# Patient Record
Sex: Male | Born: 1945
Health system: Southern US, Community
[De-identification: ages and names within clinical notes are randomized; demographics above are authoritative.]

## PROBLEM LIST (undated history)

## (undated) DIAGNOSIS — E785 Hyperlipidemia, unspecified: Secondary | ICD-10-CM

## (undated) DIAGNOSIS — K219 Gastro-esophageal reflux disease without esophagitis: Secondary | ICD-10-CM

## (undated) DIAGNOSIS — B019 Varicella without complication: Secondary | ICD-10-CM

## (undated) DIAGNOSIS — J45909 Unspecified asthma, uncomplicated: Secondary | ICD-10-CM

## (undated) DIAGNOSIS — I10 Essential (primary) hypertension: Secondary | ICD-10-CM

## (undated) HISTORY — DX: Gastro-esophageal reflux disease without esophagitis: K21.9

## (undated) HISTORY — DX: Varicella without complication: B01.9

## (undated) HISTORY — PX: CATARACT EXTRACTION: SUR2

## (undated) HISTORY — DX: Unspecified asthma, uncomplicated: J45.909

## (undated) HISTORY — DX: Hyperlipidemia, unspecified: E78.5

## (undated) HISTORY — DX: Essential (primary) hypertension: I10

## (undated) HISTORY — PX: CHOLECYSTECTOMY: SHX55

---

## 2016-11-04 DIAGNOSIS — E785 Hyperlipidemia, unspecified: Secondary | ICD-10-CM

## 2016-11-04 HISTORY — DX: Hyperlipidemia, unspecified: E78.5

## 2016-11-04 LAB — LIPID PANEL
Cholesterol: 209 — AB (ref 0–200)
HDL: 73 — AB (ref 35–70)
LDL Cholesterol: 123
Triglycerides: 65 (ref 40–160)

## 2016-11-04 LAB — HEPATIC FUNCTION PANEL
ALT: 25 (ref 10–40)
AST: 20 (ref 14–40)
Alkaline Phosphatase: 68 (ref 25–125)
Bilirubin, Total: 0.6

## 2016-11-04 LAB — BASIC METABOLIC PANEL
BUN: 11 (ref 4–21)
Creatinine: 0.9 (ref 0.6–1.3)
Glucose: 97
Potassium: 4.7 (ref 3.4–5.3)
Sodium: 140 (ref 137–147)

## 2016-11-04 LAB — TSH: TSH: 2.1 (ref 0.41–5.90)

## 2017-11-17 LAB — BASIC METABOLIC PANEL
BUN: 13 (ref 4–21)
Creatinine: 0.8 (ref 0.6–1.3)
Glucose: 97
Potassium: 5.2 (ref 3.4–5.3)
Sodium: 136 — AB (ref 137–147)

## 2017-11-17 LAB — LIPID PANEL
CHOLESTEROL: 217 — AB (ref 0–200)
HDL: 63 (ref 35–70)
LDL CALC: 134
Triglycerides: 100 (ref 40–160)

## 2017-11-17 LAB — HEPATIC FUNCTION PANEL
ALT: 23 (ref 10–40)
AST: 23 (ref 14–40)
Alkaline Phosphatase: 60 (ref 25–125)
Bilirubin, Total: 0.6

## 2018-05-26 LAB — BASIC METABOLIC PANEL
BUN: 15 (ref 4–21)
Creatinine: 0.9 (ref 0.6–1.3)
Glucose: 95
Potassium: 4.5 (ref 3.4–5.3)
Sodium: 138 (ref 137–147)

## 2018-05-26 LAB — HEPATIC FUNCTION PANEL
ALK PHOS: 63 (ref 25–125)
ALT: 16 (ref 10–40)
AST: 19 (ref 14–40)
Bilirubin, Total: 0.8

## 2018-07-19 ENCOUNTER — Ambulatory Visit (INDEPENDENT_AMBULATORY_CARE_PROVIDER_SITE_OTHER): Payer: Medicare Other | Admitting: Family Medicine

## 2018-07-19 ENCOUNTER — Encounter: Payer: Self-pay | Admitting: Family Medicine

## 2018-07-19 VITALS — BP 130/76 | HR 71 | Ht 67.0 in | Wt 182.4 lb

## 2018-07-19 DIAGNOSIS — G4700 Insomnia, unspecified: Secondary | ICD-10-CM | POA: Insufficient documentation

## 2018-07-19 DIAGNOSIS — I10 Essential (primary) hypertension: Secondary | ICD-10-CM | POA: Diagnosis not present

## 2018-07-19 DIAGNOSIS — R3911 Hesitancy of micturition: Secondary | ICD-10-CM

## 2018-07-19 DIAGNOSIS — R05 Cough: Secondary | ICD-10-CM

## 2018-07-19 DIAGNOSIS — K219 Gastro-esophageal reflux disease without esophagitis: Secondary | ICD-10-CM | POA: Diagnosis not present

## 2018-07-19 DIAGNOSIS — E7800 Pure hypercholesterolemia, unspecified: Secondary | ICD-10-CM | POA: Insufficient documentation

## 2018-07-19 DIAGNOSIS — J45909 Unspecified asthma, uncomplicated: Secondary | ICD-10-CM | POA: Diagnosis not present

## 2018-07-19 DIAGNOSIS — R059 Cough, unspecified: Secondary | ICD-10-CM

## 2018-07-19 DIAGNOSIS — Z0000 Encounter for general adult medical examination without abnormal findings: Secondary | ICD-10-CM

## 2018-07-19 DIAGNOSIS — Z131 Encounter for screening for diabetes mellitus: Secondary | ICD-10-CM | POA: Insufficient documentation

## 2018-07-19 DIAGNOSIS — N401 Enlarged prostate with lower urinary tract symptoms: Secondary | ICD-10-CM

## 2018-07-19 HISTORY — DX: Essential (primary) hypertension: I10

## 2018-07-19 MED ORDER — TAMSULOSIN HCL 0.4 MG PO CAPS: 0.8000 mg | ORAL_CAPSULE | Freq: Every day | ORAL | 0 refills | Status: DC

## 2018-07-19 NOTE — Patient Instructions (Signed)
Health Maintenance After Age 73 After age 73, you are at a higher risk for certain long-term diseases and infections as well as injuries from falls. Falls are a major cause of broken bones and head injuries in people who are older than age 73. Getting regular preventive care can help to keep you healthy and well. Preventive care includes getting regular testing and making lifestyle changes as recommended by your health care provider. Talk with your health care provider about:  Which screenings and tests you should have. A screening is a test that checks for a disease when you have no symptoms.  A diet and exercise plan that is right for you. What should I know about screenings and tests to prevent falls? Screening and testing are the best ways to find a health problem early. Early diagnosis and treatment give you the best chance of managing medical conditions that are common after age 73. Certain conditions and lifestyle choices may make you more likely to have a fall. Your health care provider may recommend:  Regular vision checks. Poor vision and conditions such as cataracts can make you more likely to have a fall. If you wear glasses, make sure to get your prescription updated if your vision changes.  Medicine review. Work with your health care provider to regularly review all of the medicines you are taking, including over-the-counter medicines. Ask your health care provider about any side effects that may make you more likely to have a fall. Tell your health care provider if any medicines that you take make you feel dizzy or sleepy.  Osteoporosis screening. Osteoporosis is a condition that causes the bones to get weaker. This can make the bones weak and cause them to break more easily.  Blood pressure screening. Blood pressure changes and medicines to control blood pressure can make you feel dizzy.  Strength and balance checks. Your health care provider may recommend certain tests to check your  strength and balance while standing, walking, or changing positions.  Foot health exam. Foot pain and numbness, as well as not wearing proper footwear, can make you more likely to have a fall.  Depression screening. You may be more likely to have a fall if you have a fear of falling, feel emotionally low, or feel unable to do activities that you used to do.  Alcohol use screening. Using too much alcohol can affect your balance and may make you more likely to have a fall. What actions can I take to lower my risk of falls? General instructions  Talk with your health care provider about your risks for falling. Tell your health care provider if: ? You fall. Be sure to tell your health care provider about all falls, even ones that seem minor. ? You feel dizzy, sleepy, or off-balance.  Take over-the-counter and prescription medicines only as told by your health care provider. These include any supplements.  Eat a healthy diet and maintain a healthy weight. A healthy diet includes low-fat dairy products, low-fat (lean) meats, and fiber from whole grains, beans, and lots of fruits and vegetables. Home safety  Remove any tripping hazards, such as rugs, cords, and clutter.  Install safety equipment such as grab bars in bathrooms and safety rails on stairs.  Keep rooms and walkways well-lit. Activity   Follow a regular exercise program to stay fit. This will help you maintain your balance. Ask your health care provider what types of exercise are appropriate for you.  If you need a cane or   walker, use it as recommended by your health care provider.  Wear supportive shoes that have nonskid soles. Lifestyle  Do not drink alcohol if your health care provider tells you not to drink.  If you drink alcohol, limit how much you have: ? 0-1 drink a day for women. ? 0-2 drinks a day for men.  Be aware of how much alcohol is in your drink. In the [place] one drink equals one typical bottle of beer (12  oz), one-half glass of wine (5 oz), or one shot of hard liquor (1 oz).  Do not use any products that contain nicotine or tobacco, such as cigarettes and e-cigarettes. If you need help quitting, ask your health care provider. Summary  Having a healthy lifestyle and getting preventive care can help to protect your health and wellness after age 73.  Screening and testing are the best way to find a health problem early and help you avoid having a fall. Early diagnosis and treatment give you the best chance for managing medical conditions that are more common for people who are older than age 73.  Falls are a major cause of broken bones and head injuries in people who are older than age 73. Take precautions to prevent a fall at home.  Work with your health care provider to learn what changes you can make to improve your health and wellness and to prevent falls. This information is not intended to replace advice given to you by your health care provider. Make sure you discuss any questions you have with your health care provider. Document Released: 04/08/2017 Document Revised: 04/08/2017 Document Reviewed: 04/08/2017 Elsevier Interactive Patient Education  2019 [employer]  Benign Prostatic Hyperplasia  Benign prostatic hyperplasia (BPH) is an enlarged prostate gland that is caused by the normal aging process and not by cancer. The prostate is a walnut-sized gland that is involved in the production of semen. It is located in front of the rectum and below the bladder. The bladder stores urine and the urethra is the tube that carries the urine out of the body. The prostate may get bigger as a man gets older. An enlarged prostate can press on the urethra. This can make it harder to pass urine. The build-up of urine in the bladder can cause infection. Back pressure and infection may progress to bladder damage and kidney (renal) failure. What are the causes? This condition is part of a normal aging  process. However, not all men develop problems from this condition. If the prostate enlarges away from the urethra, urine flow will not be blocked. If it enlarges toward the urethra and compresses it, there will be problems passing urine. What increases the risk? This condition is more likely to develop in men over the age of 50 years. What are the signs or symptoms? Symptoms of this condition include:  Getting up often during the night to urinate.  Needing to urinate frequently during the day.  Difficulty starting urine flow.  Decrease in size and strength of your urine stream.  Leaking (dribbling) after urinating.  Inability to pass urine. This needs immediate treatment.  Inability to completely empty your bladder.  Pain when you pass urine. This is more common if there is also an infection.  Urinary tract infection (UTI). How is this diagnosed? This condition is diagnosed based on your medical history, a physical exam, and your symptoms. Tests will also be done, such as:  A post-void bladder scan. This measures any amount of urine  that may remain in your bladder after you finish urinating.  A digital rectal exam. In a rectal exam, your health care provider checks your prostate by putting a lubricated, gloved finger into your rectum to feel the back of your prostate gland. This exam detects the size of your gland and any abnormal lumps or growths.  An exam of your urine (urinalysis).  A prostate specific antigen (PSA) screening. This is a blood test used to screen for prostate cancer.  An ultrasound. This test uses sound waves to electronically produce a picture of your prostate gland. Your health care provider may refer you to a specialist in kidney and prostate diseases (urologist). How is this treated? Once symptoms begin, your health care provider will monitor your condition (active surveillance or watchful waiting). Treatment for this condition will depend on the severity  of your condition. Treatment may include:  Observation and yearly exams. This may be the only treatment needed if your condition and symptoms are mild.  Medicines to relieve your symptoms, including: ? Medicines to shrink the prostate. ? Medicines to relax the muscle of the prostate.  Surgery in severe cases. Surgery may include: ? Prostatectomy. In this procedure, the prostate tissue is removed completely through an open incision or with a laparascope or robotics. ? Transurethral resection of the prostate (TURP). In this procedure, a tool is inserted through the opening at the tip of the penis (urethra). It is used to cut away tissue of the inner core of the prostate. The pieces are removed through the same opening of the penis. This removes the blockage. ? Transurethral incision (TUIP). In this procedure, small cuts are made in the prostate. This lessens the prostate's pressure on the urethra. ? Transurethral microwave thermotherapy (TUMT). This procedure uses microwaves to create heat. The heat destroys and removes a small amount of prostate tissue. ? Transurethral needle ablation (TUNA). This procedure uses radio frequencies to destroy and remove a small amount of prostate tissue. ? Interstitial laser coagulation (ILC). This procedure uses a laser to destroy and remove a small amount of prostate tissue. ? Transurethral electrovaporization (TUVP). This procedure uses electrodes to destroy and remove a small amount of prostate tissue. ? Prostatic urethral lift. This procedure inserts an implant to push the lobes of the prostate away from the urethra. Follow these instructions at home:  Take over-the-counter and prescription medicines only as told by your health care provider.  Monitor your symptoms for any changes. Contact your health care provider with any changes.  Avoid drinking large amounts of liquid before going to bed or out in public.  Avoid or reduce how much caffeine or alcohol  you drink.  Give yourself time when you urinate.  Keep all follow-up visits as told by your health care provider. This is important. Contact a health care provider if:  You have unexplained back pain.  Your symptoms do not get better with treatment.  You develop side effects from the medicine you are taking.  Your urine becomes very dark or has a bad smell.  Your lower abdomen becomes distended and you have trouble passing your urine. Get help right away if:  You have a fever or chills.  You suddenly cannot urinate.  You feel lightheaded, or very dizzy, or you faint.  There are large amounts of blood or clots in the urine.  Your urinary problems become hard to manage.  You develop moderate to severe low back or flank pain. The flank is the side of  your body between the ribs and the hip. These symptoms may represent a serious problem that is an emergency. Do not wait to see if the symptoms will go away. Get medical help right away. Call your local emergency services (911 in the [place] Do not drive yourself to the hospital. Summary  Benign prostatic hyperplasia (BPH) is an enlarged prostate that is caused by the normal aging process and not by cancer.  An enlarged prostate can press on the urethra. This can make it hard to pass urine.  This condition is part of a normal aging process and is more likely to develop in men over the age of 50 years.  Get help right away if you suddenly cannot urinate. This information is not intended to replace advice given to you by your health care provider. Make sure you discuss any questions you have with your health care provider. Document Released: 05/26/2005 Document Revised: 06/30/2016 Document Reviewed: 06/30/2016 Elsevier Interactive Patient Education  2019 [employer].

## 2018-07-19 NOTE — Progress Notes (Signed)
Established Patient Office Visit  Subjective:  Patient ID: Jonathon Johnson, male    DOB: [birth year]  Age: 73 y.o. MRN: [id number]  CC:  Chief Complaint  Patient presents with  . Establish Care    HPI Jonathon Johnson presents for establishment of care and a physical exam.  Blood pressure has been well controlled with the Tenormin 50 mg twice daily and low-dose losartan also taken twice daily.  He tells me that there seemed to be a stress component to his hypertension and his doctor had started him on Elavil 25 mg at night.  He has been on maintenance Advair for reactive airway disease.  His breathing has improved since he started it.  He did not have asthma as a child.  He quit smoking in 2012.  He has no known history of COPD.  He has BPH symptoms with urinary hesitancy.  While on tamsulosin has helped his urine flow.  It is not altogether greatly improved.  He drinks 2 glasses of wine daily.  He quit smoking in 2012.  He lives with his wife.  They are in the process of moving to this area from [place].  3 grown children 1 daughter lives in [place].  He experienced a violent cough last week that was minimally productive of clear phlegm.  He is running no fevers or chills.  It has resolved.  Past Medical History:  Diagnosis Date  . Asthma   . Chicken pox   . GERD (gastroesophageal reflux disease)   . Hyperlipidemia   . Hypertension     Past Surgical History:  Procedure Laterality Date  . CHOLECYSTECTOMY      Family History  Problem Relation Age of Onset  . Alcohol abuse Mother   . Depression Mother   . Diabetes Mother   . Alcohol abuse Father   . COPD Father   . Cancer Sister   . Hypertension Brother     Social History   Socioeconomic History  . Marital status: Married    Spouse name: Not on file  . Number of children: Not on file  . Years of education: Not on file  . Highest education level: Not on file  Occupational History  . Not on file  Social Needs  .  Financial resource strain: Not on file  . Food insecurity:    Worry: Not on file    Inability: Not on file  . Transportation needs:    Medical: Not on file    Non-medical: Not on file  Tobacco Use  . Smoking status: Former Research scientist (life sciences)  . Smokeless tobacco: Never Used  Substance and Sexual Activity  . Alcohol use: Yes  . Drug use: Never  . Sexual activity: Yes    Partners: Female  Lifestyle  . Physical activity:    Days per week: Not on file    Minutes per session: Not on file  . Stress: Not on file  Relationships  . Social connections:    Talks on phone: Not on file    Gets together: Not on file    Attends religious service: Not on file    Active member of club or organization: Not on file    Attends meetings of clubs or organizations: Not on file    Relationship status: Not on file  . Intimate partner violence:    Fear of current or ex partner: Not on file    Emotionally abused: Not on file    Physically abused: Not on  file    Forced sexual activity: Not on file  Other Topics Concern  . Not on file  Social History Narrative  . Not on file    Outpatient Medications Prior to Visit  Medication Sig Dispense Refill  . amitriptyline (ELAVIL) 25 MG tablet Take 25 mg by mouth at bedtime.    [name] Kitchen atenolol (TENORMIN) 50 MG tablet Take 50 mg by mouth 2 (two) times daily.    . fluticasone (FLONASE) 50 MCG/ACT nasal spray Place 2 sprays into both nostrils daily.    . Fluticasone-Salmeterol (ADVAIR) 250-50 MCG/DOSE AEPB Inhale 1 puff into the lungs daily.    [name] Kitchen ibuprofen (ADVIL,MOTRIN) 200 MG tablet Take 200 mg by mouth as needed.    [name] Kitchen losartan (COZAAR) 25 MG tablet Take 25 mg by mouth 2 (two) times daily.    . Multiple Vitamin (MULTIVITAMIN) tablet Take 1 tablet by mouth daily.    [name] Kitchen omeprazole (PRILOSEC) 20 MG capsule Take 20 mg by mouth daily.    . tamsulosin (FLOMAX) 0.4 MG CAPS capsule Take 0.4 mg by mouth daily.     No facility-administered medications prior to visit.      Allergies  Allergen Reactions  . Amoxicillin Diarrhea  . Morphine And Related Nausea And Vomiting    ROS Review of Systems  Constitutional: Negative for chills, diaphoresis, fatigue, fever and unexpected weight change.  HENT: Negative.   Eyes: Negative for photophobia and visual disturbance.  Respiratory: Negative for chest tightness, shortness of breath and wheezing.   Cardiovascular: Negative.   Gastrointestinal: Negative.  Negative for abdominal pain, anal bleeding and blood in stool.  Endocrine: Negative for polyphagia and polyuria.  Genitourinary: Positive for difficulty urinating. Negative for frequency and urgency.  Musculoskeletal: Negative for gait problem.  Skin: Negative for pallor and rash.  Allergic/Immunologic: Negative for immunocompromised state.  Neurological: Negative for seizures, speech difficulty and numbness.  Hematological: Does not bruise/bleed easily.  Psychiatric/Behavioral: Negative.       Objective:    Physical Exam  Constitutional: He is oriented to person, place, and time. He appears well-developed and well-nourished. No distress.  HENT:  Head: Normocephalic and atraumatic.  Right Ear: External ear normal.  Left Ear: External ear normal.  Mouth/Throat: Oropharynx is clear and moist. No oropharyngeal exudate.  Eyes: Pupils are equal, round, and reactive to light. Conjunctivae are normal. Right eye exhibits no discharge. Left eye exhibits no discharge. No scleral icterus.  Neck: Neck supple. No JVD present. No tracheal deviation present. No thyromegaly present.  Cardiovascular: Normal rate, regular rhythm and normal heart sounds.  Pulmonary/Chest: Effort normal and breath sounds normal. No stridor.  Abdominal: Bowel sounds are normal. He exhibits no distension. There is no abdominal tenderness. There is no rebound and no guarding.  Genitourinary: Rectum:     Guaiac result negative.     External hemorrhoid present.     No rectal mass, anal  fissure, tenderness, internal hemorrhoid or abnormal anal tone.  Prostate is enlarged. Prostate is not tender.  Lymphadenopathy:    He has no cervical adenopathy.  Neurological: He is alert and oriented to person, place, and time.  Skin: Skin is warm and dry. He is not diaphoretic.  Psychiatric: He has a normal mood and affect. His behavior is normal.    BP 130/76   Pulse 71   Ht 5' 7" (1.702 m)   Wt 182 lb 6 oz (82.7 kg)   SpO2 94%   BMI 28.56 kg/m  Wt Readings from Last 3  Encounters:  07/19/18 182 lb 6 oz (82.7 kg)   BP Readings from Last 3 Encounters:  07/19/18 130/76   Guideline developer:  UpToDate (see UpToDate for funding source) Date Released: June 2014  Health Maintenance Due  Topic Date Due  . Hepatitis C Screening  1945-11-23  . TETANUS/TDAP  04/12/1965  . COLONOSCOPY  04/12/1996  . PNA vac Low Risk Adult (1 of 2 - PCV13) 04/13/2011  . INFLUENZA VACCINE  01/07/2018    There are no preventive care reminders to display for this patient.  No results found for: TSH No results found for: WBC, HGB, HCT, MCV, PLT No results found for: NA, K, CHLORIDE, CO2, GLUCOSE, BUN, CREATININE, BILITOT, ALKPHOS, AST, ALT, PROT, ALBUMIN, CALCIUM, ANIONGAP, EGFR, GFR No results found for: CHOL No results found for: HDL No results found for: LDLCALC No results found for: TRIG No results found for: CHOLHDL No results found for: HGBA1C    Assessment & Plan:   Problem List Items Addressed This Visit      Cardiovascular and Mediastinum   Essential hypertension - Primary   Relevant Medications   atenolol (TENORMIN) 50 MG tablet   losartan (COZAAR) 25 MG tablet   Other Relevant Orders   CBC   Comprehensive metabolic panel   Urinalysis, Routine w reflex microscopic     Respiratory   Mild reactive airways disease     Digestive   Gastroesophageal reflux disease   Relevant Medications   omeprazole (PRILOSEC) 20 MG capsule     Genitourinary   Benign prostatic  hyperplasia with urinary hesitancy   Relevant Medications   tamsulosin (FLOMAX) 0.4 MG CAPS capsule   Other Relevant Orders   PSA   Urinalysis, Routine w reflex microscopic     Other   Healthcare maintenance   Insomnia   Elevated LDL cholesterol level   Relevant Orders   Lipid panel   Cough   Relevant Orders   DG Chest 2 View   CBC      Meds ordered this encounter  Medications  . tamsulosin (FLOMAX) 0.4 MG CAPS capsule    Sig: Take 2 capsules (0.8 mg total) by mouth daily after breakfast.    Dispense:  180 capsule    Refill:  0    Follow-up: Return in about 3 months (around 10/17/2018).   Patient work will return fasting for above ordered blood work.  Have increased Flomax to .8 mg daily.  He was given information for BPH and health maintenance.  Follow-up in 3 months.

## 2018-07-21 ENCOUNTER — Ambulatory Visit (INDEPENDENT_AMBULATORY_CARE_PROVIDER_SITE_OTHER): Payer: Medicare Other

## 2018-07-21 ENCOUNTER — Other Ambulatory Visit (INDEPENDENT_AMBULATORY_CARE_PROVIDER_SITE_OTHER): Payer: Medicare Other

## 2018-07-21 ENCOUNTER — Other Ambulatory Visit: Payer: Self-pay | Admitting: Family Medicine

## 2018-07-21 DIAGNOSIS — R05 Cough: Secondary | ICD-10-CM | POA: Diagnosis not present

## 2018-07-21 DIAGNOSIS — R3911 Hesitancy of micturition: Secondary | ICD-10-CM | POA: Diagnosis not present

## 2018-07-21 DIAGNOSIS — R059 Cough, unspecified: Secondary | ICD-10-CM

## 2018-07-21 DIAGNOSIS — N401 Enlarged prostate with lower urinary tract symptoms: Secondary | ICD-10-CM

## 2018-07-21 DIAGNOSIS — I10 Essential (primary) hypertension: Secondary | ICD-10-CM

## 2018-07-21 DIAGNOSIS — E7800 Pure hypercholesterolemia, unspecified: Secondary | ICD-10-CM | POA: Diagnosis not present

## 2018-07-21 LAB — URINALYSIS, ROUTINE W REFLEX MICROSCOPIC
Bilirubin Urine: NEGATIVE
Hgb urine dipstick: NEGATIVE
Ketones, ur: NEGATIVE
Leukocytes,Ua: NEGATIVE
NITRITE: NEGATIVE
RBC / HPF: NONE SEEN (ref 0–?)
Specific Gravity, Urine: 1.01 (ref 1.000–1.030)
Total Protein, Urine: NEGATIVE
UROBILINOGEN UA: 0.2 (ref 0.0–1.0)
Urine Glucose: NEGATIVE
WBC, UA: NONE SEEN (ref 0–?)
pH: 6 (ref 5.0–8.0)

## 2018-07-21 LAB — LIPID PANEL
Cholesterol: 174 mg/dL (ref 0–200)
HDL: 56.2 mg/dL (ref >39.00)
LDL Cholesterol: 103 mg/dL — ABNORMAL HIGH (ref 0–99)
NonHDL: 118.1
Total CHOL/HDL Ratio: 3
Triglycerides: 75 mg/dL (ref 0.0–149.0)
VLDL: 15 mg/dL (ref 0.0–40.0)

## 2018-07-21 LAB — CBC
HCT: 43.5 % (ref 39.0–52.0)
HEMOGLOBIN: 15 g/dL (ref 13.0–17.0)
MCHC: 34.5 g/dL (ref 30.0–36.0)
MCV: 90.8 fl (ref 78.0–100.0)
PLATELETS: 384 10*3/uL (ref 150.0–400.0)
RBC: 4.79 Mil/uL (ref 4.22–5.81)
RDW: 12.8 % (ref 11.5–15.5)
WBC: 10.1 10*3/uL (ref 4.0–10.5)

## 2018-07-21 LAB — COMPREHENSIVE METABOLIC PANEL
ALT: 23 U/L (ref 0–53)
AST: 21 U/L (ref 0–37)
Albumin: 3.9 g/dL (ref 3.5–5.2)
Alkaline Phosphatase: 65 U/L (ref 39–117)
BUN: 16 mg/dL (ref 6–23)
CALCIUM: 9.1 mg/dL (ref 8.4–10.5)
CO2: 26 mEq/L (ref 19–32)
Chloride: 99 mEq/L (ref 96–112)
Creatinine, Ser: 0.86 mg/dL (ref 0.40–1.50)
GFR: 87.35 mL/min (ref >60.00)
Glucose, Bld: 95 mg/dL (ref 70–99)
Potassium: 5 mEq/L (ref 3.5–5.1)
Sodium: 136 mEq/L (ref 135–145)
TOTAL PROTEIN: 6.7 g/dL (ref 6.0–8.3)
Total Bilirubin: 0.6 mg/dL (ref 0.2–1.2)

## 2018-07-21 LAB — PSA: PSA: 2.12 ng/mL (ref 0.10–4.00)

## 2018-07-22 ENCOUNTER — Other Ambulatory Visit: Payer: Medicare Other

## 2018-07-27 ENCOUNTER — Encounter: Payer: Self-pay | Admitting: Family Medicine

## 2018-08-02 ENCOUNTER — Encounter: Payer: Self-pay | Admitting: Family Medicine

## 2018-08-22 ENCOUNTER — Encounter: Payer: Self-pay | Admitting: Family Medicine

## 2018-08-23 MED ORDER — AMITRIPTYLINE HCL 25 MG PO TABS: 25.0000 mg | ORAL_TABLET | Freq: Every day | ORAL | 1 refills | Status: DC

## 2018-08-25 ENCOUNTER — Encounter: Payer: Self-pay | Admitting: Family Medicine

## 2018-08-25 MED ORDER — ATENOLOL 50 MG PO TABS: 50.0000 mg | ORAL_TABLET | Freq: Two times a day (BID) | ORAL | 2 refills | Status: DC

## 2018-10-04 ENCOUNTER — Encounter: Payer: Self-pay | Admitting: Family Medicine

## 2018-10-04 MED ORDER — LOSARTAN POTASSIUM 25 MG PO TABS: 25.0000 mg | ORAL_TABLET | Freq: Two times a day (BID) | ORAL | 1 refills | Status: DC

## 2018-10-04 MED ORDER — FLUTICASONE PROPIONATE 50 MCG/ACT NA SUSP: 2.0000 | Freq: Every day | NASAL | 3 refills | Status: DC

## 2018-10-12 ENCOUNTER — Other Ambulatory Visit: Payer: Self-pay | Admitting: Family Medicine

## 2018-10-12 ENCOUNTER — Encounter: Payer: Self-pay | Admitting: Family Medicine

## 2018-10-12 DIAGNOSIS — N401 Enlarged prostate with lower urinary tract symptoms: Secondary | ICD-10-CM

## 2018-10-12 DIAGNOSIS — R3911 Hesitancy of micturition: Principal | ICD-10-CM

## 2018-10-13 ENCOUNTER — Other Ambulatory Visit: Payer: Self-pay

## 2018-10-13 MED ORDER — LOSARTAN POTASSIUM 25 MG PO TABS: 25.0000 mg | ORAL_TABLET | Freq: Two times a day (BID) | ORAL | 1 refills | Status: DC

## 2018-10-13 MED ORDER — FLUTICASONE PROPIONATE 50 MCG/ACT NA SUSP: 2.0000 | Freq: Every day | NASAL | 1 refills | Status: DC

## 2018-11-01 ENCOUNTER — Encounter: Payer: Self-pay | Admitting: Family Medicine

## 2018-11-02 MED ORDER — ATENOLOL 50 MG PO TABS: 50.0000 mg | ORAL_TABLET | Freq: Two times a day (BID) | ORAL | 1 refills | Status: DC

## 2018-11-02 MED ORDER — AMITRIPTYLINE HCL 25 MG PO TABS: 25.0000 mg | ORAL_TABLET | Freq: Every day | ORAL | 1 refills | Status: DC

## 2018-11-02 MED ORDER — OMEPRAZOLE 20 MG PO CPDR: 20.0000 mg | DELAYED_RELEASE_CAPSULE | Freq: Every day | ORAL | 1 refills | Status: DC

## 2018-12-20 ENCOUNTER — Ambulatory Visit (INDEPENDENT_AMBULATORY_CARE_PROVIDER_SITE_OTHER): Payer: Medicare Other | Admitting: Family Medicine

## 2018-12-20 ENCOUNTER — Encounter: Payer: Self-pay | Admitting: Family Medicine

## 2018-12-20 VITALS — BP 149/80 | Temp 97.6°F

## 2018-12-20 DIAGNOSIS — K219 Gastro-esophageal reflux disease without esophagitis: Secondary | ICD-10-CM

## 2018-12-20 DIAGNOSIS — R197 Diarrhea, unspecified: Secondary | ICD-10-CM

## 2018-12-20 DIAGNOSIS — R05 Cough: Secondary | ICD-10-CM

## 2018-12-20 DIAGNOSIS — R059 Cough, unspecified: Secondary | ICD-10-CM

## 2018-12-20 DIAGNOSIS — M26621 Arthralgia of right temporomandibular joint: Secondary | ICD-10-CM

## 2018-12-20 MED ORDER — OMEPRAZOLE 40 MG PO CPDR: 40.0000 mg | DELAYED_RELEASE_CAPSULE | Freq: Every day | ORAL | 1 refills | Status: DC

## 2018-12-20 NOTE — Progress Notes (Signed)
Established Patient Office Visit  Subjective:  Patient ID: Jonathon Johnson, male    DOB: 07/10/1945  Age: 73 y.o. MRN: [id number]  CC:  Chief Complaint  Patient presents with  . GI Problem    HPI Jonathon Johnson presents for 3-4 day pain in right tmj area when opening mouth wide. H/o bruxism per dentist. Using Ibu with some relief. 1 day ho loose stool with fever or chills. No blood or pus in stool. No melena. Some nasal congestion with clear rhinorrhea. Using fluticasone nasal spray daily for 5 years. Ho gerd faily controlled with prilosec 20mg  with occ need for tums. [place]  Using advair daily for rad.   Past Medical History:  Diagnosis Date  . Asthma   . Chicken pox   . GERD (gastroesophageal reflux disease)   . Hyperlipidemia   . Hypertension     Past Surgical History:  Procedure Laterality Date  . CHOLECYSTECTOMY      Family History  Problem Relation Age of Onset  . Alcohol abuse Mother   . Depression Mother   . Diabetes Mother   . Alcohol abuse Father   . COPD Father   . Cancer Sister   . Hypertension Brother     Social History   Socioeconomic History  . Marital status: Married    Spouse name: Not on file  . Number of children: Not on file  . Years of education: Not on file  . Highest education level: Not on file  Occupational History  . Not on file  Social Needs  . Financial resource strain: Not on file  . Food insecurity    Worry: Not on file    Inability: Not on file  . Transportation needs    Medical: Not on file    Non-medical: Not on file  Tobacco Use  . Smoking status: Former Smoker    Quit date: 07/19/2010    Years since quitting: 8.4  . Smokeless tobacco: Never Used  Substance and Sexual Activity  . Alcohol use: Yes    Alcohol/week: 14.0 standard drinks    Types: 14 Glasses of wine per week    Comment: 2 glasses of wine daily  . Drug use: Never  . Sexual activity: Yes    Partners:  Female  Lifestyle  . Physical activity    Days per week: Not on file    Minutes per session: Not on file  . Stress: Not on file  Relationships  . Social Musicianconnections    Talks on phone: Not on file    Gets together: Not on file    Attends religious service: Not on file    Active member of club or organization: Not on file    Attends meetings of clubs or organizations: Not on file    Relationship status: Not on file  . Intimate partner violence    Fear of current or ex partner: Not on file    Emotionally abused: Not on file    Physically abused: Not on file    Forced sexual activity: Not on file  Other Topics Concern  . Not on file  Social History Narrative  . Not on file    Outpatient Medications Prior to Visit  Medication Sig Dispense Refill  . amitriptyline (ELAVIL) 25 MG tablet Take 1 tablet (25 mg total) by mouth at bedtime. 90 tablet 1  . atenolol (TENORMIN) 50 MG tablet Take 1 tablet (50 mg  total) by mouth 2 (two) times daily. 180 tablet 1  . fluticasone (FLONASE) 50 MCG/ACT nasal spray Place 2 sprays into both nostrils daily. 48 g 1  . Fluticasone-Salmeterol (ADVAIR) 250-50 MCG/DOSE AEPB Inhale 1 puff into the lungs daily.    [name] Kitchen. ibuprofen (ADVIL,MOTRIN) 200 MG tablet Take 200 mg by mouth as needed.    [name] Kitchen. losartan (COZAAR) 25 MG tablet Take 1 tablet (25 mg total) by mouth 2 (two) times daily. 180 tablet 1  . Multiple Vitamin (MULTIVITAMIN) tablet Take 1 tablet by mouth daily.    . tamsulosin (FLOMAX) 0.4 MG CAPS capsule TAKE 2 CAPSULES DAILY AFTER BREAKFAST 180 capsule 0  . omeprazole (PRILOSEC) 20 MG capsule Take 1 capsule (20 mg total) by mouth daily. 90 capsule 1   No facility-administered medications prior to visit.     Allergies  Allergen Reactions  . Amoxicillin Diarrhea  . Morphine And Related Nausea And Vomiting    ROS Review of Systems  Constitutional: Negative for chills, diaphoresis, fatigue, fever and unexpected weight change.  HENT: Positive for  congestion, postnasal drip and rhinorrhea. Negative for sinus pressure, sinus pain, sneezing, sore throat, trouble swallowing and voice change.   Eyes: Negative for photophobia and visual disturbance.  Respiratory: Positive for cough. Negative for chest tightness, shortness of breath and wheezing.   Cardiovascular: Negative.   Gastrointestinal: Positive for diarrhea. Negative for abdominal pain, anal bleeding, blood in stool, constipation, nausea and vomiting.  Endocrine: Negative for polyphagia and polyuria.  Genitourinary: Negative.   Musculoskeletal: Positive for arthralgias (right tmj). Negative for myalgias.  Skin: Negative for pallor and rash.  Allergic/Immunologic: Negative for immunocompromised state.  Neurological: Positive for light-headedness.  Hematological: Does not bruise/bleed easily.  Psychiatric/Behavioral: Negative.       Objective:    Physical Exam  Constitutional: He is oriented to person, place, and time. He appears well-developed and well-nourished. No distress.  HENT:  Head: Atraumatic.  Right Ear: External ear normal.  Left Ear: External ear normal.  Eyes: Conjunctivae are normal. Right eye exhibits no discharge. Left eye exhibits no discharge. No scleral icterus.  Neck: No JVD present. No tracheal deviation present.  Pulmonary/Chest: Effort normal.  Neurological: He is alert and oriented to person, place, and time.  Skin: Skin is warm and dry. No rash noted. He is not diaphoretic.  Psychiatric: He has a normal mood and affect. His behavior is normal.    BP (!) 149/80   Temp 97.6 F (36.4 C)   SpO2 95%  Wt Readings from Last 3 Encounters:  07/19/18 182 lb 6 oz (82.7 kg)   BP Readings from Last 3 Encounters:  12/20/18 (!) 149/80  07/19/18 130/76   Guideline developer:  UpToDate (see UpToDate for funding source) Date Released: June 2014  Health Maintenance Due  Topic Date Due  . Hepatitis C Screening  Sep 04, 1945  . PNA vac Low Risk Adult (2 of 2  - PCV13) 05/04/2013    There are no preventive care reminders to display for this patient.  Lab Results  Component Value Date   TSH 2.10 11/04/2016   Lab Results  Component Value Date   WBC 10.1 07/21/2018   HGB 15.0 07/21/2018   HCT 43.5 07/21/2018   MCV 90.8 07/21/2018   PLT 384.0 07/21/2018   Lab Results  Component Value Date   NA 136 07/21/2018   K 5.0 07/21/2018   CO2 26 07/21/2018   GLUCOSE 95 07/21/2018   BUN 16 07/21/2018   CREATININE 0.86  07/21/2018   BILITOT 0.6 07/21/2018   ALKPHOS 65 07/21/2018   AST 21 07/21/2018   ALT 23 07/21/2018   PROT 6.7 07/21/2018   ALBUMIN 3.9 07/21/2018   CALCIUM 9.1 07/21/2018   GFR 87.35 07/21/2018   Lab Results  Component Value Date   CHOL 174 07/21/2018   Lab Results  Component Value Date   HDL 56.20 07/21/2018   Lab Results  Component Value Date   LDLCALC 103 (H) 07/21/2018   Lab Results  Component Value Date   TRIG 75.0 07/21/2018   Lab Results  Component Value Date   CHOLHDL 3 07/21/2018   No results found for: HGBA1C    Assessment & Plan:   Problem List Items Addressed This Visit      Digestive   Gastroesophageal reflux disease   Relevant Medications   omeprazole (PRILOSEC) 40 MG capsule     Musculoskeletal and Integument   Arthralgia of right temporomandibular joint - Primary     Other   Cough   Relevant Medications   omeprazole (PRILOSEC) 40 MG capsule   Diarrhea      Meds ordered this encounter  Medications  . omeprazole (PRILOSEC) 40 MG capsule    Sig: Take 1 capsule (40 mg total) by mouth daily.    Dispense:  90 capsule    Refill:  1    Follow-up: No follow-ups on file.   We will try the higher dose Prilosec to see if that will help with the cough.  He will follow-up in a few days if the diarrhea does not resolve or if he goes on to develop fever or blood in his stool.

## 2019-01-09 ENCOUNTER — Encounter: Payer: Self-pay | Admitting: Family Medicine

## 2019-01-11 ENCOUNTER — Other Ambulatory Visit: Payer: Self-pay

## 2019-01-11 MED ORDER — LOSARTAN POTASSIUM 25 MG PO TABS: 25.0000 mg | ORAL_TABLET | Freq: Two times a day (BID) | ORAL | 1 refills | Status: DC

## 2019-01-11 MED ORDER — FLUTICASONE-SALMETEROL 250-50 MCG/DOSE IN AEPB: 1.0000 | INHALATION_SPRAY | Freq: Every day | RESPIRATORY_TRACT | 1 refills | Status: DC

## 2019-01-18 ENCOUNTER — Encounter: Payer: Self-pay | Admitting: Family Medicine

## 2019-01-18 MED ORDER — FLUTICASONE-SALMETEROL 250-50 MCG/DOSE IN AEPB: 1.0000 | INHALATION_SPRAY | Freq: Two times a day (BID) | RESPIRATORY_TRACT | 3 refills | Status: DC

## 2019-01-18 NOTE — Addendum Note (Signed)
Addended by: Jon Billings on: 01/18/2019 10:58 AM   Modules accepted: Orders

## 2019-01-23 ENCOUNTER — Other Ambulatory Visit: Payer: Self-pay | Admitting: Family Medicine

## 2019-01-23 DIAGNOSIS — N401 Enlarged prostate with lower urinary tract symptoms: Secondary | ICD-10-CM

## 2019-01-23 DIAGNOSIS — R3911 Hesitancy of micturition: Secondary | ICD-10-CM

## 2019-02-08 ENCOUNTER — Encounter: Payer: Self-pay | Admitting: Family Medicine

## 2019-02-08 ENCOUNTER — Telehealth: Payer: Self-pay

## 2019-02-08 NOTE — Telephone Encounter (Signed)

## 2019-02-09 ENCOUNTER — Ambulatory Visit (INDEPENDENT_AMBULATORY_CARE_PROVIDER_SITE_OTHER): Payer: Medicare Other

## 2019-02-09 ENCOUNTER — Other Ambulatory Visit: Payer: Self-pay

## 2019-02-09 ENCOUNTER — Encounter: Payer: Self-pay | Admitting: Family Medicine

## 2019-02-09 DIAGNOSIS — Z23 Encounter for immunization: Secondary | ICD-10-CM

## 2019-02-09 NOTE — Progress Notes (Signed)
Pt came into the office to receive his flu vaccine. Vaccine given in the right deltoid. Pt tolerated injection well and no signs/symptoms of a reaction prior to pt leaving the office. VIS given to pt.

## 2019-03-23 ENCOUNTER — Other Ambulatory Visit: Payer: Self-pay

## 2019-03-23 DIAGNOSIS — K219 Gastro-esophageal reflux disease without esophagitis: Secondary | ICD-10-CM

## 2019-03-23 DIAGNOSIS — R059 Cough, unspecified: Secondary | ICD-10-CM

## 2019-03-23 DIAGNOSIS — R05 Cough: Secondary | ICD-10-CM

## 2019-03-23 MED ORDER — OMEPRAZOLE 40 MG PO CPDR: 40.0000 mg | DELAYED_RELEASE_CAPSULE | Freq: Every day | ORAL | 3 refills | Status: DC

## 2019-04-13 ENCOUNTER — Other Ambulatory Visit: Payer: Self-pay | Admitting: Family Medicine

## 2019-04-25 ENCOUNTER — Other Ambulatory Visit: Payer: Self-pay | Admitting: Family Medicine

## 2019-06-04 ENCOUNTER — Other Ambulatory Visit: Payer: Self-pay | Admitting: Family Medicine

## 2019-06-21 NOTE — Progress Notes (Addendum)
Virtual Visit via Video Note  I connected with patient on 06/22/19 at  9:30 AM EST by audio enabled telemedicine application and verified that I am speaking with the correct person using two identifiers.   THIS ENCOUNTER IS A VIRTUAL VISIT DUE TO COVID-19 - PATIENT WAS NOT SEEN IN THE OFFICE. PATIENT HAS CONSENTED TO VIRTUAL VISIT / TELEMEDICINE VISIT   Location of patient: home  Location of provider: office  I discussed the limitations of evaluation and management by telemedicine and the availability of in person appointments. The patient expressed understanding and agreed to proceed.   Subjective:   Jonathon Johnson is a 74 y.o. male who presents for an Initial Medicare Annual Wellness Visit.  The Patient was informed that the wellness visit is to identify future health risk and educate and initiate measures that can reduce risk for increased disease through the lifespan.   Describes health as fair, good or great? Good.  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  Review of Systems  Home Safety/Smoke Alarms: Feels safe in home. Smoke alarms in place.  Lives with wife in 1 story home.  Male:   CCS- pt reports last done in [place] in 2014. Next due 2024.    PSA-  Lab Results  Component Value Date   PSA 2.12 07/21/2018      Objective:    Today's Vitals   06/22/19 0936  BP: 121/71   There is no height or weight on file to calculate BMI.  Advanced Directives 06/22/2019  Does Patient Have a Medical Advance Directive? No  Does patient want to make changes to medical advance directive? No - Patient declined    Current Medications (verified) Outpatient Encounter Medications as of 06/22/2019  Medication Sig   amitriptyline (ELAVIL) 25 MG tablet TAKE 1 TABLET AT BEDTIME   atenolol (TENORMIN) 50 MG tablet TAKE 1 TABLET TWICE A DAY   fluticasone (FLONASE) 50 MCG/ACT nasal spray USE 2 SPRAYS IN EACH NOSTRIL DAILY   Fluticasone-Salmeterol (ADVAIR DISKUS) 250-50 MCG/DOSE AEPB Inhale  1 puff into the lungs 2 (two) times daily.   ibuprofen (ADVIL,MOTRIN) 200 MG tablet Take 200 mg by mouth as needed.   losartan (COZAAR) 25 MG tablet Take 1 tablet (25 mg total) by mouth 2 (two) times daily.   Multiple Vitamin (MULTIVITAMIN) tablet Take 1 tablet by mouth daily.   omeprazole (PRILOSEC) 40 MG capsule Take 1 capsule (40 mg total) by mouth daily.   tamsulosin (FLOMAX) 0.4 MG CAPS capsule TAKE 2 CAPSULES DAILY AFTER BREAKFAST   No facility-administered encounter medications on file as of 06/22/2019.    Allergies (verified) Amoxicillin and Morphine and related   History: Past Medical History:  Diagnosis Date   Asthma    Chicken pox    GERD (gastroesophageal reflux disease)    Hyperlipidemia    Hypertension    Past Surgical History:  Procedure Laterality Date   CHOLECYSTECTOMY     Family History  Problem Relation Age of Onset   Alcohol abuse Mother    Depression Mother    Diabetes Mother    Alcohol abuse Father    COPD Father    Cancer Sister    Hypertension Brother    Social History   Socioeconomic History   Marital status: Married    Spouse name: Not on file   Number of children: Not on file   Years of education: Not on file   Highest education level: Not on file  Occupational History  Not on file  Tobacco Use   Smoking status: Former Smoker    Quit date: 07/19/2010    Years since quitting: 8.9   Smokeless tobacco: Never Used  Substance and Sexual Activity   Alcohol use: Yes    Alcohol/week: 14.0 standard drinks    Types: 14 Glasses of wine per week    Comment: 3-4 glasses of wine daily   Drug use: Never   Sexual activity: Yes    Partners: Female  Other Topics Concern   Not on file  Social History Narrative   Not on file   Social Determinants of Health   Financial Resource Strain:    Difficulty of Paying Living Expenses: Not on file  Food Insecurity:    Worried About Programme researcher, broadcasting/film/video in the Last Year: Not on file   [employer] of Food in  the Last Year: Not on file  Transportation Needs:    Lack of Transportation (Medical): Not on file   Lack of Transportation (Non-Medical): Not on file  Physical Activity:    Days of Exercise per Week: Not on file   Minutes of Exercise per Session: Not on file  Stress:    Feeling of Stress : Not on file  Social Connections:    Frequency of Communication with Friends and Family: Not on file   Frequency of Social Gatherings with Friends and Family: Not on file   Attends Religious Services: Not on file   Active Member of Clubs or Organizations: Not on file   Attends Banker Meetings: Not on file   Marital Status: Not on file   Tobacco Counseling Counseling given: Not Answered   Clinical Intake: Pain : No/denies pain    Activities of Daily Living In your present state of health, do you have any difficulty performing the following activities: 06/22/2019  Hearing? N  Vision? N  Difficulty concentrating or making decisions? N  Walking or climbing stairs? N  Dressing or bathing? N  Doing errands, shopping? N  Preparing Food and eating ? N  Using the Toilet? N  In the past six months, have you accidently leaked urine? N  Do you have problems with loss of bowel control? N  Managing your Medications? N  Managing your Finances? N  Housekeeping or managing your Housekeeping? N     Immunizations and Health Maintenance Immunization History  Administered Date(s) Administered   Fluad Quad(high Dose 65+) 02/09/2019   Influenza-Unspecified 03/11/2018   Pneumococcal Polysaccharide-23 05/04/2012   Tdap 03/27/2009   Zoster 07/16/2012   Health Maintenance Due  Topic Date Due   Hepatitis C Screening  12/15/1945   PNA vac Low Risk Adult (2 of 2 - PCV13) 05/04/2013   TETANUS/TDAP  03/28/2019    Patient Care Team: Mliss Sax, MD as PCP - General (Family Medicine)  Indicate any recent Medical Services you may have received from other than [facility] providers in the  past year (date may be approximate).    Assessment:   This is a routine wellness examination for Jonathon Johnson. Physical assessment deferred to PCP.  Hearing/Vision screen Unable to assess. This visit is enabled though telemedicine due to Covid 19.   Dietary issues and exercise activities discussed: Current Exercise Habits: Home exercise routine, Type of exercise: stretching, Time (Minutes): 10, Intensity: Mild, Exercise limited by: None identified Diet (meal preparation, eat out, water intake, caffeinated beverages, dairy products, fruits and vegetables): well balanced    Goals      DIET -  INCREASE WATER INTAKE     Increase physical activity       Depression Screen PHQ 2/9 Scores 06/22/2019  PHQ - 2 Score 0    Fall Risk Fall Risk  06/22/2019  Falls in the past year? 0  Number falls in past yr: 0  Injury with Fall? 0  Follow up Education provided;Falls prevention discussed    Cognitive Function: Ad8 score reviewed for issues: Issues making decisions:no Less interest in hobbies / activities:no Repeats questions, stories (family complaining):no Trouble using ordinary gadgets (microwave, computer, phone):no Forgets the month or year: no Mismanaging finances: no Remembering appts:no Daily problems with thinking and/or memory:no Ad8 score is=0         Screening Tests Health Maintenance  Topic Date Due   Hepatitis C Screening  [birth year]   PNA vac Low Risk Adult (2 of 2 - PCV13) 05/04/2013   TETANUS/TDAP  03/28/2019   COLONOSCOPY  12/01/2022   INFLUENZA VACCINE  Completed       Plan:    Please schedule your next medicare wellness visit with me in 1 yr.  Continue to eat heart healthy diet (full of fruits, vegetables, whole grains, lean protein, water--limit salt, fat, and sugar intake) and increase physical activity as tolerated.  Continue doing brain stimulating activities (puzzles, reading, adult coloring books, staying active) to keep memory sharp.   Bring a copy  of your living will and/or healthcare power of attorney to your next office visit.    I have personally reviewed and noted the following in the patient's chart:   Medical and social history Use of alcohol, tobacco or illicit drugs  Current medications and supplements Functional ability and status Nutritional status Physical activity Advanced directives List of other physicians Hospitalizations, surgeries, and ER visits in previous 12 months Vitals Screenings to include cognitive, depression, and falls Referrals and appointments  In addition, I have reviewed and discussed with patient certain preventive protocols, quality metrics, and best practice recommendations. A written personalized care plan for preventive services as well as general preventive health recommendations were provided to patient.     Shela Nevin, [place]   06/22/2019     Reviewed and agree

## 2019-06-22 ENCOUNTER — Ambulatory Visit (INDEPENDENT_AMBULATORY_CARE_PROVIDER_SITE_OTHER): Payer: Medicare Other | Admitting: *Deleted

## 2019-06-22 ENCOUNTER — Encounter: Payer: Self-pay | Admitting: *Deleted

## 2019-06-22 VITALS — BP 121/71

## 2019-06-22 DIAGNOSIS — Z0000 Encounter for general adult medical examination without abnormal findings: Secondary | ICD-10-CM

## 2019-06-22 NOTE — Patient Instructions (Signed)
Please schedule your next medicare wellness visit with me in 1 yr.  Continue to eat heart healthy diet (full of fruits, vegetables, whole grains, lean protein, water--limit salt, fat, and sugar intake) and increase physical activity as tolerated.  Continue doing brain stimulating activities (puzzles, reading, adult coloring books, staying active) to keep memory sharp.   Bring a copy of your living will and/or healthcare power of attorney to your next office visit.   Jonathon Johnson , Thank you for taking time to come for your Medicare Wellness Visit. I appreciate your ongoing commitment to your health goals. Please review the following plan we discussed and let me know if I can assist you in the future.   These are the goals we discussed: Goals    . DIET - INCREASE WATER INTAKE    . Increase physical activity       This is a list of the screening recommended for you and due dates:  Health Maintenance  Topic Date Due  .  Hepatitis C: One time screening is recommended by Center for Disease Control  (CDC) for  adults born from [birth year] through 1965.   [birth year]  . Pneumonia vaccines (2 of 2 - PCV13) 05/04/2013  . Tetanus Vaccine  03/28/2019  . Colon Cancer Screening  12/01/2022  . Flu Shot  Completed    Preventive Care 65 Years and Older, Male Preventive care refers to lifestyle choices and visits with your health care provider that can promote health and wellness. This includes:  A yearly physical exam. This is also called an annual well check.  Regular dental and eye exams.  Immunizations.  Screening for certain conditions.  Healthy lifestyle choices, such as diet and exercise. What can I expect for my preventive care visit? Physical exam Your health care provider will check:  Height and weight. These may be used to calculate body mass index (BMI), which is a measurement that tells if you are at a healthy weight.  Heart rate and blood pressure.  Your skin for abnormal  spots. Counseling Your health care provider may ask you questions about:  Alcohol, tobacco, and drug use.  Emotional well-being.  Home and relationship well-being.  Sexual activity.  Eating habits.  History of falls.  Memory and ability to understand (cognition).  Work and work Statistician. What immunizations do I need?  Influenza (flu) vaccine  This is recommended every year. Tetanus, diphtheria, and pertussis (Tdap) vaccine  You may need a Td booster every 10 years. Varicella (chickenpox) vaccine  You may need this vaccine if you have not already been vaccinated. Zoster (shingles) vaccine  You may need this after age 89. Pneumococcal conjugate (PCV13) vaccine  One dose is recommended after age 31. Pneumococcal polysaccharide (PPSV23) vaccine  One dose is recommended after age 35. Measles, mumps, and rubella (MMR) vaccine  You may need at least one dose of MMR if you were born in 1957 or later. You may also need a second dose. Meningococcal conjugate (MenACWY) vaccine  You may need this if you have certain conditions. Hepatitis A vaccine  You may need this if you have certain conditions or if you travel or work in places where you may be exposed to hepatitis A. Hepatitis B vaccine  You may need this if you have certain conditions or if you travel or work in places where you may be exposed to hepatitis B. Haemophilus influenzae type b (Hib) vaccine  You may need this if you have certain conditions. You may  receive vaccines as individual doses or as more than one vaccine together in one shot (combination vaccines). Talk with your health care provider about the risks and benefits of combination vaccines. What tests do I need? Blood tests  Lipid and cholesterol levels. These may be checked every 5 years, or more frequently depending on your overall health.  Hepatitis C test.  Hepatitis B test. Screening  Lung cancer screening. You may have this screening  every year starting at age 27 if you have a 30-pack-year history of smoking and currently smoke or have quit within the past 15 years.  Colorectal cancer screening. All adults should have this screening starting at age 62 and continuing until age 69. Your health care provider may recommend screening at age 15 if you are at increased risk. You will have tests every 1-10 years, depending on your results and the type of screening test.  Prostate cancer screening. Recommendations will vary depending on your family history and other risks.  Diabetes screening. This is done by checking your blood sugar (glucose) after you have not eaten for a while (fasting). You may have this done every 1-3 years.  Abdominal aortic aneurysm (AAA) screening. You may need this if you are a current or former smoker.  Sexually transmitted disease (STD) testing. Follow these instructions at home: Eating and drinking  Eat a diet that includes fresh fruits and vegetables, whole grains, lean protein, and low-fat dairy products. Limit your intake of foods with high amounts of sugar, saturated fats, and salt.  Take vitamin and mineral supplements as recommended by your health care provider.  Do not drink alcohol if your health care provider tells you not to drink.  If you drink alcohol: ? Limit how much you have to 0-2 drinks a day. ? Be aware of how much alcohol is in your drink. In the [place] one drink equals one 12 oz bottle of beer (355 mL), one 5 oz glass of wine (148 mL), or one 1 oz glass of hard liquor (44 mL). Lifestyle  Take daily care of your teeth and gums.  Stay active. Exercise for at least 30 minutes on 5 or more days each week.  Do not use any products that contain nicotine or tobacco, such as cigarettes, e-cigarettes, and chewing tobacco. If you need help quitting, ask your health care provider.  If you are sexually active, practice safe sex. Use a condom or other form of protection to prevent STIs  (sexually transmitted infections).  Talk with your health care provider about taking a low-dose aspirin or statin. What's next?  Visit your health care provider once a year for a well check visit.  Ask your health care provider how often you should have your eyes and teeth checked.  Stay up to date on all vaccines. This information is not intended to replace advice given to you by your health care provider. Make sure you discuss any questions you have with your health care provider. Document Revised: 05/20/2018 Document Reviewed: 05/20/2018 Elsevier Patient Education  2020 [employer].

## 2019-06-27 ENCOUNTER — Other Ambulatory Visit: Payer: Self-pay

## 2019-06-28 ENCOUNTER — Encounter: Payer: Self-pay | Admitting: Family Medicine

## 2019-06-28 ENCOUNTER — Ambulatory Visit (INDEPENDENT_AMBULATORY_CARE_PROVIDER_SITE_OTHER): Payer: Medicare Other | Admitting: Family Medicine

## 2019-06-28 VITALS — BP 138/76 | HR 67 | Temp 96.1°F | Ht 67.0 in | Wt 194.8 lb

## 2019-06-28 DIAGNOSIS — M79644 Pain in right finger(s): Secondary | ICD-10-CM | POA: Diagnosis not present

## 2019-06-28 DIAGNOSIS — R3911 Hesitancy of micturition: Secondary | ICD-10-CM | POA: Diagnosis not present

## 2019-06-28 DIAGNOSIS — Z0000 Encounter for general adult medical examination without abnormal findings: Secondary | ICD-10-CM | POA: Diagnosis not present

## 2019-06-28 DIAGNOSIS — I10 Essential (primary) hypertension: Secondary | ICD-10-CM

## 2019-06-28 DIAGNOSIS — G4700 Insomnia, unspecified: Secondary | ICD-10-CM

## 2019-06-28 DIAGNOSIS — Z23 Encounter for immunization: Secondary | ICD-10-CM

## 2019-06-28 DIAGNOSIS — E7800 Pure hypercholesterolemia, unspecified: Secondary | ICD-10-CM

## 2019-06-28 DIAGNOSIS — N401 Enlarged prostate with lower urinary tract symptoms: Secondary | ICD-10-CM | POA: Diagnosis not present

## 2019-06-28 LAB — URINALYSIS, ROUTINE W REFLEX MICROSCOPIC
Bilirubin Urine: NEGATIVE
Hgb urine dipstick: NEGATIVE
Ketones, ur: NEGATIVE
Leukocytes,Ua: NEGATIVE
Nitrite: NEGATIVE
RBC / HPF: NONE SEEN (ref 0–?)
Specific Gravity, Urine: 1.02 (ref 1.000–1.030)
Total Protein, Urine: NEGATIVE
Urine Glucose: NEGATIVE
Urobilinogen, UA: 0.2 (ref 0.0–1.0)
WBC, UA: NONE SEEN (ref 0–?)
pH: 6 (ref 5.0–8.0)

## 2019-06-28 LAB — COMPREHENSIVE METABOLIC PANEL WITH GFR
ALT: 32 U/L (ref 0–53)
AST: 29 U/L (ref 0–37)
Albumin: 4 g/dL (ref 3.5–5.2)
Alkaline Phosphatase: 60 U/L (ref 39–117)
BUN: 17 mg/dL (ref 6–23)
CO2: 28 meq/L (ref 19–32)
Calcium: 9 mg/dL (ref 8.4–10.5)
Chloride: 102 meq/L (ref 96–112)
Creatinine, Ser: 0.96 mg/dL (ref 0.40–1.50)
GFR: 76.73 mL/min
Glucose, Bld: 92 mg/dL (ref 70–99)
Potassium: 4 meq/L (ref 3.5–5.1)
Sodium: 137 meq/L (ref 135–145)
Total Bilirubin: 0.5 mg/dL (ref 0.2–1.2)
Total Protein: 6.9 g/dL (ref 6.0–8.3)

## 2019-06-28 LAB — LIPID PANEL
Cholesterol: 237 mg/dL — ABNORMAL HIGH (ref 0–200)
HDL: 65.4 mg/dL
LDL Cholesterol: 143 mg/dL — ABNORMAL HIGH (ref 0–99)
NonHDL: 171.61
Total CHOL/HDL Ratio: 4
Triglycerides: 144 mg/dL (ref 0.0–149.0)
VLDL: 28.8 mg/dL (ref 0.0–40.0)

## 2019-06-28 LAB — CBC
HCT: 42.8 % (ref 39.0–52.0)
Hemoglobin: 14.5 g/dL (ref 13.0–17.0)
MCHC: 33.8 g/dL (ref 30.0–36.0)
MCV: 95.1 fl (ref 78.0–100.0)
Platelets: 265 10*3/uL (ref 150.0–400.0)
RBC: 4.5 Mil/uL (ref 4.22–5.81)
RDW: 13 % (ref 11.5–15.5)
WBC: 5.7 10*3/uL (ref 4.0–10.5)

## 2019-06-28 LAB — PSA: PSA: 1.78 ng/mL (ref 0.10–4.00)

## 2019-06-28 MED ORDER — FINASTERIDE 5 MG PO TABS: 5.0000 mg | ORAL_TABLET | Freq: Every day | ORAL | 1 refills | Status: DC

## 2019-06-28 MED ORDER — TAMSULOSIN HCL 0.4 MG PO CAPS: ORAL_CAPSULE | ORAL | 3 refills | Status: DC

## 2019-06-28 MED ORDER — DICLOFENAC SODIUM 1 % EX GEL: CUTANEOUS | 2 refills | Status: DC

## 2019-06-28 NOTE — Patient Instructions (Signed)
Benign Prostatic Hyperplasia  Benign prostatic hyperplasia (BPH) is an enlarged prostate gland that is caused by the normal aging process and not by cancer. The prostate is a walnut-sized gland that is involved in the production of semen. It is located in front of the rectum and below the bladder. The bladder stores urine and the urethra is the tube that carries the urine out of the body. The prostate may get bigger as a man gets older. An enlarged prostate can press on the urethra. This can make it harder to pass urine. The build-up of urine in the bladder can cause infection. Back pressure and infection may progress to bladder damage and kidney (renal) failure. What are the causes? This condition is part of a normal aging process. However, not all men develop problems from this condition. If the prostate enlarges away from the urethra, urine flow will not be blocked. If it enlarges toward the urethra and compresses it, there will be problems passing urine. What increases the risk? This condition is more likely to develop in men over the age of 50 years. What are the signs or symptoms? Symptoms of this condition include:  Getting up often during the night to urinate.  Needing to urinate frequently during the day.  Difficulty starting urine flow.  Decrease in size and strength of your urine stream.  Leaking (dribbling) after urinating.  Inability to pass urine. This needs immediate treatment.  Inability to completely empty your bladder.  Pain when you pass urine. This is more common if there is also an infection.  Urinary tract infection (UTI). How is this diagnosed? This condition is diagnosed based on your medical history, a physical exam, and your symptoms. Tests will also be done, such as:  A post-void bladder scan. This measures any amount of urine that may remain in your bladder after you finish urinating.  A digital rectal exam. In a rectal exam, your health care provider  checks your prostate by putting a lubricated, gloved finger into your rectum to feel the back of your prostate gland. This exam detects the size of your gland and any abnormal lumps or growths.  An exam of your urine (urinalysis).  A prostate specific antigen (PSA) screening. This is a blood test used to screen for prostate cancer.  An ultrasound. This test uses sound waves to electronically produce a picture of your prostate gland. Your health care provider may refer you to a specialist in kidney and prostate diseases (urologist). How is this treated? Once symptoms begin, your health care provider will monitor your condition (active surveillance or watchful waiting). Treatment for this condition will depend on the severity of your condition. Treatment may include:  Observation and yearly exams. This may be the only treatment needed if your condition and symptoms are mild.  Medicines to relieve your symptoms, including: ? Medicines to shrink the prostate. ? Medicines to relax the muscle of the prostate.  Surgery in severe cases. Surgery may include: ? Prostatectomy. In this procedure, the prostate tissue is removed completely through an open incision or with a laparoscope or robotics. ? Transurethral resection of the prostate (TURP). In this procedure, a tool is inserted through the opening at the tip of the penis (urethra). It is used to cut away tissue of the inner core of the prostate. The pieces are removed through the same opening of the penis. This removes the blockage. ? Transurethral incision (TUIP). In this procedure, small cuts are made in the prostate. This lessens   the prostate's pressure on the urethra. ? Transurethral microwave thermotherapy (TUMT). This procedure uses microwaves to create heat. The heat destroys and removes a small amount of prostate tissue. ? Transurethral needle ablation (TUNA). This procedure uses radio frequencies to destroy and remove a small amount of  prostate tissue. ? Interstitial laser coagulation (ILC). This procedure uses a laser to destroy and remove a small amount of prostate tissue. ? Transurethral electrovaporization (TUVP). This procedure uses electrodes to destroy and remove a small amount of prostate tissue. ? Prostatic urethral lift. This procedure inserts an implant to push the lobes of the prostate away from the urethra. Follow these instructions at home:  Take over-the-counter and prescription medicines only as told by your health care provider.  Monitor your symptoms for any changes. Contact your health care provider with any changes.  Avoid drinking large amounts of liquid before going to bed or out in public.  Avoid or reduce how much caffeine or alcohol you drink.  Give yourself time when you urinate.  Keep all follow-up visits as told by your health care provider. This is important. Contact a health care provider if:  You have unexplained back pain.  Your symptoms do not get better with treatment.  You develop side effects from the medicine you are taking.  Your urine becomes very dark or has a bad smell.  Your lower abdomen becomes distended and you have trouble passing your urine. Get help right away if:  You have a fever or chills.  You suddenly cannot urinate.  You feel lightheaded, or very dizzy, or you faint.  There are large amounts of blood or clots in the urine.  Your urinary problems become hard to manage.  You develop moderate to severe low back or flank pain. The flank is the side of your body between the ribs and the hip. These symptoms may represent a serious problem that is an emergency. Do not wait to see if the symptoms will go away. Get medical help right away. Call your local emergency services (911 in the [place] Do not drive yourself to the hospital. Summary  Benign prostatic hyperplasia (BPH) is an enlarged prostate that is caused by the normal aging process and not by cancer.   An enlarged prostate can press on the urethra. This can make it hard to pass urine.  This condition is part of a normal aging process and is more likely to develop in men over the age of 50 years.  Get help right away if you suddenly cannot urinate. This information is not intended to replace advice given to you by your health care provider. Make sure you discuss any questions you have with your health care provider. Document Revised: 04/20/2018 Document Reviewed: 06/30/2016 Elsevier Patient Education  2020 Else[employer] Inc. Finasteride (Proscar) tablets What is this medicine? FINASTERIDE (fi NAS teer ide) is used to treat benign prostatic hyperplasia (BPH) in men. This is a condition that causes you to have an enlarged prostate. This medicine helps to control your symptoms, decrease urinary retention, and reduces your risk of needing surgery. When used in combination with certain other medicines, this drug can slow down the progression of your disease. This medicine may be used for other purposes; ask your health care provider or pharmacist if you have questions. COMMON BRAND NAME(S): Proscar What should I tell my health care provider before I take this medicine? They need to know if you have any of these conditions:  liver disease  an unusual or  allergic reaction to finasteride, other medicines, foods, dyes, or preservatives  pregnant or trying to get pregnant  breast-feeding How should I use this medicine? Take this medicine by mouth with a glass of water. Follow the directions on the prescription label. You can take this medicine with or without food. Take your doses at regular intervals. Do not take your medicine more often than directed. Do not stop taking except on the advice of your doctor or health care professional. Talk to your pediatrician regarding the use of this medicine in children. Special care may be needed. Overdosage: If you think you have taken too much of this medicine  contact a poison control center or emergency room at once. NOTE: This medicine is only for you. Do not share this medicine with others. What if I miss a dose? If you miss a dose, take it as soon as you can. If it is almost time for your next dose, take only that dose. Do not take double or extra doses. What may interact with this medicine?  saw palmetto or other dietary supplements This list may not describe all possible interactions. Give your health care provider a list of all the medicines, herbs, non-prescription drugs, or dietary supplements you use. Also tell them if you smoke, drink alcohol, or use illegal drugs. Some items may interact with your medicine. What should I watch for while using this medicine? Do not donate blood while you are taking this medicine. This will prevent giving this medicine to a pregnant male through a blood transfusion. Ask your doctor or health care professional when it is safe to donate blood after you stop taking this medicine. Women who are pregnant or may get pregnant must not handle broken or crushed finasteride tablets. The active ingredient could harm the unborn baby. If a pregnant woman comes into contact with broken or crushed tablets she should check with her doctor or health care professional. Exposure to whole tablets is not expected to cause harm as long as they are not swallowed. Contact your doctor or health care professional if your symptoms do not start to get better. You may need to take this medicine for 6 to 12 months to get the best results. This medicine can interfere with PSA laboratory tests for prostate cancer. If you are scheduled to have a lab test for prostate cancer, tell your doctor or health care professional that you are taking this medicine. This medicine may increase your risk of getting some cancers, like breast cancer. Talk with your doctor. What side effects may I notice from receiving this medicine? Side effects that you should  report to your doctor or health care professional as soon as possible:  any signs of an allergic reaction like rash, itching, hives or swelling of the lips or face  changes in breast like lumps, pain or fluids leaking from the nipple  pain in the testicles Side effects that usually do not require medical attention (report to your doctor or health care professional if they continue or are bothersome):  sexual difficulties like decreased sexual desire or ability to get an erection  small amount of semen released during sex This list may not describe all possible side effects. Call your doctor for medical advice about side effects. You may report side effects to FDA at 1-800-FDA-1088. Where should I keep my medicine? Keep out of the reach of children. Store at room temperature below 30 degrees C (86 degrees F). Protect from light. Keep container tightly closed.  Throw away any unused medicine after the expiration date. NOTE: This sheet is a summary. It may not cover all possible information. If you have questions about this medicine, talk to your doctor, pharmacist, or health care provider.  2020 Elsevier/Gold Standard (2015-01-11 17:24:30)

## 2019-06-28 NOTE — Progress Notes (Addendum)
Established Patient Office Visit  Subjective:  Patient ID: Jonathon Johnson, male    DOB: [birth year]  Age: 74 y.o. MRN: [id number]  CC:  Chief Complaint  Patient presents with  . Pain    c/o of issues with gripping things, pain in thumbs for years would like this checked, would like to talk about Hep -C screening    HPI Jonathon Johnson presents for follow-up of his BPH.  Symptoms have not been particularly relieved by doubling the Fosamax.  Continues to experience nocturia decreased urine flow.  Has been having pain in his right greater than left first MC P and carpal metacarpal joints.  Blood pressures been controlled on his current regimen.  GERD were improved with doubling the omeprazole.  Would like to be screened for hep C  Past Medical History:  Diagnosis Date  . Asthma   . Chicken pox   . GERD (gastroesophageal reflux disease)   . Hyperlipidemia   . Hypertension     Past Surgical History:  Procedure Laterality Date  . CHOLECYSTECTOMY      Family History  Problem Relation Age of Onset  . Alcohol abuse Mother   . Depression Mother   . Diabetes Mother   . Alcohol abuse Father   . COPD Father   . Cancer Sister   . Hypertension Brother     Social History   Socioeconomic History  . Marital status: Married    Spouse name: Not on file  . Number of children: Not on file  . Years of education: Not on file  . Highest education level: Not on file  Occupational History  . Not on file  Tobacco Use  . Smoking status: Former Smoker    Quit date: 07/19/2010    Years since quitting: 8.9  . Smokeless tobacco: Never Used  Substance and Sexual Activity  . Alcohol use: Yes    Alcohol/week: 14.0 standard drinks    Types: 14 Glasses of wine per week    Comment: 3-4 glasses of wine daily  . Drug use: Never  . Sexual activity: Yes    Partners: Female  Other Topics Concern  . Not on file  Social History Narrative  . Not on file   Social Determinants of Health   Financial  Resource Strain:   . Difficulty of Paying Living Expenses: Not on file  Food Insecurity:   . Worried About Programme researcher, broadcasting/film/video in the Last Year: Not on file  . Ran Out of Food in the Last Year: Not on file  Transportation Needs:   . Lack of Transportation (Medical): Not on file  . Lack of Transportation (Non-Medical): Not on file  Physical Activity:   . Days of Exercise per Week: Not on file  . Minutes of Exercise per Session: Not on file  Stress:   . Feeling of Stress : Not on file  Social Connections:   . Frequency of Communication with Friends and Family: Not on file  . Frequency of Social Gatherings with Friends and Family: Not on file  . Attends Religious Services: Not on file  . Active Member of Clubs or Organizations: Not on file  . Attends Banker Meetings: Not on file  . Marital Status: Not on file  Intimate Partner Violence:   . Fear of Current or Ex-Partner: Not on file  . Emotionally Abused: Not on file  . Physically Abused: Not on file  . Sexually Abused: Not on file    Outpatient  Medications Prior to Visit  Medication Sig Dispense Refill  . amitriptyline (ELAVIL) 25 MG tablet TAKE 1 TABLET AT BEDTIME 90 tablet 1  . atenolol (TENORMIN) 50 MG tablet TAKE 1 TABLET TWICE A DAY 180 tablet 3  . fluticasone (FLONASE) 50 MCG/ACT nasal spray USE 2 SPRAYS IN EACH NOSTRIL DAILY 48 g 3  . Fluticasone-Salmeterol (ADVAIR DISKUS) 250-50 MCG/DOSE AEPB Inhale 1 puff into the lungs 2 (two) times daily. 3 each 3  . ibuprofen (ADVIL,MOTRIN) 200 MG tablet Take 200 mg by mouth as needed.    [name] Kitchen losartan (COZAAR) 25 MG tablet Take 1 tablet (25 mg total) by mouth 2 (two) times daily. 180 tablet 1  . Multiple Vitamin (MULTIVITAMIN) tablet Take 1 tablet by mouth daily.    [name] Kitchen omeprazole (PRILOSEC) 40 MG capsule Take 1 capsule (40 mg total) by mouth daily. 90 capsule 3  . tamsulosin (FLOMAX) 0.4 MG CAPS capsule TAKE 2 CAPSULES DAILY AFTER BREAKFAST 180 capsule 3   No  facility-administered medications prior to visit.    Allergies  Allergen Reactions  . Amoxicillin Diarrhea  . Morphine And Related Nausea And Vomiting    ROS Review of Systems  Constitutional: Negative.   HENT: Negative.   Eyes: Negative for photophobia and visual disturbance.  Respiratory: Negative.   Cardiovascular: Negative.   Gastrointestinal: Negative.   Endocrine: Negative for polyphagia and polyuria.  Genitourinary: Positive for difficulty urinating, frequency and urgency.  Musculoskeletal: Positive for arthralgias. Negative for gait problem.  Skin: Negative for color change and pallor.  Allergic/Immunologic: Negative for immunocompromised state.  Neurological: Negative for tremors and speech difficulty.  Hematological: Does not bruise/bleed easily.  Psychiatric/Behavioral: Negative.       Objective:    Physical Exam  Constitutional: He is oriented to person, place, and time. He appears well-developed and well-nourished. No distress.  HENT:  Head: Normocephalic and atraumatic.  Right Ear: External ear normal.  Left Ear: External ear normal.  Eyes: Conjunctivae are normal. Right eye exhibits no discharge. Left eye exhibits no discharge. No scleral icterus.  Neck: No JVD present. No tracheal deviation present.  Cardiovascular: Normal rate, regular rhythm and normal heart sounds.  Pulmonary/Chest: Effort normal and breath sounds normal. No stridor.  Abdominal: Bowel sounds are normal.  Musculoskeletal:        General: No edema.       Hands:  Neurological: He is alert and oriented to person, place, and time.  Skin: Skin is warm and dry. He is not diaphoretic.  Psychiatric: He has a normal mood and affect. His behavior is normal.    BP 138/76   Pulse 67   Temp (!) 96.1 F (35.6 C) (Tympanic)   Ht 5\' 7"  (1.702 m)   Wt 194 lb 12.8 oz (88.4 kg)   SpO2 96%   BMI 30.51 kg/m  Wt Readings from Last 3 Encounters:  06/28/19 194 lb 12.8 oz (88.4 kg)  07/19/18 182  lb 6 oz (82.7 kg)     There are no preventive care reminders to display for this patient.  There are no preventive care reminders to display for this patient.  Lab Results  Component Value Date   TSH 2.10 11/04/2016   Lab Results  Component Value Date   WBC 5.7 06/28/2019   HGB 14.5 06/28/2019   HCT 42.8 06/28/2019   MCV 95.1 06/28/2019   PLT 265.0 06/28/2019   Lab Results  Component Value Date   NA 137 06/28/2019   K 4.0 06/28/2019  CO2 28 06/28/2019   GLUCOSE 92 06/28/2019   BUN 17 06/28/2019   CREATININE 0.96 06/28/2019   BILITOT 0.5 06/28/2019   ALKPHOS 60 06/28/2019   AST 29 06/28/2019   ALT 32 06/28/2019   PROT 6.9 06/28/2019   ALBUMIN 4.0 06/28/2019   CALCIUM 9.0 06/28/2019   GFR 76.73 06/28/2019   Lab Results  Component Value Date   CHOL 237 (H) 06/28/2019   Lab Results  Component Value Date   HDL 65.40 06/28/2019   Lab Results  Component Value Date   LDLCALC 143 (H) 06/28/2019   Lab Results  Component Value Date   TRIG 144.0 06/28/2019   Lab Results  Component Value Date   CHOLHDL 4 06/28/2019   No results found for: HGBA1C The 10-year ASCVD risk score Denman George DC Jr., et al., 2013) is: 27.4%   Values used to calculate the score:     Age: 22 years     Sex: Male     Is Non-Hispanic African American: No     Diabetic: No     Tobacco smoker: No     Systolic Blood Pressure: 138 mmHg     Is BP treated: Yes     HDL Cholesterol: 65.4 mg/dL     Total Cholesterol: 237 mg/dL   Assessment & Plan:   Problem List Items Addressed This Visit      Cardiovascular and Mediastinum   Essential hypertension   Relevant Orders   CBC (Completed)   Comprehensive metabolic panel (Completed)   Ambulatory referral to Ophthalmology   Ambulatory referral to Ophthalmology     Genitourinary   Benign prostatic hyperplasia with urinary hesitancy   Relevant Medications   finasteride (PROSCAR) 5 MG tablet   tamsulosin (FLOMAX) 0.4 MG CAPS capsule   Other  Relevant Orders   PSA (Completed)     Other   Healthcare maintenance - Primary   Relevant Orders   Comprehensive metabolic panel (Completed)   Hepatitis C antibody (Completed)   Lipid panel (Completed)   Urinalysis, Routine w reflex microscopic (Completed)   Ambulatory referral to Ophthalmology   Insomnia   Elevated LDL cholesterol level   Relevant Orders   Comprehensive metabolic panel (Completed)   Lipid panel (Completed)   Thumb pain, right   Relevant Medications   diclofenac Sodium (VOLTAREN) 1 % GEL    Other Visit Diagnoses    Need for pneumococcal vaccine       Relevant Orders   Pneumococcal conjugate vaccine 13-valent IM (Completed)   Need for diphtheria-tetanus-pertussis (Tdap) vaccine       Relevant Orders   Tdap vaccine greater than or equal to 7yo IM (Completed)      Meds ordered this encounter  Medications  . finasteride (PROSCAR) 5 MG tablet    Sig: Take 1 tablet (5 mg total) by mouth daily.    Dispense:  90 tablet    Refill:  1  . diclofenac Sodium (VOLTAREN) 1 % GEL    Sig: Apply a small amount to painful joints on hand three times daily as needed.    Dispense:  100 g    Refill:  2  . tamsulosin (FLOMAX) 0.4 MG CAPS capsule    Sig: TAKE 2 CAPSULES DAILY AFTER BREAKFAST    Dispense:  180 capsule    Refill:  3    Follow-up: Return in about 3 months (around 09/26/2019).   We will continue Flomax and start finasteride.  Patient was given information on Proscar and BPH.  We will try Voltaren gel for the pain in his right first MCP.  Continue all other medications. Libby Maw, MD

## 2019-06-29 LAB — HEPATITIS C ANTIBODY
Hepatitis C Ab: NONREACTIVE
SIGNAL TO CUT-OFF: 0.01 (ref <1.00)

## 2019-06-29 NOTE — Progress Notes (Signed)
Hep C was negative.

## 2019-07-08 ENCOUNTER — Encounter: Payer: Self-pay | Admitting: Family Medicine

## 2019-07-08 NOTE — Addendum Note (Signed)
Addended by: Andrez Grime on: 07/08/2019 04:37 PM   Modules accepted: Orders

## 2019-07-24 ENCOUNTER — Ambulatory Visit: Payer: Medicare Other | Attending: Internal Medicine

## 2019-07-24 DIAGNOSIS — Z23 Encounter for immunization: Secondary | ICD-10-CM | POA: Insufficient documentation

## 2019-07-24 NOTE — Progress Notes (Signed)
   Covid-19 Vaccination Clinic  Name:  Jonathon Johnson    MRN: [id number] DOB: [birth year]  07/24/2019  [name] was observed post Covid-19 immunization for 15 minutes without incidence. He was provided with Vaccine Information Sheet and instruction to access the V-Safe system.   [name] was instructed to call 911 with any severe reactions post vaccine: [name] Kitchen Difficulty breathing  . Swelling of your face and throat  . A fast heartbeat  . A bad rash all over your body  . Dizziness and weakness    Immunizations Administered    Name Date Dose VIS Date Route   Pfizer COVID-19 Vaccine 07/24/2019 11:10 AM 0.3 mL 05/20/2019 Intramuscular   Manufacturer: [employer], [employer]   Lot: [id number]   NDC: [address]-9

## 2019-07-25 DIAGNOSIS — H2513 Age-related nuclear cataract, bilateral: Secondary | ICD-10-CM | POA: Diagnosis not present

## 2019-07-25 DIAGNOSIS — H35033 Hypertensive retinopathy, bilateral: Secondary | ICD-10-CM | POA: Diagnosis not present

## 2019-08-16 ENCOUNTER — Ambulatory Visit: Payer: Medicare Other | Attending: Internal Medicine

## 2019-08-16 DIAGNOSIS — Z23 Encounter for immunization: Secondary | ICD-10-CM

## 2019-08-16 NOTE — Progress Notes (Signed)
   Covid-19 Vaccination Clinic  Name:  Jonathon Johnson    MRN: [id number] DOB: December 12, 1945  08/16/2019  [name] was observed post Covid-19 immunization for 15 minutes without incident. He was provided with Vaccine Information Sheet and instruction to access the V-Safe system.   [name] was instructed to call 911 with any severe reactions post vaccine: [name] Kitchen Difficulty breathing  . Swelling of face and throat  . A fast heartbeat  . A bad rash all over body  . Dizziness and weakness   Immunizations Administered    Name Date Dose VIS Date Route   Pfizer COVID-19 Vaccine 08/16/2019  1:29 PM 0.3 mL 05/20/2019 Intramuscular   Manufacturer: [employer], [employer]   Lot: [id number]   NDC: [address]-8

## 2019-09-23 ENCOUNTER — Other Ambulatory Visit: Payer: Self-pay | Admitting: Family Medicine

## 2019-09-23 NOTE — Telephone Encounter (Signed)
Last oV 06/28/19 Last fill 04/13/19  #90/1

## 2019-11-13 ENCOUNTER — Other Ambulatory Visit: Payer: Self-pay | Admitting: Family Medicine

## 2019-11-13 DIAGNOSIS — R3911 Hesitancy of micturition: Secondary | ICD-10-CM

## 2019-11-28 ENCOUNTER — Other Ambulatory Visit: Payer: Self-pay

## 2019-11-29 ENCOUNTER — Encounter: Payer: Self-pay | Admitting: Family Medicine

## 2019-11-29 ENCOUNTER — Ambulatory Visit (INDEPENDENT_AMBULATORY_CARE_PROVIDER_SITE_OTHER): Payer: Medicare Other | Admitting: Family Medicine

## 2019-11-29 VITALS — BP 146/82 | HR 61 | Temp 97.5°F | Ht 67.0 in | Wt 191.6 lb

## 2019-11-29 DIAGNOSIS — I10 Essential (primary) hypertension: Secondary | ICD-10-CM

## 2019-11-29 DIAGNOSIS — E7800 Pure hypercholesterolemia, unspecified: Secondary | ICD-10-CM

## 2019-11-29 LAB — BASIC METABOLIC PANEL
BUN: 17 mg/dL (ref 6–23)
CO2: 26 mEq/L (ref 19–32)
Calcium: 9.3 mg/dL (ref 8.4–10.5)
Chloride: 105 mEq/L (ref 96–112)
Creatinine, Ser: 0.92 mg/dL (ref 0.40–1.50)
GFR: 80.5 mL/min (ref >60.00)
Glucose, Bld: 93 mg/dL (ref 70–99)
Potassium: 4.5 mEq/L (ref 3.5–5.1)
Sodium: 140 mEq/L (ref 135–145)

## 2019-11-29 LAB — LIPID PANEL
Cholesterol: 188 mg/dL (ref 0–200)
HDL: 65.4 mg/dL (ref >39.00)
LDL Cholesterol: 108 mg/dL — ABNORMAL HIGH (ref 0–99)
NonHDL: 122.24
Total CHOL/HDL Ratio: 3
Triglycerides: 71 mg/dL (ref 0.0–149.0)
VLDL: 14.2 mg/dL (ref 0.0–40.0)

## 2019-11-29 LAB — LDL CHOLESTEROL, DIRECT: Direct LDL: 108 mg/dL

## 2019-11-29 MED ORDER — METOPROLOL TARTRATE 50 MG PO TABS: 50.0000 mg | ORAL_TABLET | Freq: Two times a day (BID) | ORAL | 3 refills | Status: DC

## 2019-11-29 MED ORDER — LOSARTAN POTASSIUM 100 MG PO TABS: 100.0000 mg | ORAL_TABLET | Freq: Every day | ORAL | 3 refills | Status: DC

## 2019-11-29 NOTE — Patient Instructions (Addendum)
Managing Your Hypertension Hypertension is commonly called high blood pressure. This is when the force of your blood pressing against the walls of your arteries is too strong. Arteries are blood vessels that carry blood from your heart throughout your body. Hypertension forces the heart to work harder to pump blood, and may cause the arteries to become narrow or stiff. Having untreated or uncontrolled hypertension can cause heart attack, stroke, kidney disease, and other problems. What are blood pressure readings? A blood pressure reading consists of a higher number over a lower number. Ideally, your blood pressure should be below 120/80. The first ("top") number is called the systolic pressure. It is a measure of the pressure in your arteries as your heart beats. The second ("bottom") number is called the diastolic pressure. It is a measure of the pressure in your arteries as the heart relaxes. What does my blood pressure reading mean? Blood pressure is classified into four stages. Based on your blood pressure reading, your health care provider may use the following stages to determine what type of treatment you need, if any. Systolic pressure and diastolic pressure are measured in a unit called mm Hg. Normal  Systolic pressure: below 120.  Diastolic pressure: below 80. Elevated  Systolic pressure: 120-129.  Diastolic pressure: below 80. Hypertension stage 1  Systolic pressure: 130-139.  Diastolic pressure: 80-89. Hypertension stage 2  Systolic pressure: 140 or above.  Diastolic pressure: 90 or above. What health risks are associated with hypertension? Managing your hypertension is an important responsibility. Uncontrolled hypertension can lead to:  A heart attack.  A stroke.  A weakened blood vessel (aneurysm).  Heart failure.  Kidney damage.  Eye damage.  Metabolic syndrome.  Memory and concentration problems. What changes can I make to manage my  hypertension? Hypertension can be managed by making lifestyle changes and possibly by taking medicines. Your health care provider will help you make a plan to bring your blood pressure within a normal range. Eating and drinking   Eat a diet that is high in fiber and potassium, and low in salt (sodium), added sugar, and fat. An example eating plan is called the DASH (Dietary Approaches to Stop Hypertension) diet. To eat this way: ? Eat plenty of fresh fruits and vegetables. Try to fill half of your plate at each meal with fruits and vegetables. ? Eat whole grains, such as whole wheat pasta, brown rice, or whole grain bread. Fill about one quarter of your plate with whole grains. ? Eat low-fat diary products. ? Avoid fatty cuts of meat, processed or cured meats, and poultry with skin. Fill about one quarter of your plate with lean proteins such as fish, chicken without skin, beans, eggs, and tofu. ? Avoid premade and processed foods. These tend to be higher in sodium, added sugar, and fat.  Reduce your daily sodium intake. Most people with hypertension should eat less than 1,500 mg of sodium a day.  Limit alcohol intake to no more than 1 drink a day for nonpregnant women and 2 drinks a day for men. One drink equals 12 oz of beer, 5 oz of wine, or 1 oz of hard liquor. Lifestyle  Work with your health care provider to maintain a healthy body weight, or to lose weight. Ask what an ideal weight is for you.  Get at least 30 minutes of exercise that causes your heart to beat faster (aerobic exercise) most days of the week. Activities may include walking, swimming, or biking.  Include exercise   to strengthen your muscles (resistance exercise), such as weight lifting, as part of your weekly exercise routine. Try to do these types of exercises for 30 minutes at least 3 days a week.  Do not use any products that contain nicotine or tobacco, such as cigarettes and e-cigarettes. If you need help quitting,  ask your health care provider.  Control any long-term (chronic) conditions you have, such as high cholesterol or diabetes. Monitoring  Monitor your blood pressure at home as told by your health care provider. Your personal target blood pressure may vary depending on your medical conditions, your age, and other factors.  Have your blood pressure checked regularly, as often as told by your health care provider. Working with your health care provider  Review all the medicines you take with your health care provider because there may be side effects or interactions.  Talk with your health care provider about your diet, exercise habits, and other lifestyle factors that may be contributing to hypertension.  Visit your health care provider regularly. Your health care provider can help you create and adjust your plan for managing hypertension. Will I need medicine to control my blood pressure? Your health care provider may prescribe medicine if lifestyle changes are not enough to get your blood pressure under control, and if:  Your systolic blood pressure is 130 or higher.  Your diastolic blood pressure is 80 or higher. Take medicines only as told by your health care provider. Follow the directions carefully. Blood pressure medicines must be taken as prescribed. The medicine does not work as well when you skip doses. Skipping doses also puts you at risk for problems. Contact a health care provider if:  You think you are having a reaction to medicines you have taken.  You have repeated (recurrent) headaches.  You feel dizzy.  You have swelling in your ankles.  You have trouble with your vision. Get help right away if:  You develop a severe headache or confusion.  You have unusual weakness or numbness, or you feel faint.  You have severe pain in your chest or abdomen.  You vomit repeatedly.  You have trouble breathing. Summary  Hypertension is when the force of blood pumping  through your arteries is too strong. If this condition is not controlled, it may put you at risk for serious complications.  Your personal target blood pressure may vary depending on your medical conditions, your age, and other factors. For most people, a normal blood pressure is less than 120/80.  Hypertension is managed by lifestyle changes, medicines, or both. Lifestyle changes include weight loss, eating a healthy, low-sodium diet, exercising more, and limiting alcohol. This information is not intended to replace advice given to you by your health care provider. Make sure you discuss any questions you have with your health care provider. Document Revised: 09/17/2018 Document Reviewed: 04/23/2016 Elsevier Patient Education  2020 [employer]  Preventing High Cholesterol Cholesterol is a white, waxy substance similar to fat that the human body needs to help build cells. The liver makes all the cholesterol that a person's body needs. Having high cholesterol (hypercholesterolemia) increases a person's risk for heart disease and stroke. Extra (excess) cholesterol comes from the food the person eats. High cholesterol can often be prevented with diet and lifestyle changes. If you already have high cholesterol, you can control it with diet and lifestyle changes and with medicine. How can high cholesterol affect me? If you have high cholesterol, deposits (plaques) may build up on the   walls of your arteries. The arteries are the blood vessels that carry blood away from your heart. Plaques make the arteries narrower and stiffer. This can limit or block blood flow and cause blood clots to form. Blood clots:  Are tiny balls of cells that form in your blood.  Can move to the heart or brain, causing a heart attack or stroke. Plaques in arteries greatly increase your risk for heart attack and stroke.Making diet and lifestyle changes can reduce your risk for these conditions that may threaten your  life. What can increase my risk? This condition is more likely to develop in people who:  Eat foods that are high in saturated fat or cholesterol. Saturated fat is mostly found in: ? Foods that contain animal fat, such as red meat and some dairy products. ? Certain fatty foods made from plants, such as tropical oils.  Are overweight.  Are not getting enough exercise.  Have a family history of high cholesterol. What actions can I take to prevent this? Nutrition   Eat less saturated fat.  Avoid trans fats (partially hydrogenated oils). These are often found in margarine and in some baked goods, fried foods, and snacks bought in packages.  Avoid precooked or cured meat, such as sausages or meat loaves.  Avoid foods and drinks that have added sugars.  Eat more fruits, vegetables, and whole grains.  Choose healthy sources of protein, such as fish, poultry, lean cuts of red meat, beans, peas, lentils, and nuts.  Choose healthy sources of fat, such as: ? Nuts. ? Vegetable oils, especially olive oil. ? Fish that have healthy fats (omega-3 fatty acids), such as mackerel or salmon. The items listed above may not be a complete list of recommended foods and beverages. Contact a dietitian for more information. Lifestyle  Lose weight if you are overweight. Losing 5-10 lb (2.3-4.5 kg) can help prevent or control high cholesterol. It can also lower your risk for diabetes and high blood pressure. Ask your health care provider to help you with a diet and exercise plan to lose weight safely.  Do not use any products that contain nicotine or tobacco, such as cigarettes, e-cigarettes, and chewing tobacco. If you need help quitting, ask your health care provider.  Limit your alcohol intake. ? Do not drink alcohol if:  Your health care provider tells you not to drink.  You are pregnant, may be pregnant, or are planning to become pregnant. ? If you drink alcohol:  Limit how much you use  to:  0-1 drink a day for women.  0-2 drinks a day for men.  Be aware of how much alcohol is in your drink. In the [place] one drink equals one 12 oz bottle of beer (355 mL), one 5 oz glass of wine (148 mL), or one 1 oz glass of hard liquor (44 mL). Activity   Get enough exercise. Each week, do at least 150 minutes of exercise that takes a medium level of effort (moderate-intensity exercise). ? This is exercise that:  Makes your heart beat faster and makes you breathe harder than usual.  Allows you to still be able to talk. ? You could exercise in short sessions several times a day or longer sessions a few times a week. For example, on 5 days each week, you could walk fast or ride your bike 3 times a day for 10 minutes each time.  Do exercises as told by your health care provider. Medicines  In addition to diet and   lifestyle changes, your health care provider may recommend medicines to help lower cholesterol. This may be a medicine to lower the amount of cholesterol your liver makes. You may need medicine if: ? Diet and lifestyle changes do not lower your cholesterol enough. ? You have high cholesterol and other risk factors for heart disease or stroke.  Take over-the-counter and prescription medicines only as told by your health care provider. General information  Manage your risk factors for high cholesterol. Talk with your health care provider about all your risk factors and how to lower your risk.  Manage other conditions that you have, such as diabetes or high blood pressure (hypertension).  Have blood tests to check your cholesterol levels at regular points in time as told by your health care provider.  Keep all follow-up visits as told by your health care provider. This is important. Where to find more information  American Heart Association: www.heart.org  National Heart, Lung, and Blood Institute: www.nhlbi.nih.gov Summary  High cholesterol increases your risk for  heart disease and stroke. By keeping your cholesterol level low, you can reduce your risk for these conditions.  High cholesterol can often be prevented with diet and lifestyle changes.  Work with your health care provider to manage your risk factors, and have your blood tested regularly. This information is not intended to replace advice given to you by your health care provider. Make sure you discuss any questions you have with your health care provider. Document Revised: 09/17/2018 Document Reviewed: 02/02/2016 Elsevier Patient Education  2020 Elsevier Inc.  

## 2019-11-29 NOTE — Progress Notes (Signed)
Established Patient Office Visit  Subjective:  Patient ID: Jonathon Johnson, male    DOB: [birth year]  Age: 74 y.o. MRN: [id number]  CC:  Chief Complaint  Patient presents with  . Follow-up    follow up on BP and cholesterol, no concerns.     HPI Jonathon Johnson presents for follow-up of his hypertension and elevated cholesterol.  Blood pressures been running in the 140s over 90s.  On his current regimen of Tenormin 50 mg twice daily and losartan 25 twice daily.  He has been on these for years.  He has had a history of intermittent tachycardia that has responded well to the Tenormin.  Returns fasting for recheck of his cholesterol has been trying to decrease the fat and cholesterol in his diet.  Is actually lost a little bit of weight.  Past Medical History:  Diagnosis Date  . Asthma   . Chicken pox   . GERD (gastroesophageal reflux disease)   . Hyperlipidemia   . Hypertension     Past Surgical History:  Procedure Laterality Date  . CHOLECYSTECTOMY      Family History  Problem Relation Age of Onset  . Alcohol abuse Mother   . Depression Mother   . Diabetes Mother   . Alcohol abuse Father   . COPD Father   . Cancer Sister   . Hypertension Brother     Social History   Socioeconomic History  . Marital status: Married    Spouse name: Not on file  . Number of children: Not on file  . Years of education: Not on file  . Highest education level: Not on file  Occupational History  . Not on file  Tobacco Use  . Smoking status: Former Smoker    Quit date: 07/19/2010    Years since quitting: 9.3  . Smokeless tobacco: Never Used  Substance and Sexual Activity  . Alcohol use: Yes    Alcohol/week: 14.0 standard drinks    Types: 14 Glasses of wine per week    Comment: 3-4 glasses of wine daily  . Drug use: Never  . Sexual activity: Yes    Partners: Female  Other Topics Concern  . Not on file  Social History Narrative  . Not on file   Social Determinants of Health    Financial Resource Strain:   . Difficulty of Paying Living Expenses:   Food Insecurity:   . Worried About Programme researcher, broadcasting/film/video in the Last Year:   . Barista in the Last Year:   Transportation Needs:   . Freight forwarder (Medical):   [name] Kitchen Lack of Transportation (Non-Medical):   Physical Activity:   . Days of Exercise per Week:   . Minutes of Exercise per Session:   Stress:   . Feeling of Stress :   Social Connections:   . Frequency of Communication with Friends and Family:   . Frequency of Social Gatherings with Friends and Family:   . Attends Religious Services:   . Active Member of Clubs or Organizations:   . Attends Banker Meetings:   [name] Kitchen Marital Status:   Intimate Partner Violence:   . Fear of Current or Ex-Partner:   . Emotionally Abused:   [name] Kitchen Physically Abused:   . Sexually Abused:     Outpatient Medications Prior to Visit  Medication Sig Dispense Refill  . amitriptyline (ELAVIL) 25 MG tablet TAKE 1 TABLET AT BEDTIME 90 tablet 3  . diclofenac Sodium (VOLTAREN) 1 %  GEL Apply a small amount to painful joints on hand three times daily as needed. 100 g 2  . finasteride (PROSCAR) 5 MG tablet TAKE 1 TABLET DAILY 90 tablet 0  . fluticasone (FLONASE) 50 MCG/ACT nasal spray USE 2 SPRAYS IN EACH NOSTRIL DAILY 48 g 3  . Fluticasone-Salmeterol (ADVAIR DISKUS) 250-50 MCG/DOSE AEPB Inhale 1 puff into the lungs 2 (two) times daily. 3 each 3  . ibuprofen (ADVIL,MOTRIN) 200 MG tablet Take 200 mg by mouth as needed.    . Multiple Vitamin (MULTIVITAMIN) tablet Take 1 tablet by mouth daily.    [name] Kitchen omeprazole (PRILOSEC) 40 MG capsule Take 1 capsule (40 mg total) by mouth daily. 90 capsule 3  . tamsulosin (FLOMAX) 0.4 MG CAPS capsule TAKE 2 CAPSULES DAILY AFTER BREAKFAST 180 capsule 3  . atenolol (TENORMIN) 50 MG tablet TAKE 1 TABLET TWICE A DAY 180 tablet 3  . losartan (COZAAR) 25 MG tablet Take 1 tablet (25 mg total) by mouth 2 (two) times daily. 180 tablet 1   No  facility-administered medications prior to visit.    Allergies  Allergen Reactions  . Amoxicillin Diarrhea  . Morphine And Related Nausea And Vomiting    ROS Review of Systems  Constitutional: Negative.   Respiratory: Negative.  Negative for chest tightness and shortness of breath.   Cardiovascular: Negative.  Negative for chest pain and palpitations.  Gastrointestinal: Negative.   Musculoskeletal: Negative for gait problem and joint swelling.  Skin: Negative for pallor and rash.  Neurological: Negative.   Psychiatric/Behavioral: Negative.       Objective:    Physical Exam Constitutional:      General: He is not in acute distress.    Appearance: Normal appearance. He is not ill-appearing, toxic-appearing or diaphoretic.  HENT:     Head: Normocephalic and atraumatic.     Right Ear: External ear normal.     Left Ear: External ear normal.  Eyes:     General: No scleral icterus.       Right eye: No discharge.        Left eye: No discharge.     Conjunctiva/sclera: Conjunctivae normal.  Cardiovascular:     Rate and Rhythm: Normal rate and regular rhythm.  Pulmonary:     Effort: Pulmonary effort is normal.     Breath sounds: Normal breath sounds.  Abdominal:     General: Bowel sounds are normal.  Skin:    General: Skin is warm and dry.  Neurological:     Mental Status: He is alert and oriented to person, place, and time.  Psychiatric:        Mood and Affect: Mood normal.        Behavior: Behavior normal.    The 10-year ASCVD risk score Mikey Bussing DC Jr., et al., 2013) is: 29.9%   Values used to calculate the score:     Age: 40 years     Sex: Male     Is Non-Hispanic African American: No     Diabetic: No     Tobacco smoker: No     Systolic Blood Pressure: 443 mmHg     Is BP treated: Yes     HDL Cholesterol: 65.4 mg/dL     Total Cholesterol: 237 mg/dL BP (!) 146/82   Pulse 61   Temp (!) 97.5 F (36.4 C) (Tympanic)   Ht 5\' 7"  (1.702 m)   Wt 191 lb 9.6 oz (86.9  kg)   SpO2 96%   BMI 30.01 kg/m  Wt Readings from Last 3 Encounters:  11/29/19 191 lb 9.6 oz (86.9 kg)  06/28/19 194 lb 12.8 oz (88.4 kg)  07/19/18 182 lb 6 oz (82.7 kg)     There are no preventive care reminders to display for this patient.  There are no preventive care reminders to display for this patient.  Lab Results  Component Value Date   TSH 2.10 11/04/2016   Lab Results  Component Value Date   WBC 5.7 06/28/2019   HGB 14.5 06/28/2019   HCT 42.8 06/28/2019   MCV 95.1 06/28/2019   PLT 265.0 06/28/2019   Lab Results  Component Value Date   NA 137 06/28/2019   K 4.0 06/28/2019   CO2 28 06/28/2019   GLUCOSE 92 06/28/2019   BUN 17 06/28/2019   CREATININE 0.96 06/28/2019   BILITOT 0.5 06/28/2019   ALKPHOS 60 06/28/2019   AST 29 06/28/2019   ALT 32 06/28/2019   PROT 6.9 06/28/2019   ALBUMIN 4.0 06/28/2019   CALCIUM 9.0 06/28/2019   GFR 76.73 06/28/2019   Lab Results  Component Value Date   CHOL 237 (H) 06/28/2019   Lab Results  Component Value Date   HDL 65.40 06/28/2019   Lab Results  Component Value Date   LDLCALC 143 (H) 06/28/2019   Lab Results  Component Value Date   TRIG 144.0 06/28/2019   Lab Results  Component Value Date   CHOLHDL 4 06/28/2019   No results found for: HGBA1C    Assessment & Plan:   Problem List Items Addressed This Visit      Cardiovascular and Mediastinum   Essential hypertension - Primary   Relevant Medications   metoprolol tartrate (LOPRESSOR) 50 MG tablet   losartan (COZAAR) 100 MG tablet   Other Relevant Orders   Basic metabolic panel     Other   Elevated LDL cholesterol level   Relevant Orders   Lipid panel   LDL cholesterol, direct      Meds ordered this encounter  Medications  . metoprolol tartrate (LOPRESSOR) 50 MG tablet    Sig: Take 1 tablet (50 mg total) by mouth 2 (two) times daily.    Dispense:  180 tablet    Refill:  3  . losartan (COZAAR) 100 MG tablet    Sig: Take 1 tablet (100 mg  total) by mouth daily.    Dispense:  90 tablet    Refill:  3    Follow-up: Return in about 3 months (around 02/29/2020), or Risk of having a heart attack or a stroke over the next 10 years is 29%!.   Patient is comfortable taking metoprolol tartrate twice daily.  And increased losartan to 100 mg daily.  Believe that this will better control his hypertension. Mliss Sax, MD

## 2019-12-07 ENCOUNTER — Emergency Department (HOSPITAL_COMMUNITY): Payer: Medicare Other

## 2019-12-07 ENCOUNTER — Encounter (HOSPITAL_COMMUNITY): Payer: Self-pay | Admitting: Emergency Medicine

## 2019-12-07 ENCOUNTER — Other Ambulatory Visit: Payer: Self-pay

## 2019-12-07 ENCOUNTER — Emergency Department (HOSPITAL_COMMUNITY)
Admission: EM | Admit: 2019-12-07 | Discharge: 2019-12-08 | Disposition: A | Discharge status: Home / Self Care | Payer: Medicare Other | Attending: Emergency Medicine | Admitting: Emergency Medicine

## 2019-12-07 DIAGNOSIS — Z87891 Personal history of nicotine dependence: Secondary | ICD-10-CM | POA: Diagnosis not present

## 2019-12-07 DIAGNOSIS — R110 Nausea: Secondary | ICD-10-CM | POA: Diagnosis not present

## 2019-12-07 DIAGNOSIS — R002 Palpitations: Secondary | ICD-10-CM

## 2019-12-07 DIAGNOSIS — I10 Essential (primary) hypertension: Secondary | ICD-10-CM | POA: Insufficient documentation

## 2019-12-07 DIAGNOSIS — Z79899 Other long term (current) drug therapy: Secondary | ICD-10-CM | POA: Diagnosis not present

## 2019-12-07 DIAGNOSIS — R000 Tachycardia, unspecified: Secondary | ICD-10-CM | POA: Insufficient documentation

## 2019-12-07 LAB — BASIC METABOLIC PANEL
Anion gap: 13 (ref 5–15)
BUN: 12 mg/dL (ref 8–23)
CO2: 21 mmol/L — ABNORMAL LOW (ref 22–32)
Calcium: 9.2 mg/dL (ref 8.9–10.3)
Chloride: 104 mmol/L (ref 98–111)
Creatinine, Ser: 0.85 mg/dL (ref 0.61–1.24)
GFR calc Af Amer: >60 mL/min (ref >60)
GFR calc non Af Amer: >60 mL/min (ref >60)
Glucose, Bld: 124 mg/dL — ABNORMAL HIGH (ref 70–99)
Potassium: 3.6 mmol/L (ref 3.5–5.1)
Sodium: 138 mmol/L (ref 135–145)

## 2019-12-07 LAB — CBC
HCT: 43.2 % (ref 39.0–52.0)
Hemoglobin: 14.4 g/dL (ref 13.0–17.0)
MCH: 31.4 pg (ref 26.0–34.0)
MCHC: 33.3 g/dL (ref 30.0–36.0)
MCV: 94.3 fL (ref 80.0–100.0)
Platelets: 256 10*3/uL (ref 150–400)
RBC: 4.58 MIL/uL (ref 4.22–5.81)
RDW: 12.1 % (ref 11.5–15.5)
WBC: 6.6 10*3/uL (ref 4.0–10.5)
nRBC: 0 % (ref 0.0–0.2)

## 2019-12-07 LAB — TROPONIN I (HIGH SENSITIVITY): Troponin I (High Sensitivity): 4 ng/L (ref <18)

## 2019-12-07 MED ORDER — SODIUM CHLORIDE 0.9% FLUSH
3.0000 mL | Freq: Once | INTRAVENOUS | Status: AC
  Administered 2019-12-08: 3 mL via INTRAVENOUS

## 2019-12-07 NOTE — ED Triage Notes (Signed)
Pt reports tachycardia with dizziness and fatigue. Hx tachycardia, reports it doesn't normally last this long.

## 2019-12-08 DIAGNOSIS — R000 Tachycardia, unspecified: Secondary | ICD-10-CM | POA: Diagnosis not present

## 2019-12-08 LAB — D-DIMER, QUANTITATIVE: D-Dimer, Quant: 0.62 ug/mL-FEU — ABNORMAL HIGH (ref 0.00–0.50)

## 2019-12-08 LAB — TROPONIN I (HIGH SENSITIVITY): Troponin I (High Sensitivity): 6 ng/L (ref <18)

## 2019-12-08 LAB — TSH: TSH: 1.462 u[IU]/mL (ref 0.350–4.500)

## 2019-12-08 MED ORDER — LORAZEPAM 1 MG PO TABS
1.0000 mg | ORAL_TABLET | Freq: Once | ORAL | Status: AC
  Administered 2019-12-08: 1 mg via ORAL
  Filled 2019-12-08: qty 1

## 2019-12-08 MED ORDER — SODIUM CHLORIDE 0.9 % IV BOLUS
1000.0000 mL | Freq: Once | INTRAVENOUS | Status: AC
  Administered 2019-12-08: 1000 mL via INTRAVENOUS

## 2019-12-08 MED ORDER — METOPROLOL TARTRATE 5 MG/5ML IV SOLN
5.0000 mg | Freq: Once | INTRAVENOUS | Status: AC
  Administered 2019-12-08: 5 mg via INTRAVENOUS
  Filled 2019-12-08: qty 5

## 2019-12-08 NOTE — ED Notes (Signed)
Discharge instructions reviewed with pt. Pt verbalized understanding.  RN advised pt not to drive due to ativan, pt and family verbalized understanding.

## 2019-12-08 NOTE — Discharge Instructions (Signed)
Keep yourself hydrated.  Call your doctor in the morning to discuss the change in your medication may be affecting your heart rates.  You should also follow-up with a cardiologist to get a monitor to wear at home.  Return to the ED with chest pain, shortness of breath, dizziness, lightheadedness, or any other concerns.

## 2019-12-08 NOTE — ED Provider Notes (Signed)
[facility] EMERGENCY DEPARTMENT Provider Note   CSN: [id number] Arrival date & time: 12/07/19  1956     History Chief Complaint  Patient presents with  . Tachycardia    Jonathon Johnson is a 74 y.o. male.  Patient with history of hypertension, hyperlipidemia, GERD here with palpitations and racing heart since approximately 6:30 PM.  States came on suddenly while he was watching television.  He "felt my pulse in my face" and felt palpitations as well as some nausea and anxious feeling.  He checked his pulse and was in the 120s.  This maintained for several hours before he presented to the hospital around 8 PM.  Patient has waited approximately 8 hours in the waiting room and is still tachycardic in the 120s.  He denies any change in his symptoms.  He denies any chest pain or shortness of breath.  No abdominal pain, vomiting or fever.  No recent illness.  Reports feeling full in his stomach but has no pain.  Patient has had intermittent episodes of palpitations and tachycardia in the past and has been maintained on atenolol for many years.  He recently had this switched by his PCP 3 days ago to metoprolol twice daily for apparent better blood pressure control.  His losartan dose was also increased.  Patient has had these episodes before while in the atenolol but never lasting this long or this severe.  He denies any illicit drug use.  He states he drinks approximately 3 to 4 glasses of wine per night.  He did have only 2 glasses last night but denies any history of withdrawal symptoms. No other medication changes.  Reports having a stress test and Holter monitor remotely that were reassuring.  The history is provided by the patient and the spouse.       Past Medical History:  Diagnosis Date  . Asthma   . Chicken pox   . GERD (gastroesophageal reflux disease)   . Hyperlipidemia   . Hypertension     Patient Active Problem List   Diagnosis Date Noted  . Thumb pain, right  06/28/2019  . Arthralgia of right temporomandibular joint 12/20/2018  . Diarrhea 12/20/2018  . Healthcare maintenance 07/19/2018  . Insomnia 07/19/2018  . Gastroesophageal reflux disease 07/19/2018  . Benign prostatic hyperplasia with urinary hesitancy 07/19/2018  . Essential hypertension 07/19/2018  . Elevated LDL cholesterol level 07/19/2018  . Cough 07/19/2018  . Mild reactive airways disease 07/19/2018    Past Surgical History:  Procedure Laterality Date  . CHOLECYSTECTOMY         Family History  Problem Relation Age of Onset  . Alcohol abuse Mother   . Depression Mother   . Diabetes Mother   . Alcohol abuse Father   . COPD Father   . Cancer Sister   . Hypertension Brother     Social History   Tobacco Use  . Smoking status: Former Smoker    Quit date: 07/19/2010    Years since quitting: 9.3  . Smokeless tobacco: Never Used  Substance Use Topics  . Alcohol use: Yes    Alcohol/week: 14.0 standard drinks    Types: 14 Glasses of wine per week    Comment: 3-4 glasses of wine daily  . Drug use: Never    Home Medications Prior to Admission medications   Medication Sig Start Date End Date Taking? Authorizing Provider  amitriptyline (ELAVIL) 25 MG tablet TAKE 1 TABLET AT BEDTIME 09/23/19   Mliss Sax, MD  diclofenac Sodium (VOLTAREN) 1 % GEL Apply a small amount to painful joints on hand three times daily as needed. 06/28/19   Mliss SaxKremer, William Alfred, MD  finasteride (PROSCAR) 5 MG tablet TAKE 1 TABLET DAILY 11/14/19   Mliss SaxKremer, William Alfred, MD  fluticasone [facility](FLONASE) 50 MCG/ACT nasal spray USE 2 SPRAYS IN [facility] LPEACH NOSTRIL DAILY 06/06/19   Mliss SaxKremer, William Alfred, MD  Fluticasone-Salmeterol (ADVAIR DISKUS) 250-50 MCG/DOSE AEPB Inhale 1 puff into the lungs 2 (two) times daily. 01/18/19   Mliss SaxKremer, William Alfred, MD  ibuprofen (ADVIL,MOTRIN) 200 MG tablet Take 200 mg by mouth as needed.    [provider]  losartan (COZAAR) 100 MG tablet Take 1 tablet (100 mg  total) by mouth daily. 11/29/19   Mliss SaxKremer, William Alfred, MD  metoprolol tartrate (LOPRESSOR) 50 MG tablet Take 1 tablet (50 mg total) by mouth 2 (two) times daily. 11/29/19   Mliss SaxKremer, William Alfred, MD  Multiple Vitamin (MULTIVITAMIN) tablet Take 1 tablet by mouth daily.    [provider]  omeprazole (PRILOSEC) 40 MG capsule Take 1 capsule (40 mg total) by mouth daily. 03/23/19   Mliss SaxKremer, William Alfred, MD  tamsulosin [facility](FLOMAX) 0.4 MG CAPS capsule TAKE 2 CAPSULES DAILY AFTER BREAKFAST 06/28/19   Mliss SaxKremer, William Alfred, MD    Allergies    Amoxicillin and Morphine and related  Review of Systems   Review of Systems  Constitutional: Negative for activity change, appetite change and fever.  HENT: Negative for congestion and rhinorrhea.   Respiratory: Negative for cough, chest tightness and shortness of breath.   Cardiovascular: Positive for palpitations.  Gastrointestinal: Positive for nausea. Negative for abdominal pain and vomiting.  Genitourinary: Negative for dysuria and hematuria.  Musculoskeletal: Negative for arthralgias and myalgias.  Skin: Negative for rash.  Neurological: Negative for dizziness, weakness and headaches.  Psychiatric/Behavioral: The patient is nervous/anxious.    all other systems are negative except as noted in the HPI and PMH.    Physical Exam Updated Vital Signs BP (!) 178/117 (BP Location: Right Arm)   Pulse (!) 127   Temp 97.7 F (36.5 C) (Oral)   Resp 18   SpO2 98%   Physical Exam Vitals and nursing note reviewed.  Constitutional:      General: He is not in acute distress.    Appearance: He is well-developed.     Comments: Anxious appearing  HENT:     Head: Normocephalic and atraumatic.     Mouth/Throat:     Pharynx: No oropharyngeal exudate.  Eyes:     Conjunctiva/sclera: Conjunctivae normal.     Pupils: Pupils are equal, round, and reactive to light.  Neck:     Comments: No meningismus. Cardiovascular:     Rate and Rhythm: Regular  rhythm. Tachycardia present.     Heart sounds: Normal heart sounds. No murmur heard.      Comments: Regular tachycardia in the 120s Pulmonary:     Effort: Pulmonary effort is normal. No respiratory distress.     Breath sounds: Normal breath sounds.  Abdominal:     Palpations: Abdomen is soft.     Tenderness: There is no abdominal tenderness. There is no guarding or rebound.  Musculoskeletal:        General: No tenderness. Normal range of motion.     Cervical back: Normal range of motion and neck supple.  Skin:    General: Skin is warm.  Neurological:     Mental Status: He is alert and oriented to person, place, and time.  Cranial Nerves: No cranial nerve deficit.     Motor: No abnormal muscle tone.     Coordination: Coordination normal.     Comments: No ataxia on finger to nose bilaterally. No pronator drift. 5/5 strength throughout. CN 2-12 intact.Equal grip strength. Sensation intact.   Mildly tremulous  Psychiatric:        Behavior: Behavior normal.     ED Results / Procedures / Treatments   Labs (all labs ordered are listed, but only abnormal results are displayed) Labs Reviewed  BASIC METABOLIC PANEL - Abnormal; Notable for the following components:      Result Value   CO2 21 (*)    Glucose, Bld 124 (*)    All other components within normal limits  D-DIMER, QUANTITATIVE (NOT AT [facility]) - Abnormal; Notable for the following components:   D-Dimer, Quant 0.62 (*)    All other components within normal limits  CBC  TSH  TROPONIN I (HIGH SENSITIVITY)  TROPONIN I (HIGH SENSITIVITY)    EKG EKG Interpretation  Date/Time:  Wednesday December 07 2019 20:19:52 EDT Ventricular Rate:  124 PR Interval:  174 QRS Duration: 76 QT Interval:  298 QTC Calculation: 428 R Axis:   -27 Text Interpretation: Sinus tachycardia Otherwise normal ECG No previous ECGs available Confirmed by Glynn Octave [id number]) on 12/08/2019 3:28:19 AM   Radiology DG Chest 2 View  Result Date:  12/07/2019 CLINICAL DATA:  Tachycardia. EXAM: CHEST - 2 VIEW COMPARISON:  July 21, 2018. FINDINGS: The heart size and mediastinal contours are within normal limits. Both lungs are clear. No pneumothorax or pleural effusion is noted. The visualized skeletal structures are unremarkable. IMPRESSION: No active cardiopulmonary disease. Electronically Signed   By: Lupita Raider M.D.   On: 12/07/2019 20:54    Procedures Procedures (including critical care time)  Medications Ordered in ED Medications  sodium chloride flush (NS) 0.9 % injection 3 mL (has no administration in time range)  sodium chloride 0.9 % bolus 1,000 mL (has no administration in time range)  LORazepam (ATIVAN) tablet 1 mg (has no administration in time range)    ED Course  I have reviewed the triage vital signs and the nursing notes.  Pertinent labs & imaging results that were available during my care of the patient were reviewed by me and considered in my medical decision making (see chart for details).    MDM Rules/Calculators/A&P                         Patient here with palpitations, anxious feeling, nausea ongoing for approximately 8 hours.  EKG is sinus tachycardia and very regular.  Lab work obtained in triage is reassuring with normal electrolytes and negative troponin x2.  Suspect patient's recent change in beta-blocker may be contributing to his presentation and recurrent episodes of tachycardia.  Also admits to feeling anxious.  Mild alcohol withdrawal may also be playing a role as he did not have his usual wine amount last night.  Low suspicion for ACS, PE.  Will check screening TSH as well as orthostatic vitals, hydrate, p.o. Ativan  Patient given IV fluids as well as p.o. Ativan and IV metoprolol.  Heart rate has improved to 80s and 90s.  Age-adjusted D-dimer is negative. TSH is normal.   Heart rate is improved to 80s and 90s patient feels improved.  Troponin negative x2.  Discussed with patient his  tachycardia is likely multifactorial possibly due to to recent change in beta-blocker with  also anxiety and possibly mild alcohol withdrawal contributing.  Advised to follow-up with his PCP regarding beta-blocker change.  Will refer to cardiology for Holter monitor and further evaluation.  No evidence of life-threatening alcohol withdrawal at this time.  Return to the ED with chest pain, shortness of breath, or other concerns. Final Clinical Impression(s) / ED Diagnoses Final diagnoses:  Tachycardia  Palpitations    Rx / DC Orders ED Discharge Orders    None       Shawnya Mayor, Jeannett Senior, MD 12/08/19 [phone]

## 2019-12-09 ENCOUNTER — Encounter: Payer: Self-pay | Admitting: Family Medicine

## 2019-12-09 NOTE — Telephone Encounter (Signed)
Hi Jonathon Johnson,  Thanks for the update. I had seen the ER note. Sorry that you had to go to the ER. Fu with me please, also.

## 2019-12-14 ENCOUNTER — Other Ambulatory Visit: Payer: Self-pay

## 2019-12-15 ENCOUNTER — Ambulatory Visit (INDEPENDENT_AMBULATORY_CARE_PROVIDER_SITE_OTHER): Payer: Medicare Other | Admitting: Cardiology

## 2019-12-15 ENCOUNTER — Ambulatory Visit (INDEPENDENT_AMBULATORY_CARE_PROVIDER_SITE_OTHER): Payer: Medicare Other | Admitting: Family Medicine

## 2019-12-15 ENCOUNTER — Encounter: Payer: Self-pay | Admitting: *Deleted

## 2019-12-15 ENCOUNTER — Encounter: Payer: Self-pay | Admitting: Cardiology

## 2019-12-15 ENCOUNTER — Encounter: Payer: Self-pay | Admitting: Family Medicine

## 2019-12-15 VITALS — BP 125/76 | HR 74 | Ht 67.0 in | Wt 190.8 lb

## 2019-12-15 VITALS — BP 124/78 | HR 61 | Temp 97.7°F | Ht 67.0 in | Wt 190.4 lb

## 2019-12-15 DIAGNOSIS — R002 Palpitations: Secondary | ICD-10-CM

## 2019-12-15 DIAGNOSIS — R000 Tachycardia, unspecified: Secondary | ICD-10-CM

## 2019-12-15 DIAGNOSIS — I10 Essential (primary) hypertension: Secondary | ICD-10-CM | POA: Diagnosis not present

## 2019-12-15 DIAGNOSIS — R42 Dizziness and giddiness: Secondary | ICD-10-CM

## 2019-12-15 DIAGNOSIS — R0602 Shortness of breath: Secondary | ICD-10-CM | POA: Diagnosis not present

## 2019-12-15 NOTE — Patient Instructions (Signed)
Medication Instructions:  Your physician recommends that you continue on your current medications as directed. Please refer to the Current Medication list given to you today.  Testing/Procedures: Your physician has requested that you have an echocardiogram. Echocardiography is a painless test that uses sound waves to create images of your heart. It provides your doctor with information about the size and shape of your heart and how well your heart's chambers and valves are working. This procedure takes approximately one hour. There are no restrictions for this procedure.   ZIO XT- Long Term Monitor Instructions   Your physician has requested you wear your ZIO patch monitor 7 days. (STOP metoprolol while wearing)   This is a single patch monitor.  [employer] supplies one patch monitor per enrollment.  Additional stickers are not available.   Please do not apply patch if you will be having a Nuclear Stress Test, Echocardiogram, Cardiac CT, MRI, or Chest Xray during the time frame you would be wearing the monitor. The patch cannot be worn during these tests.  You cannot remove and re-apply the ZIO XT patch monitor.   Your ZIO patch monitor will be sent USPS Priority mail from [facility] directly to your home address. The monitor may also be mailed to a PO BOX if home delivery is not available.   It may take 3-5 days to receive your monitor after you have been enrolled.   Once you have received you monitor, please review enclosed instructions.  Your monitor has already been registered assigning a specific monitor serial # to you.   Applying the monitor   Shave hair from upper left chest.   Hold abrader disc by orange tab.  Rub abrader in 40 strokes over left upper chest as indicated in your monitor instructions.   Clean area with 4 enclosed alcohol pads .  Use all pads to assure are is cleaned thoroughly.  Let dry.   Apply patch as indicated in monitor instructions.  Patch will be place  under collarbone on left side of chest with arrow pointing upward.   Rub patch adhesive wings for 2 minutes.Remove white label marked "1".  Remove white label marked "2".  Rub patch adhesive wings for 2 additional minutes.   While looking in a mirror, press and release button in center of patch.  A small green light will flash 3-4 times .  This will be your only indicator the monitor has been turned on.     Do not shower for the first 24 hours.  You may shower after the first 24 hours.   Press button if you feel a symptom. You will hear a small click.  Record Date, Time and Symptom in the Patient Log Book.   When you are ready to remove patch, follow instructions on last 2 pages of Patient Log Book.  Stick patch monitor onto last page of Patient Log Book.   Place Patient Log Book in [place] box.  Use locking tab on box and tape box closed securely.  The [employer] has [employer] on it.  Please place in mailbox as soon as possible.  Your physician should have your test results approximately 7 days after the monitor has been mailed back to [facility].   Call [facility] Customer Care at [phone] if you have questions regarding your ZIO XT patch monitor.  Call them immediately if you see an orange light blinking on your monitor.   If your monitor falls off in less than  4 days contact our Monitor department at [phone].  If your monitor becomes loose or falls off after 4 days call [employer] at [phone] for suggestions on securing your monitor.    Follow-Up: At [facility], you and your health needs are our priority.  As part of our continuing mission to provide you with exceptional heart care, we have created designated Provider Care Teams.  These Care Teams include your primary Cardiologist (physician) and Advanced Practice Providers (APPs -  Physician Assistants and Nurse Practitioners) who all work together to provide you with the care you need, when you need  it.  We recommend signing up for the patient portal called "MyChart".  Sign up information is provided on this After Visit Summary.  MyChart is used to connect with patients for Virtual Visits (Telemedicine).  Patients are able to view lab/test results, encounter notes, upcoming appointments, etc.  Non-urgent messages can be sent to your provider as well.   To learn more about what you can do with MyChart, go to [email or web address].    Your next appointment:   3 month(s)  The format for your next appointment:   In Person  Provider:   Epifanio Lesches, MD

## 2019-12-15 NOTE — Progress Notes (Signed)
Cardiology Office Note:    Date:  12/15/2019   ID:  Jonathon Johnson, DOB 1945-10-23, MRN [id number]  PCP:  Mliss Sax, MD  Cardiologist:  No primary care provider on file.  Electrophysiologist:  None   Referring MD: Mliss Sax,*   Chief Complaint  Patient presents with  . Tachycardia    History of Present Illness:    Jonathon Johnson is a 74 y.o. male with a hx of asthma, hypertension, hyperlipidemia, GERD who is referred by Dr. Doreene Burke for evaluation of tachycardia.  He was seen in the ED on 12/07/2019 for tachycardia.  He reported having palpitations and feeling like his heart was racing.  Checked his pulse and it was in the 130s.  In the ED, pulse remained in 120s, sinus tachycardia.  Lab work showed normal electrolytes,TSH, and negative troponin x2.  Age-adjusted D-dimer was negative.  He was given IV fluids, p.o. Ativan and IV metoprolol.  Heart rate improved to 80s to 90s.  He reports that he has palpitations most nights where he feels like his heart is racing.  Typically last for at least 30 minutes.  The episode that brought him to the ER was increased severity and duration of symptoms.  He has had lightheadedness during these episodes but denies any syncope.  States that he has been taking beta-blocker for around 30 years.  He exercises by going for walks every morning for about 25 minutes.  He denies any chest pain or shortness of breath outside of tachycardia episodes.  No lower extremity edema.  States that he smoked 1 pack/day x 50 years, but quit in 2012.  Drinks 3.5 to 5 glasses of wine per night.  No family history of heart disease.   Past Medical History:  Diagnosis Date  . Asthma   . Chicken pox   . GERD (gastroesophageal reflux disease)   . Hyperlipidemia   . Hypertension     Past Surgical History:  Procedure Laterality Date  . CHOLECYSTECTOMY      Current Medications: Current Meds  Medication Sig  . amitriptyline (ELAVIL) 25 MG tablet TAKE 1  TABLET AT BEDTIME  . diclofenac Sodium (VOLTAREN) 1 % GEL Apply a small amount to painful joints on hand three times daily as needed.  . finasteride (PROSCAR) 5 MG tablet TAKE 1 TABLET DAILY  . fluticasone (FLONASE) 50 MCG/ACT nasal spray USE 2 SPRAYS IN EACH NOSTRIL DAILY  . Fluticasone-Salmeterol (ADVAIR DISKUS) 250-50 MCG/DOSE AEPB Inhale 1 puff into the lungs 2 (two) times daily.  [name] Kitchen ibuprofen (ADVIL,MOTRIN) 200 MG tablet Take 200 mg by mouth as needed.  [name] Kitchen losartan (COZAAR) 100 MG tablet Take 1 tablet (100 mg total) by mouth daily.  . metoprolol tartrate (LOPRESSOR) 50 MG tablet Take 1 tablet (50 mg total) by mouth 2 (two) times daily.  . Multiple Vitamin (MULTIVITAMIN) tablet Take 1 tablet by mouth daily.  [name] Kitchen omeprazole (PRILOSEC) 40 MG capsule Take 1 capsule (40 mg total) by mouth daily.  . tamsulosin (FLOMAX) 0.4 MG CAPS capsule TAKE 2 CAPSULES DAILY AFTER BREAKFAST     Allergies:   Amoxicillin and Morphine and related   Social History   Socioeconomic History  . Marital status: Married    Spouse name: Not on file  . Number of children: Not on file  . Years of education: Not on file  . Highest education level: Not on file  Occupational History  . Not on file  Tobacco Use  . Smoking status: Former Smoker  Quit date: 07/19/2010    Years since quitting: 9.4  . Smokeless tobacco: Never Used  Substance and Sexual Activity  . Alcohol use: Yes    Alcohol/week: 14.0 standard drinks    Types: 14 Glasses of wine per week    Comment: 3-4 glasses of wine daily  . Drug use: Never  . Sexual activity: Yes    Partners: Female  Other Topics Concern  . Not on file  Social History Narrative  . Not on file   Social Determinants of Health   Financial Resource Strain:   . Difficulty of Paying Living Expenses:   Food Insecurity:   . Worried About Programme researcher, broadcasting/film/video in the Last Year:   . Barista in the Last Year:   Transportation Needs:   . Freight forwarder (Medical):     [name] Kitchen Lack of Transportation (Non-Medical):   Physical Activity:   . Days of Exercise per Week:   . Minutes of Exercise per Session:   Stress:   . Feeling of Stress :   Social Connections:   . Frequency of Communication with Friends and Family:   . Frequency of Social Gatherings with Friends and Family:   . Attends Religious Services:   . Active Member of Clubs or Organizations:   . Attends Banker Meetings:   [name] Kitchen Marital Status:      Family History: The patient's family history includes Alcohol abuse in his father and mother; COPD in his father; Cancer in his sister; Depression in his mother; Diabetes in his mother; Hypertension in his brother.  ROS:   Please see the history of present illness.     All other systems reviewed and are negative.  EKGs/Labs/Other Studies Reviewed:    The following studies were reviewed today:   EKG:  EKG is ordered today.  The ekg ordered today demonstrates normal sinus rhythm, rate 70, no ST/T abnormalities  Recent Labs: 06/28/2019: ALT 32 12/07/2019: BUN 12; Creatinine, Ser 0.85; Hemoglobin 14.4; Platelets 256; Potassium 3.6; Sodium 138 12/08/2019: TSH 1.462  Recent Lipid Panel    Component Value Date/Time   CHOL 188 11/29/2019 1126   TRIG 71.0 11/29/2019 1126   HDL 65.40 11/29/2019 1126   CHOLHDL 3 11/29/2019 1126   VLDL 14.2 11/29/2019 1126   LDLCALC 108 (H) 11/29/2019 1126   LDLDIRECT 108.0 11/29/2019 1126    Physical Exam:    VS:  BP 125/76   Pulse 74   Ht 5\' 7"  (1.702 m)   Wt 190 lb 12.8 oz (86.5 kg)   SpO2 99%   BMI 29.88 kg/m     Wt Readings from Last 3 Encounters:  12/15/19 190 lb 6.4 oz (86.4 kg)  12/15/19 190 lb 12.8 oz (86.5 kg)  11/29/19 191 lb 9.6 oz (86.9 kg)     GEN:  in no acute distress HEENT: Normal NECK: No JVD; No carotid bruits CARDIAC: RRR, no murmurs, rubs, gallops RESPIRATORY:  Clear to auscultation without rales, wheezing or rhonchi  ABDOMEN: Soft, non-tender,  non-distended MUSCULOSKELETAL:  No edema; No deformity  SKIN: Warm and dry NEUROLOGIC:  Alert and oriented x 3 PSYCHIATRIC:  Normal affect   ASSESSMENT:    1. Palpitations   2. Tachycardia   3. SOB (shortness of breath)   4. Essential hypertension    PLAN:    Tachycardia/palpitations/lightheadedness: Description concerning for arrhythmia, will check Zio patch x7 days for further evaluation.  Recommended holding metoprolol while wearing monitor.  Suspect alcohol use (  3.5 to 5 glasses of wine per night) could be contributing, recommended cutting back on alcohol intake.  He denies any history of withdrawal symptoms.  Will check echocardiogram to evaluate for structural heart disease.  Hypertension: On losartan 100 mg daily and metoprolol 50 mg twice daily.  Appears controlled.  RTC in 3 months  Medication Adjustments/Labs and Tests Ordered: Current medicines are reviewed at length with the patient today.  Concerns regarding medicines are outlined above.  Orders Placed This Encounter  Procedures  . LONG TERM MONITOR (3-14 DAYS)  . EKG 12-Lead  . ECHOCARDIOGRAM COMPLETE   No orders of the defined types were placed in this encounter.   Patient Instructions  Medication Instructions:  Your physician recommends that you continue on your current medications as directed. Please refer to the Current Medication list given to you today.  Testing/Procedures: Your physician has requested that you have an echocardiogram. Echocardiography is a painless test that uses sound waves to create images of your heart. It provides your doctor with information about the size and shape of your heart and how well your heart's chambers and valves are working. This procedure takes approximately one hour. There are no restrictions for this procedure.   ZIO XT- Long Term Monitor Instructions   Your physician has requested you wear your ZIO patch monitor 7 days. (STOP metoprolol while wearing)   This is a  single patch monitor.  [employer] supplies one patch monitor per enrollment.  Additional stickers are not available.   Please do not apply patch if you will be having a Nuclear Stress Test, Echocardiogram, Cardiac CT, MRI, or Chest Xray during the time frame you would be wearing the monitor. The patch cannot be worn during these tests.  You cannot remove and re-apply the ZIO XT patch monitor.   Your ZIO patch monitor will be sent USPS Priority mail from [facility] [employer] directly to your home address. The monitor may also be mailed to a PO BOX if home delivery is not available.   It may take 3-5 days to receive your monitor after you have been enrolled.   Once you have received you monitor, please review enclosed instructions.  Your monitor has already been registered assigning a specific monitor serial # to you.   Applying the monitor   Shave hair from upper left chest.   Hold abrader disc by orange tab.  Rub abrader in 40 strokes over left upper chest as indicated in your monitor instructions.   Clean area with 4 enclosed alcohol pads .  Use all pads to assure are is cleaned thoroughly.  Let dry.   Apply patch as indicated in monitor instructions.  Patch will be place under collarbone on left side of chest with arrow pointing upward.   Rub patch adhesive wings for 2 minutes.Remove white label marked "1".  Remove white label marked "2".  Rub patch adhesive wings for 2 additional minutes.   While looking in a mirror, press and release button in center of patch.  A small green light will flash 3-4 times .  This will be your only indicator the monitor has been turned on.     Do not shower for the first 24 hours.  You may shower after the first 24 hours.   Press button if you feel a symptom. You will hear a small click.  Record Date, Time and Symptom in the Patient Log Book.   When you are ready to remove patch, follow instructions on last 2  pages of Patient Log Book.  Stick patch monitor onto  last page of Patient Log Book.   Place Patient Log Book in [place] box.  Use locking tab on box and tape box closed securely.  The [employer] has [employer] on it.  Please place in mailbox as soon as possible.  Your physician should have your test results approximately 7 days after the monitor has been mailed back to [facility].   Call [facility] Customer Care at [phone] if you have questions regarding your ZIO XT patch monitor.  Call them immediately if you see an orange light blinking on your monitor.   If your monitor falls off in less than 4 days contact our Monitor department at [phone].  If your monitor becomes loose or falls off after 4 days call [employer] at [phone] for suggestions on securing your monitor.    Follow-Up: At [facility], you and your health needs are our priority.  As part of our continuing mission to provide you with exceptional heart care, we have created designated Provider Care Teams.  These Care Teams include your primary Cardiologist (physician) and Advanced Practice Providers (APPs -  Physician Assistants and Nurse Practitioners) who all work together to provide you with the care you need, when you need it.  We recommend signing up for the patient portal called "MyChart".  Sign up information is provided on this After Visit Summary.  MyChart is used to connect with patients for Virtual Visits (Telemedicine).  Patients are able to view lab/test results, encounter notes, upcoming appointments, etc.  Non-urgent messages can be sent to your provider as well.   To learn more about what you can do with MyChart, go to [email or web address].    Your next appointment:   3 month(s)  The format for your next appointment:   In Person  Provider:   Epifanio Lesches, MD         Signed, Little Ishikawa, MD  12/15/2019 7:34 PM    [facility]

## 2019-12-15 NOTE — Progress Notes (Signed)
Established Patient Office Visit  Subjective:  Patient ID: Random Jonathon Johnson, male    DOB: [birth year]  Age: 74 y.o. MRN: [id number]  CC:  Chief Complaint  Patient presents with  . Hospitalization Follow-up    pt here for hospital f/u sen for tachycardia, noconcerns.    HPI Jonathon Johnson presents for hospital follow-up with tachycardia.  Had developed elevated blood pressure with a tachycardia.  Was seen and released after symptoms.  Had enjoyed 3 to 4 glasses of wine that night.  He has done well with the change to losartan 100 mg and metoprolol 50 mg.  Past Medical History:  Diagnosis Date  . Asthma   . Chicken pox   . GERD (gastroesophageal reflux disease)   . Hyperlipidemia   . Hypertension     Past Surgical History:  Procedure Laterality Date  . CHOLECYSTECTOMY      Family History  Problem Relation Age of Onset  . Alcohol abuse Mother   . Depression Mother   . Diabetes Mother   . Alcohol abuse Father   . COPD Father   . Cancer Sister   . Hypertension Brother     Social History   Socioeconomic History  . Marital status: Married    Spouse name: Not on file  . Number of children: Not on file  . Years of education: Not on file  . Highest education level: Not on file  Occupational History  . Not on file  Tobacco Use  . Smoking status: Former Smoker    Quit date: 07/19/2010    Years since quitting: 9.4  . Smokeless tobacco: Never Used  Substance and Sexual Activity  . Alcohol use: Yes    Alcohol/week: 14.0 standard drinks    Types: 14 Glasses of wine per week    Comment: 3-4 glasses of wine daily  . Drug use: Never  . Sexual activity: Yes    Partners: Female  Other Topics Concern  . Not on file  Social History Narrative  . Not on file   Social Determinants of Health   Financial Resource Strain:   . Difficulty of Paying Living Expenses:   Food Insecurity:   . Worried About Programme researcher, broadcasting/film/video in the Last Year:   . Barista in the Last Year:     Transportation Needs:   . Freight forwarder (Medical):   [name] Kitchen Lack of Transportation (Non-Medical):   Physical Activity:   . Days of Exercise per Week:   . Minutes of Exercise per Session:   Stress:   . Feeling of Stress :   Social Connections:   . Frequency of Communication with Friends and Family:   . Frequency of Social Gatherings with Friends and Family:   . Attends Religious Services:   . Active Member of Clubs or Organizations:   . Attends Banker Meetings:   [name] Kitchen Marital Status:   Intimate Partner Violence:   . Fear of Current or Ex-Partner:   . Emotionally Abused:   [name] Kitchen Physically Abused:   . Sexually Abused:     Outpatient Medications Prior to Visit  Medication Sig Dispense Refill  . amitriptyline (ELAVIL) 25 MG tablet TAKE 1 TABLET AT BEDTIME 90 tablet 3  . diclofenac Sodium (VOLTAREN) 1 % GEL Apply a small amount to painful joints on hand three times daily as needed. 100 g 2  . finasteride (PROSCAR) 5 MG tablet TAKE 1 TABLET DAILY 90 tablet 0  . fluticasone (FLONASE) 50  MCG/ACT nasal spray USE 2 SPRAYS IN EACH NOSTRIL DAILY 48 g 3  . Fluticasone-Salmeterol (ADVAIR DISKUS) 250-50 MCG/DOSE AEPB Inhale 1 puff into the lungs 2 (two) times daily. 3 each 3  . ibuprofen (ADVIL,MOTRIN) 200 MG tablet Take 200 mg by mouth as needed.    [name] Kitchen losartan (COZAAR) 100 MG tablet Take 1 tablet (100 mg total) by mouth daily. 90 tablet 3  . metoprolol tartrate (LOPRESSOR) 50 MG tablet Take 1 tablet (50 mg total) by mouth 2 (two) times daily. 180 tablet 3  . Multiple Vitamin (MULTIVITAMIN) tablet Take 1 tablet by mouth daily.    [name] Kitchen omeprazole (PRILOSEC) 40 MG capsule Take 1 capsule (40 mg total) by mouth daily. 90 capsule 3  . tamsulosin (FLOMAX) 0.4 MG CAPS capsule TAKE 2 CAPSULES DAILY AFTER BREAKFAST 180 capsule 3   No facility-administered medications prior to visit.    Allergies  Allergen Reactions  . Amoxicillin Diarrhea  . Morphine And Related Nausea And Vomiting     ROS Review of Systems  Constitutional: Negative.   Respiratory: Negative.  Negative for shortness of breath.   Cardiovascular: Negative.  Negative for chest pain and palpitations.  Gastrointestinal: Negative.   Psychiatric/Behavioral: Negative.       Objective:    Physical Exam Vitals and nursing note reviewed.  Constitutional:      General: He is not in acute distress.    Appearance: Normal appearance. He is normal weight. He is not ill-appearing, toxic-appearing or diaphoretic.  HENT:     Head: Normocephalic and atraumatic.  Eyes:     General: No scleral icterus.       Right eye: No discharge.        Left eye: No discharge.     Conjunctiva/sclera: Conjunctivae normal.  Cardiovascular:     Rate and Rhythm: Normal rate and regular rhythm.  Pulmonary:     Effort: Pulmonary effort is normal.     Breath sounds: Normal breath sounds.  Skin:    General: Skin is warm and dry.  Neurological:     Mental Status: He is alert and oriented to person, place, and time.  Psychiatric:        Mood and Affect: Mood normal.        Behavior: Behavior normal.     BP 124/78   Pulse 61   Temp 97.7 F (36.5 C) (Tympanic)   Ht 5\' 7"  (1.702 m)   Wt 190 lb 6.4 oz (86.4 kg)   SpO2 95%   BMI 29.82 kg/m  Wt Readings from Last 3 Encounters:  12/15/19 190 lb 6.4 oz (86.4 kg)  12/15/19 190 lb 12.8 oz (86.5 kg)  11/29/19 191 lb 9.6 oz (86.9 kg)   The 10-year ASCVD risk score 12/01/19 DC Jr., et al., 2013) is: 21.3%   Values used to calculate the score:     Age: 22 years     Sex: Male     Is Non-Hispanic African American: No     Diabetic: No     Tobacco smoker: No     Systolic Blood Pressure: 124 mmHg     Is BP treated: Yes     HDL Cholesterol: 65.4 mg/dL     Total Cholesterol: 188 mg/dL  There are no preventive care reminders to display for this patient.  There are no preventive care reminders to display for this patient.  Lab Results  Component Value Date   TSH 1.462  12/08/2019   Lab Results  Component Value  Date   WBC 6.6 12/07/2019   HGB 14.4 12/07/2019   HCT 43.2 12/07/2019   MCV 94.3 12/07/2019   PLT 256 12/07/2019   Lab Results  Component Value Date   NA 138 12/07/2019   K 3.6 12/07/2019   CO2 21 (L) 12/07/2019   GLUCOSE 124 (H) 12/07/2019   BUN 12 12/07/2019   CREATININE 0.85 12/07/2019   BILITOT 0.5 06/28/2019   ALKPHOS 60 06/28/2019   AST 29 06/28/2019   ALT 32 06/28/2019   PROT 6.9 06/28/2019   ALBUMIN 4.0 06/28/2019   CALCIUM 9.2 12/07/2019   ANIONGAP 13 12/07/2019   GFR 80.50 11/29/2019   Lab Results  Component Value Date   CHOL 188 11/29/2019   Lab Results  Component Value Date   HDL 65.40 11/29/2019   Lab Results  Component Value Date   LDLCALC 108 (H) 11/29/2019   Lab Results  Component Value Date   TRIG 71.0 11/29/2019   Lab Results  Component Value Date   CHOLHDL 3 11/29/2019   No results found for: HGBA1C    Assessment & Plan:   Problem List Items Addressed This Visit    None      No orders of the defined types were placed in this encounter.   Follow-up: Return in about 3 months (around 03/16/2020).  Counseled patient to moderate alcohol use.  Continue losartan and metoprolol.  Discussed his elevated ASCVD risk.  May consider a statin in the future. Mliss Sax, MD

## 2019-12-15 NOTE — Progress Notes (Signed)
Patient ID: Jonathon Johnson, male   DOB: [birth year], 74 y.o.   MRN: [id number]  Patient enrolled for [employer] to ship a 7 day ZIO XT long term holter monitor to his home.

## 2019-12-20 ENCOUNTER — Ambulatory Visit (INDEPENDENT_AMBULATORY_CARE_PROVIDER_SITE_OTHER): Payer: Medicare Other

## 2019-12-20 DIAGNOSIS — R002 Palpitations: Secondary | ICD-10-CM

## 2019-12-22 ENCOUNTER — Encounter: Payer: Self-pay | Admitting: Family Medicine

## 2019-12-22 NOTE — Telephone Encounter (Signed)
I think that patient needs to follow up with cardiology for the tachycardia but needs to see me for evaluation of anxiety.

## 2019-12-31 ENCOUNTER — Other Ambulatory Visit: Payer: Self-pay | Admitting: Family Medicine

## 2019-12-31 DIAGNOSIS — M79644 Pain in right finger(s): Secondary | ICD-10-CM

## 2020-01-02 ENCOUNTER — Other Ambulatory Visit: Payer: Self-pay

## 2020-01-02 DIAGNOSIS — M79644 Pain in right finger(s): Secondary | ICD-10-CM

## 2020-01-02 MED ORDER — DICLOFENAC SODIUM 1 % EX GEL: CUTANEOUS | 2 refills | Status: DC

## 2020-01-04 ENCOUNTER — Other Ambulatory Visit: Payer: Self-pay

## 2020-01-04 ENCOUNTER — Ambulatory Visit (HOSPITAL_COMMUNITY): Payer: Medicare Other | Attending: Cardiology

## 2020-01-04 DIAGNOSIS — R000 Tachycardia, unspecified: Secondary | ICD-10-CM | POA: Insufficient documentation

## 2020-01-04 DIAGNOSIS — R0602 Shortness of breath: Secondary | ICD-10-CM | POA: Diagnosis not present

## 2020-01-04 DIAGNOSIS — R002 Palpitations: Secondary | ICD-10-CM | POA: Diagnosis not present

## 2020-01-04 LAB — ECHOCARDIOGRAM COMPLETE
Area-P 1/2: 2.83 cm2
S' Lateral: 3.3 cm

## 2020-01-12 DIAGNOSIS — R002 Palpitations: Secondary | ICD-10-CM | POA: Diagnosis not present

## 2020-01-25 ENCOUNTER — Other Ambulatory Visit: Payer: Self-pay | Admitting: Family Medicine

## 2020-01-25 DIAGNOSIS — N401 Enlarged prostate with lower urinary tract symptoms: Secondary | ICD-10-CM

## 2020-01-25 DIAGNOSIS — R3911 Hesitancy of micturition: Secondary | ICD-10-CM

## 2020-02-09 ENCOUNTER — Encounter: Payer: Self-pay | Admitting: Family Medicine

## 2020-02-09 NOTE — Telephone Encounter (Signed)
I think that patient should have both. They don't need to be on the same day.

## 2020-02-14 DIAGNOSIS — Z23 Encounter for immunization: Secondary | ICD-10-CM | POA: Diagnosis not present

## 2020-02-15 ENCOUNTER — Encounter: Payer: Self-pay | Admitting: Family Medicine

## 2020-02-17 ENCOUNTER — Encounter: Payer: Self-pay | Admitting: Family Medicine

## 2020-02-17 ENCOUNTER — Other Ambulatory Visit: Payer: Self-pay | Admitting: Family Medicine

## 2020-02-17 DIAGNOSIS — R059 Cough, unspecified: Secondary | ICD-10-CM

## 2020-02-17 NOTE — Telephone Encounter (Signed)
Should be okay 

## 2020-03-12 DIAGNOSIS — Z23 Encounter for immunization: Secondary | ICD-10-CM | POA: Diagnosis not present

## 2020-03-15 ENCOUNTER — Other Ambulatory Visit: Payer: Self-pay

## 2020-03-15 NOTE — Progress Notes (Signed)
Cardiology Office Note:    Date:  03/16/2020   ID:  Jonathon Johnson, DOB 05/23/1946, MRN [id number]  PCP:  Mliss SaxKremer, William Alfred, MD  Cardiologist:  No primary care provider on file.  Electrophysiologist:  None   Referring MD: Mliss SaxKremer, William Alfred,*   Chief Complaint  Patient presents with  . Palpitations    History of Present Illness:    Jonathon Johnson is a 74 y.o. male with a hx of asthma, hypertension, hyperlipidemia, GERD who presents for follow-up. He was referred by Dr. Doreene BurkeKremer for evaluation of tachycardia, initially seen on 12/15/2019. He was seen in the ED on 12/07/2019 for tachycardia.  He reported having palpitations and feeling like his heart was racing.  Checked his pulse and it was in the 130s.  In the ED, pulse remained in 120s, sinus tachycardia.  Lab work showed normal electrolytes,TSH, and negative troponin x2.  Age-adjusted D-dimer was negative.  He was given IV fluids, p.o. Ativan and IV metoprolol.  Heart rate improved to 80s to 90s.  He reports that he has palpitations most nights where he feels like his heart is racing.  Typically last for at least 30 minutes.  The episode that brought him to the ER was increased severity and duration of symptoms.  He has had lightheadedness during these episodes but denies any syncope.  States that he has been taking beta-blocker for around 30 years.  He exercises by going for walks every morning for about 25 minutes.  He denies any chest pain or shortness of breath outside of tachycardia episodes.  No lower extremity edema.  States that he smoked 1 pack/day x 50 years, but quit in 2012.  Drinks 3.5 to 5 glasses of wine per night.  No family history of heart disease.  Echocardiogram on 01/04/2020 showed normal biventricular function, no significant valvular disease. Zio patch x7 days on 01/12/2020 showed 23 episodes of SVT, longest lasting 17 beats. Patient triggered events corresponded to sinus rhythm  PACs/PVCs.  Since last clinic visit,  he reports his palpitations have improved.  Having palpitations about once per week for few minutes.  Does report some intermittent lightheadedness with standing.  Denies any syncope.  Denies any chest pain or dyspnea.  Reports has been going to the gym for the last 3 weeks, to stationary bike and walks on treadmill.  Denies any exertional symptoms.   BP Readings from Last 3 Encounters:  03/16/20 (!) 142/82  12/15/19 124/78  12/15/19 125/76     Past Medical History:  Diagnosis Date  . Asthma   . Chicken pox   . GERD (gastroesophageal reflux disease)   . Hyperlipidemia   . Hypertension     Past Surgical History:  Procedure Laterality Date  . CHOLECYSTECTOMY      Current Medications: Current Meds  Medication Sig  . amitriptyline (ELAVIL) 25 MG tablet TAKE 1 TABLET AT BEDTIME  . diclofenac Sodium (VOLTAREN) 1 % GEL Apply a small amount to painful joints on hand three times daily as needed.  . finasteride (PROSCAR) 5 MG tablet TAKE 1 TABLET DAILY  . fluticasone (FLONASE) 50 MCG/ACT nasal spray USE 2 SPRAYS IN EACH NOSTRIL DAILY  . Fluticasone-Salmeterol (ADVAIR) 250-50 MCG/DOSE AEPB Inhale 1 puff into the lungs daily.  [name] Kitchen. ibuprofen (ADVIL) 600 MG tablet Take 600 mg by mouth daily.  [name] Kitchen. losartan (COZAAR) 100 MG tablet Take 1 tablet (100 mg total) by mouth daily.  . metoprolol tartrate (LOPRESSOR) 50 MG tablet Take 1 tablet (50 mg total)  by mouth 2 (two) times daily.  . Multiple Vitamin (MULTIVITAMIN) tablet Take 1 tablet by mouth daily.  [name] Kitchen omeprazole (PRILOSEC) 40 MG capsule Take 1 capsule (40 mg total) by mouth daily.  . tamsulosin (FLOMAX) 0.4 MG CAPS capsule TAKE 2 CAPSULES DAILY AFTER BREAKFAST  . [DISCONTINUED] ADVAIR DISKUS 250-50 MCG/DOSE AEPB USE 1 INHALATION TWICE A DAY  . [DISCONTINUED] ibuprofen (ADVIL,MOTRIN) 200 MG tablet Take 200 mg by mouth as needed.     Allergies:   Amoxicillin and Morphine and related   Social History   Socioeconomic History  . Marital status:  Married    Spouse name: Not on file  . Number of children: Not on file  . Years of education: Not on file  . Highest education level: Not on file  Occupational History  . Not on file  Tobacco Use  . Smoking status: Former Smoker    Quit date: 07/19/2010    Years since quitting: 9.6  . Smokeless tobacco: Never Used  Substance and Sexual Activity  . Alcohol use: Yes    Alcohol/week: 14.0 standard drinks    Types: 14 Glasses of wine per week    Comment: 3-4 glasses of wine daily  . Drug use: Never  . Sexual activity: Yes    Partners: Female  Other Topics Concern  . Not on file  Social History Narrative  . Not on file   Social Determinants of Health   Financial Resource Strain:   . Difficulty of Paying Living Expenses: Not on file  Food Insecurity:   . Worried About Programme researcher, broadcasting/film/video in the Last Year: Not on file  . Ran Out of Food in the Last Year: Not on file  Transportation Needs:   . Lack of Transportation (Medical): Not on file  . Lack of Transportation (Non-Medical): Not on file  Physical Activity:   . Days of Exercise per Week: Not on file  . Minutes of Exercise per Session: Not on file  Stress:   . Feeling of Stress : Not on file  Social Connections:   . Frequency of Communication with Friends and Family: Not on file  . Frequency of Social Gatherings with Friends and Family: Not on file  . Attends Religious Services: Not on file  . Active Member of Clubs or Organizations: Not on file  . Attends Banker Meetings: Not on file  . Marital Status: Not on file     Family History: The patient's family history includes Alcohol abuse in his father and mother; COPD in his father; Cancer in his sister; Depression in his mother; Diabetes in his mother; Hypertension in his brother.  ROS:   Please see the history of present illness.     All other systems reviewed and are negative.  EKGs/Labs/Other Studies Reviewed:    The following studies were reviewed  today:   EKG:  EKG is not ordered today.  The ekg ordered at prior clinic visit demonstrates normal sinus rhythm, rate 70, no ST/T abnormalities  Recent Labs: 06/28/2019: ALT 32 12/07/2019: BUN 12; Creatinine, Ser 0.85; Hemoglobin 14.4; Platelets 256; Potassium 3.6; Sodium 138 12/08/2019: TSH 1.462  Recent Lipid Panel    Component Value Date/Time   CHOL 188 11/29/2019 1126   TRIG 71.0 11/29/2019 1126   HDL 65.40 11/29/2019 1126   CHOLHDL 3 11/29/2019 1126   VLDL 14.2 11/29/2019 1126   LDLCALC 108 (H) 11/29/2019 1126   LDLDIRECT 108.0 11/29/2019 1126    Physical Exam:  VS:  BP (!) 142/82   Pulse 71   Ht 5\' 7"  (1.702 m)   Wt 191 lb (86.6 kg)   SpO2 98%   BMI 29.91 kg/m     Wt Readings from Last 3 Encounters:  03/16/20 191 lb (86.6 kg)  12/15/19 190 lb 6.4 oz (86.4 kg)  12/15/19 190 lb 12.8 oz (86.5 kg)     GEN:  in no acute distress HEENT: Normal NECK: No JVD; No carotid bruits CARDIAC: RRR, no murmurs, rubs, gallops RESPIRATORY:  Clear to auscultation without rales, wheezing or rhonchi  ABDOMEN: Soft, non-tender, non-distended MUSCULOSKELETAL:  No edema; No deformity  SKIN: Warm and dry NEUROLOGIC:  Alert and oriented x 3 PSYCHIATRIC:  Normal affect   ASSESSMENT:    1. Palpitations   2. Tachycardia   3. Essential hypertension   4. Lightheadedness   5. Hyperlipidemia, unspecified hyperlipidemia type    PLAN:    Tachycardia/palpitations/lightheadedness: Description concerning for arrhythmia.  Echocardiogram on 01/04/2020 showed normal biventricular function, no significant valvular disease. Zio patch x7 days on 01/12/2020 showed 23 episodes of SVT, longest lasting 17 beats. Patient triggered events corresponded to sinus rhythm  PACs/PVCs..  Suspect alcohol use (3.5 to 5 glasses of wine per night) could be contributing, recommended cutting back on alcohol intake.  He denies any history of withdrawal symptoms.  Continue metoprolol 50 mg twice daily.  Reports  palpitations have improved.  Hypertension: On losartan 100 mg daily and metoprolol 50 mg twice daily.  Mildly elevated in clinic today, recommend checking BP daily for next 2 weeks and calling with results.  Hyperlipidemia: 10-year ASCVD risk score 21%. LDL 108 on 11/29/2019. Recommend starting statin, will start rosuvastatin 10 mg daily  RTC in 1 year  Medication Adjustments/Labs and Tests Ordered: Current medicines are reviewed at length with the patient today.  Concerns regarding medicines are outlined above.  No orders of the defined types were placed in this encounter.  Meds ordered this encounter  Medications  . rosuvastatin (CRESTOR) 10 MG tablet    Sig: Take 1 tablet (10 mg total) by mouth daily.    Dispense:  90 tablet    Refill:  3    Patient Instructions  Medication Instructions:  START rosuvastatin (Crestor) 10 mg daily  *If you need a refill on your cardiac medications before your next appointment, please call your pharmacy*  Follow-Up: At [facility], you and your health needs are our priority.  As part of our continuing mission to provide you with exceptional heart care, we have created designated Provider Care Teams.  These Care Teams include your primary Cardiologist (physician) and Advanced Practice Providers (APPs -  Physician Assistants and Nurse Practitioners) who all work together to provide you with the care you need, when you need it.  We recommend signing up for the patient portal called "MyChart".  Sign up information is provided on this After Visit Summary.  MyChart is used to connect with patients for Virtual Visits (Telemedicine).  Patients are able to view lab/test results, encounter notes, upcoming appointments, etc.  Non-urgent messages can be sent to your provider as well.   To learn more about what you can do with MyChart, go to [facility].    Your next appointment:   12 month(s)  The format for your next appointment:   In  Person  Provider:   [email or web address], MD   Other Instructions Please check your blood pressure at home daily, write it down.  Call the office or  send message via Mychart with the readings in 2 weeks for Dr. Bjorn Pippin to review.       Signed, Little Ishikawa, MD  03/16/2020 10:16 AM    [facility]

## 2020-03-16 ENCOUNTER — Encounter: Payer: Self-pay | Admitting: Cardiology

## 2020-03-16 ENCOUNTER — Ambulatory Visit (INDEPENDENT_AMBULATORY_CARE_PROVIDER_SITE_OTHER): Payer: Medicare Other | Admitting: Cardiology

## 2020-03-16 ENCOUNTER — Encounter: Payer: Self-pay | Admitting: Family Medicine

## 2020-03-16 ENCOUNTER — Ambulatory Visit (INDEPENDENT_AMBULATORY_CARE_PROVIDER_SITE_OTHER): Payer: Medicare Other | Admitting: Family Medicine

## 2020-03-16 VITALS — BP 146/80 | HR 64 | Temp 97.8°F | Ht 67.0 in | Wt 191.0 lb

## 2020-03-16 VITALS — BP 142/82 | HR 71 | Ht 67.0 in | Wt 191.0 lb

## 2020-03-16 DIAGNOSIS — R000 Tachycardia, unspecified: Secondary | ICD-10-CM | POA: Diagnosis not present

## 2020-03-16 DIAGNOSIS — Z7289 Other problems related to lifestyle: Secondary | ICD-10-CM

## 2020-03-16 DIAGNOSIS — E785 Hyperlipidemia, unspecified: Secondary | ICD-10-CM

## 2020-03-16 DIAGNOSIS — R002 Palpitations: Secondary | ICD-10-CM

## 2020-03-16 DIAGNOSIS — I10 Essential (primary) hypertension: Secondary | ICD-10-CM

## 2020-03-16 DIAGNOSIS — R42 Dizziness and giddiness: Secondary | ICD-10-CM

## 2020-03-16 DIAGNOSIS — Z789 Other specified health status: Secondary | ICD-10-CM | POA: Insufficient documentation

## 2020-03-16 MED ORDER — ROSUVASTATIN CALCIUM 10 MG PO TABS: 10.0000 mg | ORAL_TABLET | Freq: Every day | ORAL | 3 refills | Status: DC

## 2020-03-16 NOTE — Progress Notes (Signed)
Established Patient Office Visit  Subjective:  Patient ID: Jonathon Johnson, male    DOB: 1946/02/20  Age: 74 y.o. MRN: [id number]  CC:  Chief Complaint  Patient presents with  . Follow-up    3 month follow up, no concerns     HPI Jonathon Johnson presents for follow-up of hypertension and tachycardia.  Recent cardiology consultation.  He was advised to check his blood pressures over the next few weeks and send an average.  Brings in a letter from his online pharmacy provider stating that his current losartan may be tainted with a possible carcinogen.  Past Medical History:  Diagnosis Date  . Asthma   . Chicken pox   . GERD (gastroesophageal reflux disease)   . Hyperlipidemia   . Hypertension     Past Surgical History:  Procedure Laterality Date  . CHOLECYSTECTOMY      Family History  Problem Relation Age of Onset  . Alcohol abuse Mother   . Depression Mother   . Diabetes Mother   . Alcohol abuse Father   . COPD Father   . Cancer Sister   . Hypertension Brother     Social History   Socioeconomic History  . Marital status: Married    Spouse name: Not on file  . Number of children: Not on file  . Years of education: Not on file  . Highest education level: Not on file  Occupational History  . Not on file  Tobacco Use  . Smoking status: Former Smoker    Quit date: 07/19/2010    Years since quitting: 9.6  . Smokeless tobacco: Never Used  Substance and Sexual Activity  . Alcohol use: Yes    Alcohol/week: 14.0 standard drinks    Types: 14 Glasses of wine per week    Comment: 3-4 glasses of wine daily  . Drug use: Never  . Sexual activity: Yes    Partners: Female  Other Topics Concern  . Not on file  Social History Narrative  . Not on file   Social Determinants of Health   Financial Resource Strain:   . Difficulty of Paying Living Expenses: Not on file  Food Insecurity:   . Worried About Programme researcher, broadcasting/film/video in the Last Year: Not on file  . Ran Out of Food  in the Last Year: Not on file  Transportation Needs:   . Lack of Transportation (Medical): Not on file  . Lack of Transportation (Non-Medical): Not on file  Physical Activity:   . Days of Exercise per Week: Not on file  . Minutes of Exercise per Session: Not on file  Stress:   . Feeling of Stress : Not on file  Social Connections:   . Frequency of Communication with Friends and Family: Not on file  . Frequency of Social Gatherings with Friends and Family: Not on file  . Attends Religious Services: Not on file  . Active Member of Clubs or Organizations: Not on file  . Attends Banker Meetings: Not on file  . Marital Status: Not on file  Intimate Partner Violence:   . Fear of Current or Ex-Partner: Not on file  . Emotionally Abused: Not on file  . Physically Abused: Not on file  . Sexually Abused: Not on file    Outpatient Medications Prior to Visit  Medication Sig Dispense Refill  . amitriptyline (ELAVIL) 25 MG tablet TAKE 1 TABLET AT BEDTIME 90 tablet 3  . finasteride (PROSCAR) 5 MG tablet TAKE 1 TABLET  DAILY 90 tablet 3  . fluticasone (FLONASE) 50 MCG/ACT nasal spray USE 2 SPRAYS IN EACH NOSTRIL DAILY 48 g 3  . Fluticasone-Salmeterol (ADVAIR) 250-50 MCG/DOSE AEPB Inhale 1 puff into the lungs daily.    [name] Kitchen ibuprofen (ADVIL) 600 MG tablet Take 600 mg by mouth daily.    [name] Kitchen losartan (COZAAR) 100 MG tablet Take 1 tablet (100 mg total) by mouth daily. 90 tablet 3  . metoprolol tartrate (LOPRESSOR) 50 MG tablet Take 1 tablet (50 mg total) by mouth 2 (two) times daily. 180 tablet 3  . Multiple Vitamin (MULTIVITAMIN) tablet Take 1 tablet by mouth daily.    [name] Kitchen omeprazole (PRILOSEC) 40 MG capsule Take 1 capsule (40 mg total) by mouth daily. 90 capsule 3  . tamsulosin (FLOMAX) 0.4 MG CAPS capsule TAKE 2 CAPSULES DAILY AFTER BREAKFAST 180 capsule 3  . diclofenac Sodium (VOLTAREN) 1 % GEL Apply a small amount to painful joints on hand three times daily as needed. (Patient not taking:  Reported on 03/16/2020) 100 g 2  . rosuvastatin (CRESTOR) 10 MG tablet Take 1 tablet (10 mg total) by mouth daily. (Patient not taking: Reported on 03/16/2020) 90 tablet 3   No facility-administered medications prior to visit.    Allergies  Allergen Reactions  . Amoxicillin Diarrhea  . Morphine And Related Nausea And Vomiting    ROS Review of Systems  Constitutional: Negative.   Respiratory: Negative.   Cardiovascular: Negative.   Gastrointestinal: Negative.   Psychiatric/Behavioral: Negative.       Objective:    Physical Exam Vitals and nursing note reviewed.  Constitutional:      General: He is not in acute distress.    Appearance: Normal appearance. He is not ill-appearing, toxic-appearing or diaphoretic.  HENT:     Head: Normocephalic and atraumatic.     Right Ear: External ear normal.     Left Ear: External ear normal.  Eyes:     General: No scleral icterus.       Right eye: No discharge.        Left eye: No discharge.     Conjunctiva/sclera: Conjunctivae normal.  Cardiovascular:     Rate and Rhythm: Normal rate and regular rhythm.  Pulmonary:     Effort: Pulmonary effort is normal.     Breath sounds: Normal breath sounds.  Neurological:     Mental Status: He is alert and oriented to person, place, and time.  Psychiatric:        Mood and Affect: Mood normal.        Behavior: Behavior normal.     BP (!) 146/80   Pulse 64   Temp 97.8 F (36.6 C) (Tympanic)   Ht 5\' 7"  (1.702 m)   Wt 191 lb (86.6 kg)   SpO2 97%   BMI 29.91 kg/m  Wt Readings from Last 3 Encounters:  03/16/20 191 lb (86.6 kg)  03/16/20 191 lb (86.6 kg)  12/15/19 190 lb 6.4 oz (86.4 kg)     There are no preventive care reminders to display for this patient.  There are no preventive care reminders to display for this patient.  Lab Results  Component Value Date   TSH 1.462 12/08/2019   Lab Results  Component Value Date   WBC 6.6 12/07/2019   HGB 14.4 12/07/2019   HCT 43.2  12/07/2019   MCV 94.3 12/07/2019   PLT 256 12/07/2019   Lab Results  Component Value Date   NA 138 12/07/2019   K 3.6  12/07/2019   CO2 21 (L) 12/07/2019   GLUCOSE 124 (H) 12/07/2019   BUN 12 12/07/2019   CREATININE 0.85 12/07/2019   BILITOT 0.5 06/28/2019   ALKPHOS 60 06/28/2019   AST 29 06/28/2019   ALT 32 06/28/2019   PROT 6.9 06/28/2019   ALBUMIN 4.0 06/28/2019   CALCIUM 9.2 12/07/2019   ANIONGAP 13 12/07/2019   GFR 80.50 11/29/2019   Lab Results  Component Value Date   CHOL 188 11/29/2019   Lab Results  Component Value Date   HDL 65.40 11/29/2019   Lab Results  Component Value Date   LDLCALC 108 (H) 11/29/2019   Lab Results  Component Value Date   TRIG 71.0 11/29/2019   Lab Results  Component Value Date   CHOLHDL 3 11/29/2019   No results found for: HGBA1C    Assessment & Plan:   Problem List Items Addressed This Visit      Cardiovascular and Mediastinum   Essential hypertension - Primary     Other   Alcohol use      No orders of the defined types were placed in this encounter.   Follow-up: Return check and record bp over next few weeks and let me know..  BP elevated today on his current regimen.  He is not did drink wine last time.  He admits that he continues to drink more wine than he probably should.  He is stressed over his wife's health.  Will check and record his blood pressures over the next few weeks and let me know.  Suggested that he stop alcohol altogether.  Suspect that he may be using it has an inappropriate coping mechanism.  We will send in a new prescription of losartan when he lets me know about where his blood pressures have been running.  Mliss Sax, MD

## 2020-03-16 NOTE — Patient Instructions (Signed)
   Managing Your Hypertension Hypertension is commonly called high blood pressure. This is when the force of your blood pressing against the walls of your arteries is too strong. Arteries are blood vessels that carry blood from your heart throughout your body. Hypertension forces the heart to work harder to pump blood, and may cause the arteries to become narrow or stiff. Having untreated or uncontrolled hypertension can cause heart attack, stroke, kidney disease, and other problems. What are blood pressure readings? A blood pressure reading consists of a higher number over a lower number. Ideally, your blood pressure should be below 120/80. The first ("top") number is called the systolic pressure. It is a measure of the pressure in your arteries as your heart beats. The second ("bottom") number is called the diastolic pressure. It is a measure of the pressure in your arteries as the heart relaxes. What does my blood pressure reading mean? Blood pressure is classified into four stages. Based on your blood pressure reading, your health care provider may use the following stages to determine what type of treatment you need, if any. Systolic pressure and diastolic pressure are measured in a unit called mm Hg. Normal  Systolic pressure: below 120.  Diastolic pressure: below 80. Elevated  Systolic pressure: 120-129.  Diastolic pressure: below 80. Hypertension stage 1  Systolic pressure: 130-139.  Diastolic pressure: 80-89. Hypertension stage 2  Systolic pressure: 140 or above.  Diastolic pressure: 90 or above. What health risks are associated with hypertension? Managing your hypertension is an important responsibility. Uncontrolled hypertension can lead to:  A heart attack.  A stroke.  A weakened blood vessel (aneurysm).  Heart failure.  Kidney damage.  Eye damage.  Metabolic syndrome.  Memory and concentration problems. What changes can I make to manage my  hypertension? Hypertension can be managed by making lifestyle changes and possibly by taking medicines. Your health care provider will help you make a plan to bring your blood pressure within a normal range. Eating and drinking   Eat a diet that is high in fiber and potassium, and low in salt (sodium), added sugar, and fat. An example eating plan is called the DASH (Dietary Approaches to Stop Hypertension) diet. To eat this way: ? Eat plenty of fresh fruits and vegetables. Try to fill half of your plate at each meal with fruits and vegetables. ? Eat whole grains, such as whole wheat pasta, brown rice, or whole grain bread. Fill about one quarter of your plate with whole grains. ? Eat low-fat diary products. ? Avoid fatty cuts of meat, processed or cured meats, and poultry with skin. Fill about one quarter of your plate with lean proteins such as fish, chicken without skin, beans, eggs, and tofu. ? Avoid premade and processed foods. These tend to be higher in sodium, added sugar, and fat.  Reduce your daily sodium intake. Most people with hypertension should eat less than 1,500 mg of sodium a day.  Limit alcohol intake to no more than 1 drink a day for nonpregnant women and 2 drinks a day for men. One drink equals 12 oz of beer, 5 oz of wine, or 1 oz of hard liquor. Lifestyle  Work with your health care provider to maintain a healthy body weight, or to lose weight. Ask what an ideal weight is for you.  Get at least 30 minutes of exercise that causes your heart to beat faster (aerobic exercise) most days of the week. Activities may include walking, swimming, or biking.    Include exercise to strengthen your muscles (resistance exercise), such as weight lifting, as part of your weekly exercise routine. Try to do these types of exercises for 30 minutes at least 3 days a week.  Do not use any products that contain nicotine or tobacco, such as cigarettes and e-cigarettes. If you need help quitting,  ask your health care provider.  Control any long-term (chronic) conditions you have, such as high cholesterol or diabetes. Monitoring  Monitor your blood pressure at home as told by your health care provider. Your personal target blood pressure may vary depending on your medical conditions, your age, and other factors.  Have your blood pressure checked regularly, as often as told by your health care provider. Working with your health care provider  Review all the medicines you take with your health care provider because there may be side effects or interactions.  Talk with your health care provider about your diet, exercise habits, and other lifestyle factors that may be contributing to hypertension.  Visit your health care provider regularly. Your health care provider can help you create and adjust your plan for managing hypertension. Will I need medicine to control my blood pressure? Your health care provider may prescribe medicine if lifestyle changes are not enough to get your blood pressure under control, and if:  Your systolic blood pressure is 130 or higher.  Your diastolic blood pressure is 80 or higher. Take medicines only as told by your health care provider. Follow the directions carefully. Blood pressure medicines must be taken as prescribed. The medicine does not work as well when you skip doses. Skipping doses also puts you at risk for problems. Contact a health care provider if:  You think you are having a reaction to medicines you have taken.  You have repeated (recurrent) headaches.  You feel dizzy.  You have swelling in your ankles.  You have trouble with your vision. Get help right away if:  You develop a severe headache or confusion.  You have unusual weakness or numbness, or you feel faint.  You have severe pain in your chest or abdomen.  You vomit repeatedly.  You have trouble breathing. Summary  Hypertension is when the force of blood pumping  through your arteries is too strong. If this condition is not controlled, it may put you at risk for serious complications.  Your personal target blood pressure may vary depending on your medical conditions, your age, and other factors. For most people, a normal blood pressure is less than 120/80.  Hypertension is managed by lifestyle changes, medicines, or both. Lifestyle changes include weight loss, eating a healthy, low-sodium diet, exercising more, and limiting alcohol. This information is not intended to replace advice given to you by your health care provider. Make sure you discuss any questions you have with your health care provider. Document Revised: 09/17/2018 Document Reviewed: 04/23/2016 Elsevier Patient Education  2020 [employer]  

## 2020-03-16 NOTE — Patient Instructions (Signed)
Medication Instructions:  START rosuvastatin (Crestor) 10 mg daily  *If you need a refill on your cardiac medications before your next appointment, please call your pharmacy*  Follow-Up: At [facility], you and your health needs are our priority.  As part of our continuing mission to provide you with exceptional heart care, we have created designated Provider Care Teams.  These Care Teams include your primary Cardiologist (physician) and Advanced Practice Providers (APPs -  Physician Assistants and Nurse Practitioners) who all work together to provide you with the care you need, when you need it.  We recommend signing up for the patient portal called "MyChart".  Sign up information is provided on this After Visit Summary.  MyChart is used to connect with patients for Virtual Visits (Telemedicine).  Patients are able to view lab/test results, encounter notes, upcoming appointments, etc.  Non-urgent messages can be sent to your provider as well.   To learn more about what you can do with MyChart, go to [email or web address].    Your next appointment:   12 month(s)  The format for your next appointment:   In Person  Provider:   Epifanio Lesches, MD   Other Instructions Please check your blood pressure at home daily, write it down.  Call the office or send message via Mychart with the readings in 2 weeks for Dr. Bjorn Pippin to review.

## 2020-03-21 ENCOUNTER — Other Ambulatory Visit: Payer: Self-pay | Admitting: Family Medicine

## 2020-03-21 DIAGNOSIS — K219 Gastro-esophageal reflux disease without esophagitis: Secondary | ICD-10-CM

## 2020-03-21 DIAGNOSIS — R059 Cough, unspecified: Secondary | ICD-10-CM

## 2020-04-01 ENCOUNTER — Encounter: Payer: Self-pay | Admitting: Family Medicine

## 2020-04-02 NOTE — Telephone Encounter (Signed)
Please see message . Thank you .

## 2020-04-02 NOTE — Telephone Encounter (Signed)
Okay. There is wide range of Bps. Keep checking once or twice weekly and bring list for next ov.

## 2020-04-05 NOTE — Telephone Encounter (Signed)
BP appears elevated, would recommend switching from metoprolol 50 mg BID to carvedilol 12.5 mg BID

## 2020-04-09 MED ORDER — CARVEDILOL 12.5 MG PO TABS: 12.5000 mg | ORAL_TABLET | Freq: Two times a day (BID) | ORAL | 3 refills | Status: DC

## 2020-04-27 ENCOUNTER — Encounter: Payer: Self-pay | Admitting: Family Medicine

## 2020-04-27 NOTE — Telephone Encounter (Signed)
Please see message and advise.  Thank you. ° °

## 2020-06-14 ENCOUNTER — Encounter: Payer: Self-pay | Admitting: Family Medicine

## 2020-07-02 ENCOUNTER — Ambulatory Visit (HOSPITAL_COMMUNITY)
Admission: EM | Admit: 2020-07-02 | Discharge: 2020-07-02 | Disposition: A | Discharge status: Home / Self Care | Payer: Medicare Other

## 2020-07-02 ENCOUNTER — Other Ambulatory Visit: Payer: Self-pay

## 2020-07-03 ENCOUNTER — Ambulatory Visit: Payer: Medicare Other

## 2020-07-05 ENCOUNTER — Encounter: Payer: Self-pay | Admitting: Family Medicine

## 2020-07-09 NOTE — Progress Notes (Signed)
Subjective:   Jonathon Johnson is a 75 y.o. male who presents for Medicare Annual/Subsequent preventive examination.  Review of Systems     Cardiac Risk Factors include: advanced age (>29men, >69 women);male gender;obesity (BMI >30kg/m2);dyslipidemia;hypertension     Objective:    Today's Vitals   07/10/20 0843 07/10/20 0849  BP: 122/84   Pulse: 77   Resp: 16   Temp: (!) 97.4 F (36.3 C)   TempSrc: Temporal   SpO2: 98%   Weight: 192 lb 6.4 oz (87.3 kg)   Height: 5\' 7"  (1.702 m)   PainSc:  0-No pain   Body mass index is 30.13 kg/m.  Advanced Directives 07/10/2020 06/22/2019  Does Patient Have a Medical Advance Directive? No No  Does patient want to make changes to medical advance directive? - No - Patient declined  Would patient like information on creating a medical advance directive? No - Patient declined -    Current Medications (verified) Outpatient Encounter Medications as of 07/10/2020  Medication Sig   amitriptyline (ELAVIL) 25 MG tablet TAKE 1 TABLET AT BEDTIME   carvedilol (COREG) 12.5 MG tablet Take 1 tablet (12.5 mg total) by mouth 2 (two) times daily.   finasteride (PROSCAR) 5 MG tablet TAKE 1 TABLET DAILY   fluticasone (FLONASE) 50 MCG/ACT nasal spray USE 2 SPRAYS IN EACH NOSTRIL DAILY   Fluticasone-Salmeterol (ADVAIR) 250-50 MCG/DOSE AEPB Inhale 1 puff into the lungs daily.   ibuprofen (ADVIL) 600 MG tablet Take 600 mg by mouth daily.   losartan (COZAAR) 100 MG tablet Take 1 tablet (100 mg total) by mouth daily.   Multiple Vitamin (MULTIVITAMIN) tablet Take 1 tablet by mouth daily.   omeprazole (PRILOSEC) 40 MG capsule TAKE 1 CAPSULE DAILY   rosuvastatin (CRESTOR) 10 MG tablet Take 10 mg by mouth daily.   tamsulosin (FLOMAX) 0.4 MG CAPS capsule TAKE 2 CAPSULES DAILY AFTER BREAKFAST   diclofenac Sodium (VOLTAREN) 1 % GEL Apply a small amount to painful joints on hand three times daily as needed. (Patient not taking: No sig reported)   [DISCONTINUED]  rosuvastatin (CRESTOR) 10 MG tablet Take 1 tablet (10 mg total) by mouth daily. (Patient not taking: Reported on 03/16/2020)   No facility-administered encounter medications on file as of 07/10/2020.    Allergies (verified) Amoxicillin and Morphine and related   History: Past Medical History:  Diagnosis Date   Asthma    Chicken pox    GERD (gastroesophageal reflux disease)    Hyperlipidemia    Hypertension    Past Surgical History:  Procedure Laterality Date   CHOLECYSTECTOMY     Family History  Problem Relation Age of Onset   Alcohol abuse Mother    Depression Mother    Diabetes Mother    Alcohol abuse Father    COPD Father    Cancer Sister    Hypertension Brother    Social History   Socioeconomic History   Marital status: Married    Spouse name: Not on file   Number of children: Not on file   Years of education: Not on file   Highest education level: Not on file  Occupational History   Not on file  Tobacco Use   Smoking status: Former Smoker    Quit date: 07/19/2010    Years since quitting: 9.9   Smokeless tobacco: Never Used  Substance and Sexual Activity   Alcohol use: Yes    Alcohol/week: 14.0 standard drinks    Types: 14 Glasses of wine per week  Comment: 3-4 glasses of wine daily   Drug use: Never   Sexual activity: Yes    Partners: Female  Other Topics Concern   Not on file  Social History Narrative   Not on file   Social Determinants of Health   Financial Resource Strain: Low Risk    Difficulty of Paying Living Expenses: Not hard at all  Food Insecurity: No Food Insecurity   Worried About Programme researcher, broadcasting/film/video in the Last Year: Never true   Ran Out of Food in the Last Year: Never true  Transportation Needs: No Transportation Needs   Lack of Transportation (Medical): No   Lack of Transportation (Non-Medical): No  Physical Activity: Sufficiently Active   Days of Exercise per Week: 3 days   Minutes of Exercise  per Session: 60 min  Stress: No Stress Concern Present   Feeling of Stress : Not at all  Social Connections: Moderately Integrated   Frequency of Communication with Friends and Family: More than three times a week   Frequency of Social Gatherings with Friends and Family: More than three times a week   Attends Religious Services: 1 to 4 times per year   Active Member of [employer] or Organizations: No   Attends Engineer, structural: Never   Marital Status: Married    Tobacco Counseling Counseling given: Not Answered   Clinical Intake:  Pre-visit preparation completed: Yes  Pain : 0-10 Pain Score: 0-No pain Pain Type: Chronic pain Pain Location: Leg (back of both legs) Pain Onset: More than a month ago Pain Frequency: Intermittent (he only experiences the pain with sneezing or coughing.)     Nutritional Status: BMI > 30  Obese Nutritional Risks: None Diabetes: No  How often do you need to have someone help you when you read instructions, pamphlets, or other written materials from your doctor or pharmacy?: 1 - Never  Diabetic?No  Interpreter Needed?: No  Information entered by :: Thomasenia Sales LPN   Activities of Daily Living In your present state of health, do you have any difficulty performing the following activities: 07/10/2020  Hearing? N  Vision? N  Difficulty concentrating or making decisions? Y  Comment some short term  Walking or climbing stairs? N  Dressing or bathing? N  Doing errands, shopping? N  Preparing Food and eating ? N  Using the Toilet? N  In the past six months, have you accidently leaked urine? N  Do you have problems with loss of bowel control? N  Managing your Medications? N  Managing your Finances? N  Housekeeping or managing your Housekeeping? N  Some recent data might be hidden    Patient Care Team: Mliss Sax, MD as PCP - General (Family Medicine)  Indicate any recent Medical Services you may have received  from other than [facility] providers in the past year (date may be approximate).     Assessment:   This is a routine wellness examination for Jonathon Johnson.  Hearing/Vision screen  Hearing Screening   125Hz  250Hz  500Hz  1000Hz  2000Hz  3000Hz  4000Hz  6000Hz  8000Hz   Right ear:           Left ear:           Comments: Mild hearing loss  Vision Screening Comments: Wears glasses Last eye exam-2021-Dr.Glenn  Dietary issues and exercise activities discussed: Current Exercise Habits: Home exercise routine, Type of exercise: strength training/weights;stretching;walking, Time (Minutes): 60, Frequency (Times/Week): 3, Weekly Exercise (Minutes/Week): 180, Intensity: Mild, Exercise limited by: None identified  Goals  DIET - INCREASE WATER INTAKE     Increase physical activity     Patient Stated     Eat smaller portions      Depression Screen PHQ 2/9 Scores 07/10/2020 06/28/2019 06/22/2019  PHQ - 2 Score 0 0 0    Fall Risk Fall Risk  07/10/2020 11/29/2019 06/28/2019 06/22/2019  Falls in the past year? 0 0 0 0  Number falls in past yr: 0 - - 0  Injury with Fall? 0 - - 0  Follow up Falls prevention discussed - - Education provided;Falls prevention discussed    FALL RISK PREVENTION PERTAINING TO THE HOME:  Any stairs in or around the home? Yes  If so, are there any without handrails? No  Home free of loose throw rugs in walkways, pet beds, electrical cords, etc? Yes  Adequate lighting in your home to reduce risk of falls? Yes   ASSISTIVE DEVICES UTILIZED TO PREVENT FALLS:  Life alert? No  Use of a cane, walker or w/c? No  Grab bars in the bathroom? Yes  Shower chair or bench in shower? No  Elevated toilet seat or a handicapped toilet? No   TIMED UP AND GO:  Was the test performed? Yes .  Length of time to ambulate 10 feet: 10 sec.   Gait steady and fast without use of assistive device  Cognitive Function:Normal cognitive status assessed by direct observation by this Nurse Health Advisor. No  abnormalities found.       6CIT Screen 07/10/2020  What Year? 0 points  What month? 0 points  What time? 0 points  Count back from 20 0 points  Months in reverse 0 points  Repeat phrase 0 points  Total Score 0    Immunizations Immunization History  Administered Date(s) Administered   Fluad Quad(high Dose 65+) 02/09/2019   Influenza-Unspecified 03/11/2018, 02/15/2020   PFIZER(Purple Top)SARS-COV-2 Vaccination 07/24/2019, 08/16/2019, 03/12/2020   Pneumococcal Conjugate-13 06/28/2019   Pneumococcal Polysaccharide-23 05/04/2012   Tdap 03/27/2009, 06/28/2019   Zoster 07/16/2012    TDAP status: Up to date  Flu Vaccine status: Up to date  Pneumococcal vaccine status: Up to date  Covid-19 vaccine status: Completed vaccines  Qualifies for Shingles Vaccine? Yes   Zostavax completed Yes   Shingrix Completed?: No.    Education has been provided regarding the importance of this vaccine. Patient has been advised to call insurance company to determine out of pocket expense if they have not yet received this vaccine. Advised may also receive vaccine at local pharmacy or Health Dept. Verbalized acceptance and understanding.  Screening Tests Health Maintenance  Topic Date Due   COLONOSCOPY (Pts 45-59yrs Insurance coverage will need to be confirmed)  12/01/2022   TETANUS/TDAP  06/27/2029   INFLUENZA VACCINE  Completed   COVID-19 Vaccine  Completed   Hepatitis C Screening  Completed   PNA vac Low Risk Adult  Completed    Health Maintenance  There are no preventive care reminders to display for this patient.  Colorectal cancer screening: Type of screening: Colonoscopy. Completed 11/30/2012. Repeat every 10 years  Lung Cancer Screening: (Low Dose CT Chest recommended if Age 57-80 years, 30 pack-year currently smoking OR have quit w/in 15years.) does not qualify.     Additional Screening:  Hepatitis C Screening:  Completed 06/28/2019  Vision Screening: Recommended  annual ophthalmology exams for early detection of glaucoma and other disorders of the eye. Is the patient up to date with their annual eye exam?  Yes  Who is the  provider or what is the name of the office in which the patient attends annual eye exams? Dr. Sherrine Maples   Dental Screening: Recommended annual dental exams for proper oral hygiene  Community Resource Referral / Chronic Care Management: CRR required this visit?  No   CCM required this visit?  No      Plan:     I have personally reviewed and noted the following in the patients chart:    Medical and social history  Use of alcohol, tobacco or illicit drugs   Current medications and supplements  Functional ability and status  Nutritional status  Physical activity  Advanced directives  List of other physicians  Hospitalizations, surgeries, and ER visits in previous 12 months  Vitals  Screenings to include cognitive, depression, and falls  Referrals and appointments  In addition, I have reviewed and discussed with patient certain preventive protocols, quality metrics, and best practice recommendations. A written personalized care plan for preventive services as well as general preventive health recommendations were provided to patient.     Roanna Raider, LPN   09/10/100  Nurse Health Advisor  Nurse Notes: None

## 2020-07-10 ENCOUNTER — Ambulatory Visit (INDEPENDENT_AMBULATORY_CARE_PROVIDER_SITE_OTHER): Payer: Medicare Other

## 2020-07-10 ENCOUNTER — Other Ambulatory Visit: Payer: Self-pay

## 2020-07-10 VITALS — BP 122/84 | HR 77 | Temp 97.4°F | Resp 16 | Ht 67.0 in | Wt 192.4 lb

## 2020-07-10 DIAGNOSIS — Z0000 Encounter for general adult medical examination without abnormal findings: Secondary | ICD-10-CM | POA: Diagnosis not present

## 2020-07-10 DIAGNOSIS — E785 Hyperlipidemia, unspecified: Secondary | ICD-10-CM

## 2020-07-10 HISTORY — DX: Hyperlipidemia, unspecified: E78.5

## 2020-07-10 NOTE — Patient Instructions (Signed)
[name] , Thank you for taking time to come for your Medicare Wellness Visit. I appreciate your ongoing commitment to your health goals. Please review the following plan we discussed and let me know if I can assist you in the future.   Screening recommendations/referrals: Colonoscopy: Completed 11/30/2012-Due 12/01/2022 Recommended yearly ophthalmology/optometry visit for glaucoma screening and checkup Recommended yearly dental visit for hygiene and checkup  Vaccinations: Influenza vaccine: Up to date Pneumococcal vaccine: Completed vaccines Tdap vaccine: Up to date-Due-06/27/2029 Shingles vaccine: Discuss with pharmacy Covid-19: Completed vaccines  Advanced directives: Please bring a copy for your chart  Conditions/risks identified: See problem list  Next appointment: Follow up in one year for your annual wellness visit. 07/16/2021 @ 9:00  Preventive Care 65 Years and Older, Male Preventive care refers to lifestyle choices and visits with your health care provider that can promote health and wellness. What does preventive care include?  A yearly physical exam. This is also called an annual well check.  Dental exams once or twice a year.  Routine eye exams. Ask your health care provider how often you should have your eyes checked.  Personal lifestyle choices, including:  Daily care of your teeth and gums.  Regular physical activity.  Eating a healthy diet.  Avoiding tobacco and drug use.  Limiting alcohol use.  Practicing safe sex.  Taking low doses of aspirin every day.  Taking vitamin and mineral supplements as recommended by your health care provider. What happens during an annual well check? The services and screenings done by your health care provider during your annual well check will depend on your age, overall health, lifestyle risk factors, and family history of disease. Counseling  Your health care provider may ask you questions about your:  Alcohol  use.  Tobacco use.  Drug use.  Emotional well-being.  Home and relationship well-being.  Sexual activity.  Eating habits.  History of falls.  Memory and ability to understand (cognition).  Work and work Astronomer. Screening  You may have the following tests or measurements:  Height, weight, and BMI.  Blood pressure.  Lipid and cholesterol levels. These may be checked every 5 years, or more frequently if you are over 64 years old.  Skin check.  Lung cancer screening. You may have this screening every year starting at age 17 if you have a 30-pack-year history of smoking and currently smoke or have quit within the past 15 years.  Fecal occult blood test (FOBT) of the stool. You may have this test every year starting at age 59.  Flexible sigmoidoscopy or colonoscopy. You may have a sigmoidoscopy every 5 years or a colonoscopy every 10 years starting at age 88.  Prostate cancer screening. Recommendations will vary depending on your family history and other risks.  Hepatitis C blood test.  Hepatitis B blood test.  Sexually transmitted disease (STD) testing.  Diabetes screening. This is done by checking your blood sugar (glucose) after you have not eaten for a while (fasting). You may have this done every 1-3 years.  Abdominal aortic aneurysm (AAA) screening. You may need this if you are a current or former smoker.  Osteoporosis. You may be screened starting at age 4 if you are at high risk. Talk with your health care provider about your test results, treatment options, and if necessary, the need for more tests. Vaccines  Your health care provider may recommend certain vaccines, such as:  Influenza vaccine. This is recommended every year.  Tetanus, diphtheria, and acellular pertussis (  Tdap, Td) vaccine. You may need a Td booster every 10 years.  Zoster vaccine. You may need this after age 33.  Pneumococcal 13-valent conjugate (PCV13) vaccine. One dose is  recommended after age 31.  Pneumococcal polysaccharide (PPSV23) vaccine. One dose is recommended after age 63. Talk to your health care provider about which screenings and vaccines you need and how often you need them. This information is not intended to replace advice given to you by your health care provider. Make sure you discuss any questions you have with your health care provider. Document Released: 06/22/2015 Document Revised: 02/13/2016 Document Reviewed: 03/27/2015 Elsevier Interactive Patient Education  2017 [place] Prevention in the Home Falls can cause injuries. They can happen to people of all ages. There are many things you can do to make your home safe and to help prevent falls. What can I do on the outside of my home?  Regularly fix the edges of walkways and driveways and fix any cracks.  Remove anything that might make you trip as you walk through a door, such as a raised step or threshold.  Trim any bushes or trees on the path to your home.  Use bright outdoor lighting.  Clear any walking paths of anything that might make someone trip, such as rocks or tools.  Regularly check to see if handrails are loose or broken. Make sure that both sides of any steps have handrails.  Any raised decks and porches should have guardrails on the edges.  Have any leaves, snow, or ice cleared regularly.  Use sand or salt on walking paths during winter.  Clean up any spills in your garage right away. This includes oil or grease spills. What can I do in the bathroom?  Use night lights.  Install grab bars by the toilet and in the tub and shower. Do not use towel bars as grab bars.  Use non-skid mats or decals in the tub or shower.  If you need to sit down in the shower, use a plastic, non-slip stool.  Keep the floor dry. Clean up any water that spills on the floor as soon as it happens.  Remove soap buildup in the tub or shower regularly.  Attach bath mats  securely with double-sided non-slip rug tape.  Do not have throw rugs and other things on the floor that can make you trip. What can I do in the bedroom?  Use night lights.  Make sure that you have a light by your bed that is easy to reach.  Do not use any sheets or blankets that are too big for your bed. They should not hang down onto the floor.  Have a firm chair that has side arms. You can use this for support while you get dressed.  Do not have throw rugs and other things on the floor that can make you trip. What can I do in the kitchen?  Clean up any spills right away.  Avoid walking on wet floors.  Keep items that you use a lot in easy-to-reach places.  If you need to reach something above you, use a strong step stool that has a grab bar.  Keep electrical cords out of the way.  Do not use floor polish or wax that makes floors slippery. If you must use wax, use non-skid floor wax.  Do not have throw rugs and other things on the floor that can make you trip. What can I do with my stairs?  Do  not leave any items on the stairs.  Make sure that there are handrails on both sides of the stairs and use them. Fix handrails that are broken or loose. Make sure that handrails are as long as the stairways.  Check any carpeting to make sure that it is firmly attached to the stairs. Fix any carpet that is loose or worn.  Avoid having throw rugs at the top or bottom of the stairs. If you do have throw rugs, attach them to the floor with carpet tape.  Make sure that you have a light switch at the top of the stairs and the bottom of the stairs. If you do not have them, ask someone to add them for you. What else can I do to help prevent falls?  Wear shoes that:  Do not have high heels.  Have rubber bottoms.  Are comfortable and fit you well.  Are closed at the toe. Do not wear sandals.  If you use a stepladder:  Make sure that it is fully opened. Do not climb a closed  stepladder.  Make sure that both sides of the stepladder are locked into place.  Ask someone to hold it for you, if possible.  Clearly mark and make sure that you can see:  Any grab bars or handrails.  First and last steps.  Where the edge of each step is.  Use tools that help you move around (mobility aids) if they are needed. These include:  Canes.  Walkers.  Scooters.  Crutches.  Turn on the lights when you go into a dark area. Replace any light bulbs as soon as they burn out.  Set up your furniture so you have a clear path. Avoid moving your furniture around.  If any of your floors are uneven, fix them.  If there are any pets around you, be aware of where they are.  Review your medicines with your doctor. Some medicines can make you feel dizzy. This can increase your chance of falling. Ask your doctor what other things that you can do to help prevent falls. This information is not intended to replace advice given to you by your health care provider. Make sure you discuss any questions you have with your health care provider. Document Released: 03/22/2009 Document Revised: 11/01/2015 Document Reviewed: 06/30/2014 Elsevier Interactive Patient Education  2017 [employer].

## 2020-07-12 ENCOUNTER — Other Ambulatory Visit: Payer: Self-pay | Admitting: Family Medicine

## 2020-07-12 DIAGNOSIS — N401 Enlarged prostate with lower urinary tract symptoms: Secondary | ICD-10-CM

## 2020-07-12 DIAGNOSIS — R3911 Hesitancy of micturition: Secondary | ICD-10-CM

## 2020-07-25 DIAGNOSIS — H52223 Regular astigmatism, bilateral: Secondary | ICD-10-CM | POA: Diagnosis not present

## 2020-07-25 DIAGNOSIS — H35033 Hypertensive retinopathy, bilateral: Secondary | ICD-10-CM | POA: Diagnosis not present

## 2020-07-25 DIAGNOSIS — H2513 Age-related nuclear cataract, bilateral: Secondary | ICD-10-CM | POA: Diagnosis not present

## 2020-07-25 DIAGNOSIS — H18593 Other hereditary corneal dystrophies, bilateral: Secondary | ICD-10-CM | POA: Diagnosis not present

## 2020-07-25 DIAGNOSIS — H5213 Myopia, bilateral: Secondary | ICD-10-CM | POA: Diagnosis not present

## 2020-07-25 DIAGNOSIS — H524 Presbyopia: Secondary | ICD-10-CM | POA: Diagnosis not present

## 2020-08-28 ENCOUNTER — Encounter: Payer: Self-pay | Admitting: Family Medicine

## 2020-08-28 ENCOUNTER — Ambulatory Visit (INDEPENDENT_AMBULATORY_CARE_PROVIDER_SITE_OTHER): Payer: Medicare Other

## 2020-08-28 ENCOUNTER — Other Ambulatory Visit: Payer: Self-pay

## 2020-08-28 ENCOUNTER — Ambulatory Visit (INDEPENDENT_AMBULATORY_CARE_PROVIDER_SITE_OTHER): Payer: Medicare Other | Admitting: Family Medicine

## 2020-08-28 VITALS — BP 156/92 | HR 81 | Temp 97.9°F | Ht 67.0 in | Wt 188.0 lb

## 2020-08-28 DIAGNOSIS — G8929 Other chronic pain: Secondary | ICD-10-CM | POA: Insufficient documentation

## 2020-08-28 DIAGNOSIS — M5441 Lumbago with sciatica, right side: Secondary | ICD-10-CM

## 2020-08-28 DIAGNOSIS — M5442 Lumbago with sciatica, left side: Secondary | ICD-10-CM

## 2020-08-28 DIAGNOSIS — I10 Essential (primary) hypertension: Secondary | ICD-10-CM | POA: Diagnosis not present

## 2020-08-28 DIAGNOSIS — M545 Low back pain, unspecified: Secondary | ICD-10-CM | POA: Diagnosis not present

## 2020-08-28 MED ORDER — GABAPENTIN 400 MG PO CAPS: 400.0000 mg | ORAL_CAPSULE | Freq: Every day | ORAL | 2 refills | Status: DC

## 2020-08-28 NOTE — Patient Instructions (Signed)
[email or web address]">  Chronic Back Pain When back pain lasts longer than 3 months, it is called chronic back pain. The cause of your back pain may not be known. Some common causes include:  Wear and tear (degenerative disease) of the bones, ligaments, or disks in your back.  Inflammation and stiffness in your back (arthritis). People who have chronic back pain often go through certain periods in which the pain is more intense (flare-ups). Many people can learn to manage the pain with home care. Follow these instructions at home: Pay attention to any changes in your symptoms. Take these actions to help with your pain: Managing pain and stiffness  If directed, apply ice to the painful area. Your health care provider may recommend applying ice during the first 24-48 hours after a flare-up begins. To do this: ? Put ice in a plastic bag. ? Place a towel between your skin and the bag. ? Leave the ice on for 20 minutes, 2-3 times per day.  If directed, apply heat to the affected area as often as told by your health care provider. Use the heat source that your health care provider recommends, such as a moist heat pack or a heating pad. ? Place a towel between your skin and the heat source. ? Leave the heat on for 20-30 minutes. ? Remove the heat if your skin turns bright red. This is especially important if you are unable to feel pain, heat, or cold. You may have a greater risk of getting burned.  Try soaking in a warm tub.      Activity  Avoid bending and other activities that make the problem worse.  Maintain a proper position when standing or sitting: ? When standing, keep your upper back and neck straight, with your shoulders pulled back. Avoid slouching. ? When sitting, keep your back straight and relax your shoulders. Do not round your shoulders or pull them backward.  Do not sit or stand in one place for long periods of time.  Take brief periods of rest  throughout the day. This will reduce your pain. Resting in a lying or standing position is usually better than sitting to rest.  When you are resting for longer periods, mix in some mild activity or stretching between periods of rest. This will help to prevent stiffness and pain.  Get regular exercise. Ask your health care provider what activities are safe for you.  Do not lift anything that is heavier than 10 lb (4.5 kg), or the limit that you are told, until your health care provider says that it is safe. Always use proper lifting technique, which includes: ? Bending your knees. ? Keeping the load close to your body. ? Avoiding twisting.  Sleep on a firm mattress in a comfortable position. Try lying on your side with your knees slightly bent. If you lie on your back, put a pillow under your knees.   Medicines  Treatment may include medicines for pain and inflammation taken by mouth or applied to the skin, prescription pain medicine, or muscle relaxants. Take over-the-counter and prescription medicines only as told by your health care provider.  Ask your health care provider if the medicine prescribed to you: ? Requires you to avoid driving or using machinery. ? Can cause constipation. You may need to take these actions to prevent or treat constipation:  Drink enough fluid to keep your urine pale yellow.  Take over-the-counter or prescription medicines.  Eat foods that are high in fiber, such as   beans, whole grains, and fresh fruits and vegetables.  Limit foods that are high in fat and processed sugars, such as fried or sweet foods. General instructions  Do not use any products that contain nicotine or tobacco, such as cigarettes, e-cigarettes, and chewing tobacco. If you need help quitting, ask your health care provider.  Keep all follow-up visits as told by your health care provider. This is important. Contact a health care provider if:  You have pain that is not relieved with  rest or medicine.  Your pain gets worse, or you have new pain.  You have a high fever.  You have rapid weight loss.  You have trouble doing your normal activities. Get help right away if:  You have weakness or numbness in one or both of your legs or feet.  You have trouble controlling your bladder or your bowels.  You have severe back pain and have any of the following: ? Nausea or vomiting. ? Pain in your abdomen. ? Shortness of breath or you faint. Summary  Chronic back pain is back pain that lasts longer than 3 months.  When a flare-up begins, apply ice to the painful area for the first 24-48 hours.  Apply a moist heat pad or use a heating pad on the painful area as directed by your health care provider.  When you are resting for longer periods, mix in some mild activity or stretching between periods of rest. This will help to prevent stiffness and pain. This information is not intended to replace advice given to you by your health care provider. Make sure you discuss any questions you have with your health care provider. Document Revised: 07/06/2019 Document Reviewed: 07/06/2019 Elsevier Patient Education  2021 [employer].  Sciatica  Sciatica is pain, numbness, weakness, or tingling along the path of the sciatic nerve. The sciatic nerve starts in the lower back and runs down the back of each leg. The nerve controls the muscles in the lower leg and in the back of the knee. It also provides feeling (sensation) to the back of the thigh, the lower leg, and the sole of the foot. Sciatica is a symptom of another medical condition that pinches or puts pressure on the sciatic nerve. Sciatica most often only affects one side of the body. Sciatica usually goes away on its own or with treatment. In some cases, sciatica may come back (recur). What are the causes? This condition is caused by pressure on the sciatic nerve or pinching of the nerve. This may be the result of:  A disk in  between the bones of the spine bulging out too far (herniated disk).  Age-related changes in the spinal disks.  A pain disorder that affects a muscle in the buttock.  Extra bone growth near the sciatic nerve.  A break (fracture) of the pelvis.  Pregnancy.  Tumor. This is rare. What increases the risk? The following factors may make you more likely to develop this condition:  Playing sports that place pressure or stress on the spine.  Having poor strength and flexibility.  A history of back injury or surgery.  Sitting for long periods of time.  Doing activities that involve repetitive bending or lifting.  Obesity. What are the signs or symptoms? Symptoms can vary from mild to very severe, and they may include:  Any of these problems in the lower back, leg, hip, or buttock: ? Mild tingling, numbness, or dull aches. ? Burning sensations. ? Sharp pains.  Numbness in  the back of the calf or the sole of the foot.  Leg weakness.  Severe back pain that makes movement difficult. Symptoms may get worse when you cough, sneeze, or laugh, or when you sit or stand for long periods of time. How is this diagnosed? This condition may be diagnosed based on:  Your symptoms and medical history.  A physical exam.  Blood tests.  Imaging tests, such as: ? X-rays. ? MRI. ? CT scan. How is this treated? In many cases, this condition improves on its own without treatment. However, treatment may include:  Reducing or modifying physical activity.  Exercising and stretching.  Icing and applying heat to the affected area.  Medicines that help to: ? Relieve pain and swelling. ? Relax your muscles.  Injections of medicines that help to relieve pain, irritation, and inflammation around the sciatic nerve (steroids).  Surgery. Follow these instructions at home: Medicines  Take over-the-counter and prescription medicines only as told by your health care provider.  Ask your  health care provider if the medicine prescribed to you: ? Requires you to avoid driving or using heavy machinery. ? Can cause constipation. You may need to take these actions to prevent or treat constipation:  Drink enough fluid to keep your urine pale yellow.  Take over-the-counter or prescription medicines.  Eat foods that are high in fiber, such as beans, whole grains, and fresh fruits and vegetables.  Limit foods that are high in fat and processed sugars, such as fried or sweet foods. Managing pain  If directed, put ice on the affected area. ? Put ice in a plastic bag. ? Place a towel between your skin and the bag. ? Leave the ice on for 20 minutes, 2-3 times a day.  If directed, apply heat to the affected area. Use the heat source that your health care provider recommends, such as a moist heat pack or a heating pad. ? Place a towel between your skin and the heat source. ? Leave the heat on for 20-30 minutes. ? Remove the heat if your skin turns bright red. This is especially important if you are unable to feel pain, heat, or cold. You may have a greater risk of getting burned.      Activity  Return to your normal activities as told by your health care provider. Ask your health care provider what activities are safe for you.  Avoid activities that make your symptoms worse.  Take brief periods of rest throughout the day. ? When you rest for longer periods, mix in some mild activity or stretching between periods of rest. This will help to prevent stiffness and pain. ? Avoid sitting for long periods of time without moving. Get up and move around at least one time each hour.  Exercise and stretch regularly, as told by your health care provider.  Do not lift anything that is heavier than 10 lb (4.5 kg) while you have symptoms of sciatica. When you do not have symptoms, you should still avoid heavy lifting, especially repetitive heavy lifting.  When you lift objects, always use  proper lifting technique, which includes: ? Bending your knees. ? Keeping the load close to your body. ? Avoiding twisting.   General instructions  Maintain a healthy weight. Excess weight puts extra stress on your back.  Wear supportive, comfortable shoes. Avoid wearing high heels.  Avoid sleeping on a mattress that is too soft or too hard. A mattress that is firm enough to support your back when  you sleep may help to reduce your pain.  Keep all follow-up visits as told by your health care provider. This is important. Contact a health care provider if:  You have pain that: ? Wakes you up when you are sleeping. ? Gets worse when you lie down. ? Is worse than you have experienced in the past. ? Lasts longer than 4 weeks.  You have an unexplained weight loss. Get help right away if:  You are not able to control when you urinate or have bowel movements (incontinence).  You have: ? Weakness in your lower back, pelvis, buttocks, or legs that gets worse. ? Redness or swelling of your back. ? A burning sensation when you urinate. Summary  Sciatica is pain, numbness, weakness, or tingling along the path of the sciatic nerve.  This condition is caused by pressure on the sciatic nerve or pinching of the nerve.  Sciatica can cause pain, numbness, or tingling in the lower back, legs, hips, and buttocks.  Treatment often includes rest, exercise, medicines, and applying ice or heat. This information is not intended to replace advice given to you by your health care provider. Make sure you discuss any questions you have with your health care provider. Document Revised: 06/14/2018 Document Reviewed: 06/14/2018 Elsevier Patient Education  2021 [employer] Inc. Gabapentin capsules or tablets What is this medicine? GABAPENTIN (GA ba pen tin) is used to control seizures in certain types of epilepsy. It is also used to treat certain types of nerve pain. This medicine may be used for other  purposes; ask your health care provider or pharmacist if you have questions. COMMON BRAND NAME(S): Active-PAC with Gabapentin, Ascencion Dike, Gralise, Neurontin What should I tell my health care provider before I take this medicine? They need to know if you have any of these conditions:  history of drug abuse or alcohol abuse problem  kidney disease  lung or breathing disease  suicidal thoughts, plans, or attempt; a previous suicide attempt by you or a family member  an unusual or allergic reaction to gabapentin, other medicines, foods, dyes, or preservatives  pregnant or trying to get pregnant  breast-feeding How should I use this medicine? Take this medicine by mouth with a glass of water. Follow the directions on the prescription label. You can take it with or without food. If it upsets your stomach, take it with food. Take your medicine at regular intervals. Do not take it more often than directed. Do not stop taking except on your doctor's advice. If you are directed to break the 600 or 800 mg tablets in half as part of your dose, the extra half tablet should be used for the next dose. If you have not used the extra half tablet within 28 days, it should be thrown away. A special MedGuide will be given to you by the pharmacist with each prescription and refill. Be sure to read this information carefully each time. Talk to your pediatrician regarding the use of this medicine in children. While this drug may be prescribed for children as young as 3 years for selected conditions, precautions do apply. Overdosage: If you think you have taken too much of this medicine contact a poison control center or emergency room at once. NOTE: This medicine is only for you. Do not share this medicine with others. What if I miss a dose? If you miss a dose, take it as soon as you can. If it is almost time for your next dose, take only that dose.  Do not take double or extra doses. What may interact with this  medicine? This medicine may interact with the following medications:  alcohol  antihistamines for allergy, cough, and cold  certain medicines for anxiety or sleep  certain medicines for depression like amitriptyline, fluoxetine, sertraline  certain medicines for seizures like phenobarbital, primidone  certain medicines for stomach problems  general anesthetics like halothane, isoflurane, methoxyflurane, propofol  local anesthetics like lidocaine, pramoxine, tetracaine  medicines that relax muscles for surgery  narcotic medicines for pain  phenothiazines like chlorpromazine, mesoridazine, prochlorperazine, thioridazine This list may not describe all possible interactions. Give your health care provider a list of all the medicines, herbs, non-prescription drugs, or dietary supplements you use. Also tell them if you smoke, drink alcohol, or use illegal drugs. Some items may interact with your medicine. What should I watch for while using this medicine? Visit your doctor or health care provider for regular checks on your progress. You may want to keep a record at home of how you feel your condition is responding to treatment. You may want to share this information with your doctor or health care provider at each visit. You should contact your doctor or health care provider if your seizures get worse or if you have any new types of seizures. Do not stop taking this medicine or any of your seizure medicines unless instructed by your doctor or health care provider. Stopping your medicine suddenly can increase your seizures or their severity. This medicine may cause serious skin reactions. They can happen weeks to months after starting the medicine. Contact your health care provider right away if you notice fevers or flu-like symptoms with a rash. The rash may be red or purple and then turn into blisters or peeling of the skin. Or, you might notice a red rash with swelling of the face, lips or  lymph nodes in your neck or under your arms. Wear a medical identification bracelet or chain if you are taking this medicine for seizures, and carry a card that lists all your medications. You may get drowsy, dizzy, or have blurred vision. Do not drive, use machinery, or do anything that needs mental alertness until you know how this medicine affects you. To reduce dizzy or fainting spells, do not sit or stand up quickly, especially if you are an older patient. Alcohol can increase drowsiness and dizziness. Avoid alcoholic drinks. Your mouth may get dry. Chewing sugarless gum or sucking hard candy, and drinking plenty of water will help. The use of this medicine may increase the chance of suicidal thoughts or actions. Pay special attention to how you are responding while on this medicine. Any worsening of mood, or thoughts of suicide or dying should be reported to your health care provider right away. Women who become pregnant while using this medicine may enroll in the [place] [employer] Antiepileptic Drug Pregnancy Registry by calling [id number]. This registry collects information about the safety of antiepileptic drug use during pregnancy. What side effects may I notice from receiving this medicine? Side effects that you should report to your doctor or health care professional as soon as possible:  allergic reactions like skin rash, itching or hives, swelling of the face, lips, or tongue  breathing problems  rash, fever, and swollen lymph nodes  redness, blistering, peeling or loosening of the skin, including inside the mouth  suicidal thoughts, mood changes Side effects that usually do not require medical attention (report to your doctor or health care professional if they  continue or are bothersome):  dizziness  drowsiness  headache  nausea, vomiting  swelling of ankles, feet, hands  tiredness This list may not describe all possible side effects. Call your doctor for medical  advice about side effects. You may report side effects to FDA at 1-800-FDA-1088. Where should I keep my medicine? Keep out of reach of children. This medicine may cause accidental overdose and death if it taken by other adults, children, or pets. Mix any unused medicine with a substance like cat litter or coffee grounds. Then throw the medicine away in a sealed container like a sealed bag or a coffee can with a lid. Do not use the medicine after the expiration date. Store at room temperature between 15 and 30 degrees C (59 and 86 degrees F). NOTE: This sheet is a summary. It may not cover all possible information. If you have questions about this medicine, talk to your doctor, pharmacist, or health care provider.  2021 Elsevier/Gold Standard (2018-08-27 14:16:43)

## 2020-08-28 NOTE — Progress Notes (Signed)
Established Patient Office Visit  Subjective:  Patient ID: Jonathon Johnson, male    DOB: 1945/09/11  Age: 75 y.o. MRN: [id number]  CC:  Chief Complaint  Patient presents with  . Follow-up    Complains of pain in lower back that radiates to back of both legs symptoms x 3-4 months come and go.      HPI Jonathon Johnson presents for evaluation of a 4 to 5 months history of bilateral lower back pain radiating down the backs of both legs to the mid calf area with Valsalva, cough sneeze, certain movements.  These happen normally when he is erect.  He has noted no injury.  History of spinal stenosis.  Has never had an MRI.  Denies paresthesias or weakness.  Denies changes in his bowel or bladder function.  No saddle paresthesias.  No injury.  Blood pressure is elevated today on current therapy.  He continues to moderate his alcohol intake.  He started going to the gym 3 months ago.  He had to discontinue knee extension exercises because of the pain.  Past Medical History:  Diagnosis Date  . Asthma   . Chicken pox   . GERD (gastroesophageal reflux disease)   . Hyperlipidemia   . Hypertension     Past Surgical History:  Procedure Laterality Date  . CHOLECYSTECTOMY      Family History  Problem Relation Age of Onset  . Alcohol abuse Mother   . Depression Mother   . Diabetes Mother   . Alcohol abuse Father   . COPD Father   . Cancer Sister   . Hypertension Brother     Social History   Socioeconomic History  . Marital status: Married    Spouse name: Not on file  . Number of children: Not on file  . Years of education: Not on file  . Highest education level: Not on file  Occupational History  . Not on file  Tobacco Use  . Smoking status: Former Smoker    Quit date: 07/19/2010    Years since quitting: 10.1  . Smokeless tobacco: Never Used  Substance and Sexual Activity  . Alcohol use: Yes    Alcohol/week: 14.0 standard drinks    Types: 14 Glasses of wine per week    Comment:  3-4 glasses of wine daily  . Drug use: Never  . Sexual activity: Yes    Partners: Female  Other Topics Concern  . Not on file  Social History Narrative  . Not on file   Social Determinants of Health   Financial Resource Strain: Low Risk   . Difficulty of Paying Living Expenses: Not hard at all  Food Insecurity: No Food Insecurity  . Worried About Programme researcher, broadcasting/film/video in the Last Year: Never true  . Ran Out of Food in the Last Year: Never true  Transportation Needs: No Transportation Needs  . Lack of Transportation (Medical): No  . Lack of Transportation (Non-Medical): No  Physical Activity: Sufficiently Active  . Days of Exercise per Week: 3 days  . Minutes of Exercise per Session: 60 min  Stress: No Stress Concern Present  . Feeling of Stress : Not at all  Social Connections: Moderately Integrated  . Frequency of Communication with Friends and Family: More than three times a week  . Frequency of Social Gatherings with Friends and Family: More than three times a week  . Attends Religious Services: 1 to 4 times per year  . Active Member of Clubs or  Organizations: No  . Attends Banker Meetings: Never  . Marital Status: Married  Catering manager Violence: Not At Risk  . Fear of Current or Ex-Partner: No  . Emotionally Abused: No  . Physically Abused: No  . Sexually Abused: No    Outpatient Medications Prior to Visit  Medication Sig Dispense Refill  . amitriptyline (ELAVIL) 25 MG tablet TAKE 1 TABLET AT BEDTIME 90 tablet 3  . carvedilol (COREG) 12.5 MG tablet Take 1 tablet (12.5 mg total) by mouth 2 (two) times daily. 180 tablet 3  . finasteride (PROSCAR) 5 MG tablet TAKE 1 TABLET DAILY 90 tablet 3  . fluticasone (FLONASE) 50 MCG/ACT nasal spray USE 2 SPRAYS IN EACH NOSTRIL DAILY 48 g 3  . Fluticasone-Salmeterol (ADVAIR) 250-50 MCG/DOSE AEPB Inhale 1 puff into the lungs daily.    [name] Kitchen ibuprofen (ADVIL) 600 MG tablet Take 600 mg by mouth daily.    [name] Kitchen losartan  (COZAAR) 100 MG tablet Take 1 tablet (100 mg total) by mouth daily. 90 tablet 3  . Multiple Vitamin (MULTIVITAMIN) tablet Take 1 tablet by mouth daily.    [name] Kitchen omeprazole (PRILOSEC) 40 MG capsule TAKE 1 CAPSULE DAILY 90 capsule 3  . rosuvastatin (CRESTOR) 10 MG tablet Take 10 mg by mouth daily.    . tamsulosin (FLOMAX) 0.4 MG CAPS capsule TAKE 2 CAPSULES DAILY AFTER BREAKFAST 180 capsule 3  . diclofenac Sodium (VOLTAREN) 1 % GEL Apply a small amount to painful joints on hand three times daily as needed. (Patient not taking: No sig reported) 100 g 2   No facility-administered medications prior to visit.    Allergies  Allergen Reactions  . Amoxicillin Diarrhea  . Morphine And Related Nausea And Vomiting    ROS Review of Systems  Constitutional: Negative.   Respiratory: Negative.   Cardiovascular: Negative.   Gastrointestinal: Negative.   Genitourinary: Negative.   Musculoskeletal: Positive for back pain. Negative for gait problem and myalgias.  Neurological: Negative for weakness and numbness.  Psychiatric/Behavioral: Negative.       Objective:    Physical Exam Vitals and nursing note reviewed.  Constitutional:      General: He is not in acute distress.    Appearance: Normal appearance. He is not ill-appearing, toxic-appearing or diaphoretic.  HENT:     Right Ear: External ear normal.     Left Ear: External ear normal.  Eyes:     General: No scleral icterus.    Extraocular Movements: Extraocular movements intact.     Conjunctiva/sclera: Conjunctivae normal.     Pupils: Pupils are equal, round, and reactive to light.  Pulmonary:     Effort: Pulmonary effort is normal.  Musculoskeletal:     Lumbar back: No deformity or spasms. Decreased range of motion. Negative right straight leg raise test and negative left straight leg raise test.     Comments: Flexion was to 6 inches.  Extension and rotation are normal.  Skin:    General: Skin is warm and dry.  Neurological:      Mental Status: He is alert and oriented to person, place, and time.     Deep Tendon Reflexes:     Reflex Scores:      Patellar reflexes are 1+ on the right side and 1+ on the left side.      Achilles reflexes are 1+ on the right side and 1+ on the left side.    BP (!) 156/92   Pulse 81   Temp 97.9 F (36.6  C) (Temporal)   Ht 5\' 7"  (1.702 m)   Wt 188 lb (85.3 kg)   SpO2 97%   BMI 29.44 kg/m  Wt Readings from Last 3 Encounters:  08/28/20 188 lb (85.3 kg)  07/10/20 192 lb 6.4 oz (87.3 kg)  03/16/20 191 lb (86.6 kg)     There are no preventive care reminders to display for this patient.  There are no preventive care reminders to display for this patient.  Lab Results  Component Value Date   TSH 1.462 12/08/2019   Lab Results  Component Value Date   WBC 6.6 12/07/2019   HGB 14.4 12/07/2019   HCT 43.2 12/07/2019   MCV 94.3 12/07/2019   PLT 256 12/07/2019   Lab Results  Component Value Date   NA 138 12/07/2019   K 3.6 12/07/2019   CO2 21 (L) 12/07/2019   GLUCOSE 124 (H) 12/07/2019   BUN 12 12/07/2019   CREATININE 0.85 12/07/2019   BILITOT 0.5 06/28/2019   ALKPHOS 60 06/28/2019   AST 29 06/28/2019   ALT 32 06/28/2019   PROT 6.9 06/28/2019   ALBUMIN 4.0 06/28/2019   CALCIUM 9.2 12/07/2019   ANIONGAP 13 12/07/2019   GFR 80.50 11/29/2019   Lab Results  Component Value Date   CHOL 188 11/29/2019   Lab Results  Component Value Date   HDL 65.40 11/29/2019   Lab Results  Component Value Date   LDLCALC 108 (H) 11/29/2019   Lab Results  Component Value Date   TRIG 71.0 11/29/2019   Lab Results  Component Value Date   CHOLHDL 3 11/29/2019   No results found for: HGBA1C    Assessment & Plan:   Problem List Items Addressed This Visit      Cardiovascular and Mediastinum   Essential hypertension     Nervous and Auditory   Chronic bilateral low back pain with bilateral sciatica - Primary   Relevant Medications   gabapentin (NEURONTIN) 400 MG capsule    Other Relevant Orders   DG Lumbar Spine Complete   MR Lumbar Spine Wo Contrast      Meds ordered this encounter  Medications  . gabapentin (NEURONTIN) 400 MG capsule    Sig: Take 1 capsule (400 mg total) by mouth at bedtime.    Dispense:  30 capsule    Refill:  2    Follow-up: Return in about 1 month (around 09/28/2020), or For follow up of chronic health problems..  Patient was given information on chronic back pain and sciatica.  He was also given information on Neurontin.  He is going to go for plain films here today  09/30/2020, MD

## 2020-09-01 ENCOUNTER — Ambulatory Visit (HOSPITAL_BASED_OUTPATIENT_CLINIC_OR_DEPARTMENT_OTHER)
Admission: RE | Admit: 2020-09-01 | Discharge: 2020-09-01 | Disposition: A | Discharge status: Home / Self Care | Payer: Medicare Other | Source: Ambulatory Visit | Attending: Family Medicine | Admitting: Family Medicine

## 2020-09-01 ENCOUNTER — Other Ambulatory Visit: Payer: Self-pay

## 2020-09-01 DIAGNOSIS — M5442 Lumbago with sciatica, left side: Secondary | ICD-10-CM | POA: Diagnosis not present

## 2020-09-01 DIAGNOSIS — M5441 Lumbago with sciatica, right side: Secondary | ICD-10-CM | POA: Diagnosis not present

## 2020-09-01 DIAGNOSIS — G8929 Other chronic pain: Secondary | ICD-10-CM | POA: Diagnosis not present

## 2020-09-01 DIAGNOSIS — M545 Low back pain, unspecified: Secondary | ICD-10-CM | POA: Diagnosis not present

## 2020-09-18 ENCOUNTER — Ambulatory Visit: Payer: Medicare Other | Attending: Internal Medicine

## 2020-09-18 ENCOUNTER — Other Ambulatory Visit (HOSPITAL_BASED_OUTPATIENT_CLINIC_OR_DEPARTMENT_OTHER): Payer: Self-pay

## 2020-09-18 ENCOUNTER — Other Ambulatory Visit: Payer: Self-pay

## 2020-09-18 DIAGNOSIS — Z23 Encounter for immunization: Secondary | ICD-10-CM

## 2020-09-18 NOTE — Progress Notes (Signed)
   Covid-19 Vaccination Clinic  Name:  Jonathon Johnson    MRN: [id number] DOB: 1946/05/16  09/18/2020  Jonathon Johnson was observed post Covid-19 immunization for 15 minutes without incident. He was provided with Vaccine Information Sheet and instruction to access the V-Safe system.   Jonathon Johnson was instructed to call 911 with any severe reactions post vaccine: [name] Kitchen Difficulty breathing  . Swelling of face and throat  . A fast heartbeat  . A bad rash all over body  . Dizziness and weakness   Immunizations Administered    Name Date Dose VIS Date Route   PFIZER Comrnaty(Gray TOP) Covid-19 Vaccine 09/18/2020  9:36 AM 0.3 mL 05/17/2020 Intramuscular   Manufacturer: [employer], [employer]   Lot: [id number]   NDC: [phone]

## 2020-09-21 ENCOUNTER — Other Ambulatory Visit (HOSPITAL_BASED_OUTPATIENT_CLINIC_OR_DEPARTMENT_OTHER): Payer: Self-pay

## 2020-09-21 MED ORDER — COVID-19 MRNA VACCINE (PFIZER) 30 MCG/0.3ML IM SUSP
INTRAMUSCULAR | 0 refills | Status: DC
  Filled 2020-09-21: qty 0.3, 1d supply, fill #0

## 2020-09-27 ENCOUNTER — Other Ambulatory Visit: Payer: Self-pay

## 2020-09-27 ENCOUNTER — Encounter: Payer: Self-pay | Admitting: Family Medicine

## 2020-09-27 ENCOUNTER — Ambulatory Visit (INDEPENDENT_AMBULATORY_CARE_PROVIDER_SITE_OTHER): Payer: Medicare Other | Admitting: Family Medicine

## 2020-09-27 VITALS — BP 124/78 | HR 91 | Temp 97.4°F | Ht 67.0 in | Wt 191.2 lb

## 2020-09-27 DIAGNOSIS — I10 Essential (primary) hypertension: Secondary | ICD-10-CM

## 2020-09-27 DIAGNOSIS — E7800 Pure hypercholesterolemia, unspecified: Secondary | ICD-10-CM

## 2020-09-27 DIAGNOSIS — M5441 Lumbago with sciatica, right side: Secondary | ICD-10-CM | POA: Diagnosis not present

## 2020-09-27 DIAGNOSIS — M5442 Lumbago with sciatica, left side: Secondary | ICD-10-CM | POA: Diagnosis not present

## 2020-09-27 DIAGNOSIS — N401 Enlarged prostate with lower urinary tract symptoms: Secondary | ICD-10-CM

## 2020-09-27 DIAGNOSIS — R3911 Hesitancy of micturition: Secondary | ICD-10-CM | POA: Diagnosis not present

## 2020-09-27 DIAGNOSIS — G8929 Other chronic pain: Secondary | ICD-10-CM

## 2020-09-27 MED ORDER — PREDNISONE 10 MG (48) PO TBPK: ORAL_TABLET | ORAL | 0 refills | Status: DC

## 2020-09-27 MED ORDER — GABAPENTIN 400 MG PO CAPS: 400.0000 mg | ORAL_CAPSULE | Freq: Two times a day (BID) | ORAL | 1 refills | Status: DC

## 2020-09-27 NOTE — Progress Notes (Signed)
Established Patient Office Visit  Subjective:  Patient ID: Jonathon Johnson, male    DOB: 01/25/1946  Age: 75 y.o. MRN: [id number]  CC:  Chief Complaint  Patient presents with  . Follow-up    1 month follow up on BP and back pains. Per patient still having frequent pains.     HPI Jonathon Johnson presents for follow-up of hypertension, elevated cholesterol, BPH.  Blood pressure is well controlled.  LDL slightly elevated on current dose of Crestor.  BPH symptoms are improved with Proscar.  Lower back pain persists with some radiation into the backs of his legs.  Recent MRI showed extensive arthropathy with stenosis and degeneration of L2-L3.  Does not believe the gabapentin is helping much right now.  Continues to moderate alcohol.  Past Medical History:  Diagnosis Date  . Asthma   . Chicken pox   . GERD (gastroesophageal reflux disease)   . Hyperlipidemia   . Hypertension     Past Surgical History:  Procedure Laterality Date  . CHOLECYSTECTOMY      Family History  Problem Relation Age of Onset  . Alcohol abuse Mother   . Depression Mother   . Diabetes Mother   . Alcohol abuse Father   . COPD Father   . Cancer Sister   . Hypertension Brother     Social History   Socioeconomic History  . Marital status: Married    Spouse name: Not on file  . Number of children: Not on file  . Years of education: Not on file  . Highest education level: Not on file  Occupational History  . Not on file  Tobacco Use  . Smoking status: Former Smoker    Quit date: 07/19/2010    Years since quitting: 10.2  . Smokeless tobacco: Never Used  Substance and Sexual Activity  . Alcohol use: Yes    Alcohol/week: 14.0 standard drinks    Types: 14 Glasses of wine per week    Comment: 3-4 glasses of wine daily  . Drug use: Never  . Sexual activity: Yes    Partners: Female  Other Topics Concern  . Not on file  Social History Narrative  . Not on file   Social Determinants of Health    Financial Resource Strain: Low Risk   . Difficulty of Paying Living Expenses: Not hard at all  Food Insecurity: No Food Insecurity  . Worried About Programme researcher, broadcasting/film/videounning Out of Food in the Last Year: Never true  . Ran Out of Food in the Last Year: Never true  Transportation Needs: No Transportation Needs  . Lack of Transportation (Medical): No  . Lack of Transportation (Non-Medical): No  Physical Activity: Sufficiently Active  . Days of Exercise per Week: 3 days  . Minutes of Exercise per Session: 60 min  Stress: No Stress Concern Present  . Feeling of Stress : Not at all  Social Connections: Moderately Integrated  . Frequency of Communication with Friends and Family: More than three times a week  . Frequency of Social Gatherings with Friends and Family: More than three times a week  . Attends Religious Services: 1 to 4 times per year  . Active Member of Clubs or Organizations: No  . Attends BankerClub or Organization Meetings: Never  . Marital Status: Married  Catering managerntimate Partner Violence: Not At Risk  . Fear of Current or Ex-Partner: No  . Emotionally Abused: No  . Physically Abused: No  . Sexually Abused: No    Outpatient Medications Prior to  Visit  Medication Sig Dispense Refill  . amitriptyline (ELAVIL) 25 MG tablet TAKE 1 TABLET AT BEDTIME 90 tablet 3  . carvedilol (COREG) 12.5 MG tablet Take 1 tablet (12.5 mg total) by mouth 2 (two) times daily. 180 tablet 3  . COVID-19 mRNA vaccine, Pfizer, 30 MCG/0.3ML injection Inject into the muscle. 0.3 mL 0  . finasteride (PROSCAR) 5 MG tablet TAKE 1 TABLET DAILY 90 tablet 3  . fluticasone (FLONASE) 50 MCG/ACT nasal spray USE 2 SPRAYS IN EACH NOSTRIL DAILY 48 g 3  . Fluticasone-Salmeterol (ADVAIR) 250-50 MCG/DOSE AEPB Inhale 1 puff into the lungs daily.    [name] Kitchen ibuprofen (ADVIL) 600 MG tablet Take 600 mg by mouth daily.    [name] Kitchen losartan (COZAAR) 100 MG tablet Take 1 tablet (100 mg total) by mouth daily. 90 tablet 3  . Multiple Vitamin (MULTIVITAMIN) tablet  Take 1 tablet by mouth daily.    [name] Kitchen omeprazole (PRILOSEC) 40 MG capsule TAKE 1 CAPSULE DAILY 90 capsule 3  . rosuvastatin (CRESTOR) 10 MG tablet Take 10 mg by mouth daily.    . tamsulosin (FLOMAX) 0.4 MG CAPS capsule TAKE 2 CAPSULES DAILY AFTER BREAKFAST 180 capsule 3  . gabapentin (NEURONTIN) 400 MG capsule Take 1 capsule (400 mg total) by mouth at bedtime. 30 capsule 2  . diclofenac Sodium (VOLTAREN) 1 % GEL Apply a small amount to painful joints on hand three times daily as needed. (Patient not taking: No sig reported) 100 g 2   No facility-administered medications prior to visit.    Allergies  Allergen Reactions  . Amoxicillin Diarrhea  . Morphine And Related Nausea And Vomiting    ROS Review of Systems  Constitutional: Negative for diaphoresis, fatigue, fever and unexpected weight change.  HENT: Negative.   Eyes: Negative for photophobia and visual disturbance.  Respiratory: Negative.   Cardiovascular: Negative.   Gastrointestinal: Negative.   Endocrine: Negative for polyphagia and polyuria.  Genitourinary: Negative.   Musculoskeletal: Positive for back pain.  Neurological: Negative for speech difficulty and numbness.  Hematological: Does not bruise/bleed easily.  Psychiatric/Behavioral: Negative.       Objective:    Physical Exam Vitals and nursing note reviewed.  Constitutional:      General: He is not in acute distress.    Appearance: Normal appearance. He is not ill-appearing, toxic-appearing or diaphoretic.  HENT:     Head: Normocephalic and atraumatic.     Right Ear: Tympanic membrane, ear canal and external ear normal.     Left Ear: Tympanic membrane, ear canal and external ear normal.     Mouth/Throat:     Mouth: Mucous membranes are moist.     Pharynx: Oropharynx is clear. No oropharyngeal exudate or posterior oropharyngeal erythema.  Eyes:     General: No scleral icterus.       Right eye: No discharge.        Left eye: No discharge.     Extraocular  Movements: Extraocular movements intact.     Conjunctiva/sclera: Conjunctivae normal.     Pupils: Pupils are equal, round, and reactive to light.  Cardiovascular:     Rate and Rhythm: Normal rate and regular rhythm.  Pulmonary:     Effort: Pulmonary effort is normal.     Breath sounds: Normal breath sounds.  Abdominal:     General: Bowel sounds are normal.  Musculoskeletal:     Cervical back: No rigidity or tenderness.     Lumbar back: Negative right straight leg raise test and negative left  straight leg raise test.  Lymphadenopathy:     Cervical: No cervical adenopathy.  Neurological:     Mental Status: He is alert and oriented to person, place, and time.     Motor: No weakness.  Psychiatric:        Mood and Affect: Mood normal.        Behavior: Behavior normal.     BP 124/78   Pulse 91   Temp (!) 97.4 F (36.3 C) (Temporal)   Ht 5\' 7"  (1.702 m)   Wt 191 lb 3.2 oz (86.7 kg)   SpO2 96%   BMI 29.95 kg/m  Wt Readings from Last 3 Encounters:  09/27/20 191 lb 3.2 oz (86.7 kg)  08/28/20 188 lb (85.3 kg)  07/10/20 192 lb 6.4 oz (87.3 kg)     There are no preventive care reminders to display for this patient.  There are no preventive care reminders to display for this patient.  Lab Results  Component Value Date   TSH 1.462 12/08/2019   Lab Results  Component Value Date   WBC 6.6 12/07/2019   HGB 14.4 12/07/2019   HCT 43.2 12/07/2019   MCV 94.3 12/07/2019   PLT 256 12/07/2019   Lab Results  Component Value Date   NA 138 12/07/2019   K 3.6 12/07/2019   CO2 21 (L) 12/07/2019   GLUCOSE 124 (H) 12/07/2019   BUN 12 12/07/2019   CREATININE 0.85 12/07/2019   BILITOT 0.5 06/28/2019   ALKPHOS 60 06/28/2019   AST 29 06/28/2019   ALT 32 06/28/2019   PROT 6.9 06/28/2019   ALBUMIN 4.0 06/28/2019   CALCIUM 9.2 12/07/2019   ANIONGAP 13 12/07/2019   GFR 80.50 11/29/2019   Lab Results  Component Value Date   CHOL 188 11/29/2019   Lab Results  Component Value Date    HDL 65.40 11/29/2019   Lab Results  Component Value Date   LDLCALC 108 (H) 11/29/2019   Lab Results  Component Value Date   TRIG 71.0 11/29/2019   Lab Results  Component Value Date   CHOLHDL 3 11/29/2019   No results found for: HGBA1C    Assessment & Plan:   Problem List Items Addressed This Visit      Cardiovascular and Mediastinum   Essential hypertension   Relevant Orders   CBC   Comprehensive metabolic panel   Urinalysis, Routine w reflex microscopic     Nervous and Auditory   Chronic bilateral low back pain with bilateral sciatica - Primary   Relevant Medications   predniSONE (STERAPRED UNI-PAK 48 TAB) 10 MG (48) TBPK tablet   gabapentin (NEURONTIN) 400 MG capsule   Other Relevant Orders   Ambulatory referral to Sports Medicine     Genitourinary   Benign prostatic hyperplasia with urinary hesitancy   Relevant Orders   PSA     Other   Elevated LDL cholesterol level   Relevant Orders   Comprehensive metabolic panel   LDL cholesterol, direct   Lipid panel      Meds ordered this encounter  Medications  . predniSONE (STERAPRED UNI-PAK 48 TAB) 10 MG (48) TBPK tablet    Sig: Instruct for 12 day pack.    Dispense:  48 tablet    Refill:  0  . gabapentin (NEURONTIN) 400 MG capsule    Sig: Take 1 capsule (400 mg total) by mouth 2 (two) times daily.    Dispense:  180 capsule    Refill:  1    Follow-up:  Return in about 2 months (around 11/27/2020), or Return fasting for ordered blood work..  Continue standing medicines.  We will start a 12-day Dosepak.  He has taken this before.  And increase Neurontin to twice daily.  Discussed prednisone side effects.  Mliss Sax, MD

## 2020-09-28 ENCOUNTER — Other Ambulatory Visit (INDEPENDENT_AMBULATORY_CARE_PROVIDER_SITE_OTHER): Payer: Medicare Other

## 2020-09-28 ENCOUNTER — Other Ambulatory Visit: Payer: Self-pay

## 2020-09-28 DIAGNOSIS — E7800 Pure hypercholesterolemia, unspecified: Secondary | ICD-10-CM

## 2020-09-28 DIAGNOSIS — I10 Essential (primary) hypertension: Secondary | ICD-10-CM

## 2020-09-28 DIAGNOSIS — N401 Enlarged prostate with lower urinary tract symptoms: Secondary | ICD-10-CM | POA: Diagnosis not present

## 2020-09-28 DIAGNOSIS — R3911 Hesitancy of micturition: Secondary | ICD-10-CM

## 2020-09-28 LAB — CBC
HCT: 42.1 % (ref 39.0–52.0)
Hemoglobin: 14.4 g/dL (ref 13.0–17.0)
MCHC: 34.2 g/dL (ref 30.0–36.0)
MCV: 93.6 fl (ref 78.0–100.0)
Platelets: 272 10*3/uL (ref 150.0–400.0)
RBC: 4.5 Mil/uL (ref 4.22–5.81)
RDW: 13.1 % (ref 11.5–15.5)
WBC: 5 10*3/uL (ref 4.0–10.5)

## 2020-09-28 LAB — COMPREHENSIVE METABOLIC PANEL
ALT: 33 U/L (ref 0–53)
AST: 29 U/L (ref 0–37)
Albumin: 4.1 g/dL (ref 3.5–5.2)
Alkaline Phosphatase: 64 U/L (ref 39–117)
BUN: 17 mg/dL (ref 6–23)
CO2: 29 mEq/L (ref 19–32)
Calcium: 9.4 mg/dL (ref 8.4–10.5)
Chloride: 102 mEq/L (ref 96–112)
Creatinine, Ser: 0.83 mg/dL (ref 0.40–1.50)
GFR: 86.25 mL/min (ref >60.00)
Glucose, Bld: 93 mg/dL (ref 70–99)
Potassium: 4.4 mEq/L (ref 3.5–5.1)
Sodium: 139 mEq/L (ref 135–145)
Total Bilirubin: 0.5 mg/dL (ref 0.2–1.2)
Total Protein: 7.1 g/dL (ref 6.0–8.3)

## 2020-09-28 LAB — LIPID PANEL
Cholesterol: 185 mg/dL (ref 0–200)
HDL: 72 mg/dL (ref >39.00)
LDL Cholesterol: 91 mg/dL (ref 0–99)
NonHDL: 113.17
Total CHOL/HDL Ratio: 3
Triglycerides: 112 mg/dL (ref 0.0–149.0)
VLDL: 22.4 mg/dL (ref 0.0–40.0)

## 2020-09-28 LAB — URINALYSIS, ROUTINE W REFLEX MICROSCOPIC
Bilirubin Urine: NEGATIVE
Hgb urine dipstick: NEGATIVE
Ketones, ur: NEGATIVE
Leukocytes,Ua: NEGATIVE
Nitrite: NEGATIVE
RBC / HPF: NONE SEEN (ref 0–?)
Specific Gravity, Urine: 1.01 (ref 1.000–1.030)
Total Protein, Urine: NEGATIVE
Urine Glucose: NEGATIVE
Urobilinogen, UA: 0.2 (ref 0.0–1.0)
pH: 5 (ref 5.0–8.0)

## 2020-09-28 LAB — PSA: PSA: 0.84 ng/mL (ref 0.10–4.00)

## 2020-09-28 LAB — LDL CHOLESTEROL, DIRECT: Direct LDL: 112 mg/dL

## 2020-09-28 NOTE — Progress Notes (Signed)
Per orders of  Dr. Doreene Burke pt is here for lab draw, pt tolerated lab draw well.

## 2020-10-01 NOTE — Progress Notes (Signed)
[name] [address] [place] [address] Phone: [phone] Subjective:   Jonathon Johnson, am serving as a scribe for Dr. Antoine Primas. This visit occurred during the SARS-CoV-2 public health emergency.  Safety protocols were in place, including screening questions prior to the visit, additional usage of staff PPE, and extensive cleaning of exam room while observing appropriate contact time as indicated for disinfecting solutions.   I'm seeing this patient by the request  of:  Mliss Sax, MD  CC: low back pain   [id number]  Jonathon Johnson is a 75 y.o. male coming in with complaint of lumbar spine pain for past 6 months. Having radicular symptoms that travel from glutes to the calves in both sides. Using gabapentin and prednisone for pain relief. Pain was constant but is only present now in the morning.   MRI lumbar spine 09/01/2020  IMPRESSION: 1. Advanced disc and facet degeneration with scoliosis. 2. L4-5 severe spinal stenosis. 3. Moderate foraminal stenosis on the right at L3-4 and left at L5-S1.       Past Medical History:  Diagnosis Date  . Asthma   . Chicken pox   . GERD (gastroesophageal reflux disease)   . Hyperlipidemia   . Hypertension    Past Surgical History:  Procedure Laterality Date  . CHOLECYSTECTOMY     Social History   Socioeconomic History  . Marital status: Married    Spouse name: Not on file  . Number of children: Not on file  . Years of education: Not on file  . Highest education level: Not on file  Occupational History  . Not on file  Tobacco Use  . Smoking status: Former Smoker    Quit date: 07/19/2010    Years since quitting: 10.2  . Smokeless tobacco: Never Used  Substance and Sexual Activity  . Alcohol use: Yes    Alcohol/week: 14.0 standard drinks    Types: 14 Glasses of wine per week    Comment: 3-4 glasses of wine daily  . Drug use: Never  . Sexual activity: Yes     Partners: Female  Other Topics Concern  . Not on file  Social History Narrative  . Not on file   Social Determinants of Health   Financial Resource Strain: Low Risk   . Difficulty of Paying Living Expenses: Not hard at all  Food Insecurity: No Food Insecurity  . Worried About Programme researcher, broadcasting/film/video in the Last Year: Never true  . Ran Out of Food in the Last Year: Never true  Transportation Needs: No Transportation Needs  . Lack of Transportation (Medical): No  . Lack of Transportation (Non-Medical): No  Physical Activity: Sufficiently Active  . Days of Exercise per Week: 3 days  . Minutes of Exercise per Session: 60 min  Stress: No Stress Concern Present  . Feeling of Stress : Not at all  Social Connections: Moderately Integrated  . Frequency of Communication with Friends and Family: More than three times a week  . Frequency of Social Gatherings with Friends and Family: More than three times a week  . Attends Religious Services: 1 to 4 times per year  . Active Member of Clubs or Organizations: No  . Attends Banker Meetings: Never  . Marital Status: Married   Allergies  Allergen Reactions  . Amoxicillin Diarrhea  . Morphine And Related Nausea And Vomiting   Family History  Problem Relation Age of Onset  . Alcohol abuse Mother   .  Depression Mother   . Diabetes Mother   . Alcohol abuse Father   . COPD Father   . Cancer Sister   . Hypertension Brother     Current Outpatient Medications (Endocrine & Metabolic):  .  predniSONE (STERAPRED UNI-PAK 48 TAB) 10 MG (48) TBPK tablet, Instruct for 12 day pack.  Current Outpatient Medications (Cardiovascular):  .  carvedilol (COREG) 12.5 MG tablet, Take 1 tablet (12.5 mg total) by mouth 2 (two) times daily. [name] Kitchen  losartan (COZAAR) 100 MG tablet, Take 1 tablet (100 mg total) by mouth daily. .  rosuvastatin (CRESTOR) 10 MG tablet, Take 10 mg by mouth daily.  Current Outpatient Medications (Respiratory):  .  fluticasone  (FLONASE) 50 MCG/ACT nasal spray, USE 2 SPRAYS IN EACH NOSTRIL DAILY .  Fluticasone-Salmeterol (ADVAIR) 250-50 MCG/DOSE AEPB, Inhale 1 puff into the lungs daily.  Current Outpatient Medications (Analgesics):  .  ibuprofen (ADVIL) 600 MG tablet, Take 600 mg by mouth daily.   Current Outpatient Medications (Other):  .  amitriptyline (ELAVIL) 25 MG tablet, TAKE 1 TABLET AT BEDTIME .  COVID-19 mRNA vaccine, Pfizer, 30 MCG/0.3ML injection, Inject into the muscle. .  finasteride (PROSCAR) 5 MG tablet, TAKE 1 TABLET DAILY .  gabapentin (NEURONTIN) 400 MG capsule, Take 1 capsule (400 mg total) by mouth 2 (two) times daily. .  Multiple Vitamin (MULTIVITAMIN) tablet, Take 1 tablet by mouth daily. [name] Kitchen  omeprazole (PRILOSEC) 40 MG capsule, TAKE 1 CAPSULE DAILY .  tamsulosin (FLOMAX) 0.4 MG CAPS capsule, TAKE 2 CAPSULES DAILY AFTER BREAKFAST   Reviewed prior external information including notes and imaging from  primary care provider As well as notes that were available from care everywhere and other healthcare systems.  Past medical history, social, surgical and family history all reviewed in electronic medical record.  No pertanent information unless stated regarding to the chief complaint.   Review of Systems:  No headache, visual changes, nausea, vomiting, diarrhea, constipation, dizziness, abdominal pain, skin rash, fevers, chills, night sweats, weight loss, swollen lymph nodes, body aches, joint swelling, chest pain, shortness of breath, mood changes. POSITIVE muscle aches  Objective  Blood pressure 132/86, pulse 87, height 5\' 7"  (1.702 m), weight 188 lb (85.3 kg), SpO2 97 %.   General: No apparent distress alert and oriented x3 mood and affect normal, dressed appropriately.  HEENT: Pupils equal, extraocular movements intact  Respiratory: Patient's speak in full sentences and does not appear short of breath  Cardiovascular: No lower extremity edema, non tender, no erythema  Gait normal with  good balance and coordination.  MSK: Patient's low back exam does have significant loss of lordosis.  Poor core strength noted.  Significant tightness of the hamstrings bilaterally.  Patient does lack the last 10 degrees of flexion.  Patient unable to truly do secondary to tightness.  Neurovascularly intact distally.  4-5 strength of the lower extremities but symmetric.      Impression and Recommendations:     The above documentation has been reviewed and is accurate and complete Pearlean Brownie, DO

## 2020-10-02 ENCOUNTER — Other Ambulatory Visit: Payer: Self-pay

## 2020-10-02 ENCOUNTER — Ambulatory Visit (INDEPENDENT_AMBULATORY_CARE_PROVIDER_SITE_OTHER): Payer: Medicare Other | Admitting: Family Medicine

## 2020-10-02 ENCOUNTER — Encounter: Payer: Self-pay | Admitting: Family Medicine

## 2020-10-02 DIAGNOSIS — G8929 Other chronic pain: Secondary | ICD-10-CM | POA: Diagnosis not present

## 2020-10-02 DIAGNOSIS — M5441 Lumbago with sciatica, right side: Secondary | ICD-10-CM | POA: Diagnosis not present

## 2020-10-02 DIAGNOSIS — M5442 Lumbago with sciatica, left side: Secondary | ICD-10-CM | POA: Diagnosis not present

## 2020-10-02 NOTE — Patient Instructions (Signed)
Good to see you Vitamin D 2,000 daily Whey protein isolate 30 mins after work out Exercise 3 times a week See me again in 6-8 weeks

## 2020-10-02 NOTE — Assessment & Plan Note (Signed)
Patient is having bilateral back pain.  Enhancement disc and facet degeneration was noted on the MRI.  Patient though does have an L4-L5 severe spinal stenosis that I think is more likely contributing to more of the discomfort and pain at this time and is consistent with some of patient's symptoms.  Discussed different treatment options.  Patient is already on gabapentin, ibuprofen and has been on prednisone previously.  Taking amitriptyline.  Discussed with patient about the possibility of epidural injections.  Discussed potential formal physical therapy.  Patient states at this point with the increase in the gabapentin as well as the prednisone he is feeling really good.  We will have him continue to work with a trainer and given home exercises the patient went over with the athletic trainer.  No change in medications but did discuss vitamin D supplementation.  Discussed weight proteins to help with muscle strength and endurance.  Patient will try this conservative therapy for the next 6 weeks.  At that point if having worsening pain will consider the possibility of epidural

## 2020-10-07 ENCOUNTER — Other Ambulatory Visit: Payer: Self-pay | Admitting: Family Medicine

## 2020-10-07 DIAGNOSIS — I10 Essential (primary) hypertension: Secondary | ICD-10-CM

## 2020-10-12 ENCOUNTER — Encounter: Payer: Self-pay | Admitting: Family Medicine

## 2020-10-12 NOTE — Telephone Encounter (Signed)
Not sure. Could be zoster. Needs to be seen somewhere.

## 2020-10-14 ENCOUNTER — Ambulatory Visit (HOSPITAL_COMMUNITY)
Admission: EM | Admit: 2020-10-14 | Discharge: 2020-10-14 | Disposition: A | Discharge status: Home / Self Care | Payer: Medicare Other | Attending: Student | Admitting: Student

## 2020-10-14 ENCOUNTER — Other Ambulatory Visit: Payer: Self-pay

## 2020-10-14 ENCOUNTER — Encounter (HOSPITAL_COMMUNITY): Payer: Self-pay

## 2020-10-14 DIAGNOSIS — B029 Zoster without complications: Secondary | ICD-10-CM

## 2020-10-14 MED ORDER — VALACYCLOVIR HCL 1 G PO TABS: 1000.0000 mg | ORAL_TABLET | Freq: Three times a day (TID) | ORAL | 0 refills | Status: AC
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
## 2020-10-14 NOTE — ED Triage Notes (Signed)
Pt reports burning and painful  rash in the back x 4 days.

## 2020-10-14 NOTE — Discharge Instructions (Addendum)
-  Valacyclovir 3 times daily for 7 days. -Take Tylenol 1000 mg 3 times daily, and ibuprofen 800 mg 3 times daily with food.  You can take these together, or alternate every 3-4 hours. -Seek additional immediate medical attention if you develop new symptoms like fever/chills, increasing redness surrounding the rash, fast heart rate, dizziness, chest pain, shortness of breath.

## 2020-10-18 ENCOUNTER — Encounter: Payer: Self-pay | Admitting: Family Medicine

## 2020-10-23 ENCOUNTER — Encounter: Payer: Self-pay | Admitting: Family Medicine

## 2020-10-23 DIAGNOSIS — B028 Zoster with other complications: Secondary | ICD-10-CM

## 2020-10-23 MED ORDER — PREGABALIN 50 MG PO CAPS: ORAL_CAPSULE | ORAL | 1 refills | Status: DC

## 2020-10-23 MED ORDER — PREDNISONE 10 MG (21) PO TBPK: ORAL_TABLET | ORAL | 0 refills | Status: DC

## 2020-10-25 ENCOUNTER — Encounter: Payer: Self-pay | Admitting: Family Medicine

## 2020-11-01 ENCOUNTER — Other Ambulatory Visit: Payer: Self-pay | Admitting: Family Medicine

## 2020-11-07 ENCOUNTER — Encounter: Payer: Self-pay | Admitting: Family Medicine

## 2020-11-08 ENCOUNTER — Ambulatory Visit (INDEPENDENT_AMBULATORY_CARE_PROVIDER_SITE_OTHER): Payer: Medicare Other | Admitting: Family Medicine

## 2020-11-08 ENCOUNTER — Encounter: Payer: Self-pay | Admitting: Family Medicine

## 2020-11-08 ENCOUNTER — Other Ambulatory Visit: Payer: Self-pay

## 2020-11-08 VITALS — BP 142/78 | HR 76 | Temp 97.5°F | Ht 67.0 in | Wt 191.4 lb

## 2020-11-08 DIAGNOSIS — B0229 Other postherpetic nervous system involvement: Secondary | ICD-10-CM | POA: Insufficient documentation

## 2020-11-08 MED ORDER — PREGABALIN 150 MG PO CAPS: ORAL_CAPSULE | ORAL | 1 refills | Status: DC

## 2020-11-08 MED ORDER — LIDOCAINE 5 % EX PTCH: 1.0000 | MEDICATED_PATCH | CUTANEOUS | 1 refills | Status: DC

## 2020-11-08 MED ORDER — PREDNISONE 20 MG PO TABS: 20.0000 mg | ORAL_TABLET | Freq: Two times a day (BID) | ORAL | 0 refills | Status: AC

## 2020-11-08 NOTE — Progress Notes (Signed)
Established Patient Office Visit  Subjective:  Patient ID: Jonathon Johnson, male    DOB: January 16, 1946  Age: 75 y.o. MRN: [id number]  CC:  Chief Complaint  Patient presents with  . Pain    Still in lots of pain from shingles patient unable to sleep.    HPI Jonathon Johnson presents for follow-up of pain associated with zoster rash that was diagnosed back on 8 May.  He was treated with famciclovir.  He completed his course of therapy.  He has been having burning and searing pain in the area of the rash.  He was already taking Neurontin and we increased the dosage.  This was not helpful.  He was switched to pregabalin 50 mg 1-2 every 8.  He has been taking 2 every 8 hours and ran out before he can see me today.  There is a specific spot on his back that wakes him up at night with this burning pain.  He had the Zostavax several years ago.  Past Medical History:  Diagnosis Date  . Asthma   . Chicken pox   . GERD (gastroesophageal reflux disease)   . Hyperlipidemia   . Hypertension     Past Surgical History:  Procedure Laterality Date  . CHOLECYSTECTOMY      Family History  Problem Relation Age of Onset  . Alcohol abuse Mother   . Depression Mother   . Diabetes Mother   . Alcohol abuse Father   . COPD Father   . Cancer Sister   . Hypertension Brother     Social History   Socioeconomic History  . Marital status: Married    Spouse name: Not on file  . Number of children: Not on file  . Years of education: Not on file  . Highest education level: Not on file  Occupational History  . Not on file  Tobacco Use  . Smoking status: Former Smoker    Quit date: 07/19/2010    Years since quitting: 10.3  . Smokeless tobacco: Never Used  Substance and Sexual Activity  . Alcohol use: Yes    Alcohol/week: 14.0 standard drinks    Types: 14 Glasses of wine per week    Comment: 3-4 glasses of wine daily  . Drug use: Never  . Sexual activity: Yes    Partners: Female  Other Topics  Concern  . Not on file  Social History Narrative  . Not on file   Social Determinants of Health   Financial Resource Strain: Low Risk   . Difficulty of Paying Living Expenses: Not hard at all  Food Insecurity: No Food Insecurity  . Worried About Programme researcher, broadcasting/film/video in the Last Year: Never true  . Ran Out of Food in the Last Year: Never true  Transportation Needs: No Transportation Needs  . Lack of Transportation (Medical): No  . Lack of Transportation (Non-Medical): No  Physical Activity: Sufficiently Active  . Days of Exercise per Week: 3 days  . Minutes of Exercise per Session: 60 min  Stress: No Stress Concern Present  . Feeling of Stress : Not at all  Social Connections: Moderately Integrated  . Frequency of Communication with Friends and Family: More than three times a week  . Frequency of Social Gatherings with Friends and Family: More than three times a week  . Attends Religious Services: 1 to 4 times per year  . Active Member of Clubs or Organizations: No  . Attends Banker Meetings: Never  . Marital  Status: Married  Catering manager Violence: Not At Risk  . Fear of Current or Ex-Partner: No  . Emotionally Abused: No  . Physically Abused: No  . Sexually Abused: No    Outpatient Medications Prior to Visit  Medication Sig Dispense Refill  . amitriptyline (ELAVIL) 25 MG tablet TAKE 1 TABLET AT BEDTIME 90 tablet 3  . carvedilol (COREG) 12.5 MG tablet Take 1 tablet (12.5 mg total) by mouth 2 (two) times daily. 180 tablet 3  . finasteride (PROSCAR) 5 MG tablet TAKE 1 TABLET DAILY 90 tablet 3  . fluticasone (FLONASE) 50 MCG/ACT nasal spray USE 2 SPRAYS IN EACH NOSTRIL DAILY 48 g 3  . Fluticasone-Salmeterol (ADVAIR) 250-50 MCG/DOSE AEPB Inhale 1 puff into the lungs daily.    [name] Kitchen ibuprofen (ADVIL) 600 MG tablet Take 600 mg by mouth daily.    [name] Kitchen losartan (COZAAR) 100 MG tablet TAKE 1 TABLET DAILY 90 tablet 3  . Multiple Vitamin (MULTIVITAMIN) tablet Take 1 tablet by  mouth daily.    [name] Kitchen omeprazole (PRILOSEC) 40 MG capsule TAKE 1 CAPSULE DAILY 90 capsule 3  . rosuvastatin (CRESTOR) 10 MG tablet Take 10 mg by mouth daily.    . tamsulosin (FLOMAX) 0.4 MG CAPS capsule TAKE 2 CAPSULES DAILY AFTER BREAKFAST 180 capsule 3  . gabapentin (NEURONTIN) 100 MG capsule Take 100 mg by mouth 3 (three) times daily.    [name] Kitchen COVID-19 mRNA vaccine, Pfizer, 30 MCG/0.3ML injection Inject into the muscle. (Patient not taking: Reported on 11/08/2020) 0.3 mL 0  . predniSONE (STERAPRED UNI-PAK 21 TAB) 10 MG (21) TBPK tablet Take 6 today, 5 tomorrow, 4 the next day and then 3, 2, 1 and stopTake 6 today, 5 tomorrow, 4 the next day and then 3, 2, 1 and stop 21 tablet 0  . pregabalin (LYRICA) 50 MG capsule Take one to two capsules every 8 hours. (Patient not taking: Reported on 11/08/2020) 60 capsule 1   No facility-administered medications prior to visit.    Allergies  Allergen Reactions  . Amoxicillin Diarrhea  . Morphine And Related Nausea And Vomiting    ROS Review of Systems  Constitutional: Negative.   Respiratory: Negative.   Cardiovascular: Negative.   Gastrointestinal: Negative.   Musculoskeletal: Positive for back pain.  Skin: Positive for color change and rash.  Hematological: Does not bruise/bleed easily.  Psychiatric/Behavioral: Negative.       Objective:    Physical Exam Vitals and nursing note reviewed.  Constitutional:      General: He is not in acute distress.    Appearance: Normal appearance. He is not ill-appearing, toxic-appearing or diaphoretic.  HENT:     Head: Normocephalic and atraumatic.  Eyes:     General: No scleral icterus.       Right eye: No discharge.        Left eye: No discharge.     Conjunctiva/sclera: Conjunctivae normal.  Pulmonary:     Effort: Pulmonary effort is normal.  Skin:    General: Skin is warm and dry.       Neurological:     Mental Status: He is alert and oriented to person, place, and time.  Psychiatric:        Mood  and Affect: Mood normal.        Behavior: Behavior normal.     BP (!) 142/78   Pulse 76   Temp (!) 97.5 F (36.4 C) (Temporal)   Ht 5\' 7"  (1.702 m)   Wt 191 lb 6.4 oz (86.8  kg)   SpO2 98%   BMI 29.98 kg/m  Wt Readings from Last 3 Encounters:  11/08/20 191 lb 6.4 oz (86.8 kg)  10/02/20 188 lb (85.3 kg)  09/27/20 191 lb 3.2 oz (86.7 kg)     Health Maintenance Due  Topic Date Due  . Zoster Vaccines- Shingrix (1 of 2) Never done    There are no preventive care reminders to display for this patient.  Lab Results  Component Value Date   TSH 1.462 12/08/2019   Lab Results  Component Value Date   WBC 5.0 09/28/2020   HGB 14.4 09/28/2020   HCT 42.1 09/28/2020   MCV 93.6 09/28/2020   PLT 272.0 09/28/2020   Lab Results  Component Value Date   NA 139 09/28/2020   K 4.4 09/28/2020   CO2 29 09/28/2020   GLUCOSE 93 09/28/2020   BUN 17 09/28/2020   CREATININE 0.83 09/28/2020   BILITOT 0.5 09/28/2020   ALKPHOS 64 09/28/2020   AST 29 09/28/2020   ALT 33 09/28/2020   PROT 7.1 09/28/2020   ALBUMIN 4.1 09/28/2020   CALCIUM 9.4 09/28/2020   ANIONGAP 13 12/07/2019   GFR 86.25 09/28/2020   Lab Results  Component Value Date   CHOL 185 09/28/2020   Lab Results  Component Value Date   HDL 72.00 09/28/2020   Lab Results  Component Value Date   LDLCALC 91 09/28/2020   Lab Results  Component Value Date   TRIG 112.0 09/28/2020   Lab Results  Component Value Date   CHOLHDL 3 09/28/2020   No results found for: HGBA1C    Assessment & Plan:   Problem List Items Addressed This Visit      Nervous and Auditory   Post herpetic neuralgia - Primary   Relevant Medications   pregabalin (LYRICA) 150 MG capsule   predniSONE (DELTASONE) 20 MG tablet   lidocaine (LIDODERM) 5 %   Other Relevant Orders   Ambulatory referral to Neurology      Meds ordered this encounter  Medications  . pregabalin (LYRICA) 150 MG capsule    Sig: Take one capsule every 8 hours.     Dispense:  90 capsule    Refill:  1  . predniSONE (DELTASONE) 20 MG tablet    Sig: Take 1 tablet (20 mg total) by mouth 2 (two) times daily with a meal for 7 days.    Dispense:  14 tablet    Refill:  0  . lidocaine (LIDODERM) 5 %    Sig: Place 1 patch onto the skin daily. Remove & Discard patch within 12 hours or as directed by MD    Dispense:  30 patch    Refill:  1    Follow-up: Return if symptoms worsen or fail to improve.   And increase the Lyrica to 150 mg every 8.  Patient has been on recent prednisone courses for lower back pain.  Prescribed 20 mg of prednisone twice daily for 7 days.  He will use Lidoderm patch daily on the most painful area for 12 hours and on for 12 hours.  Have asked for neurology consultation within 2 weeks. Mliss Sax, MD

## 2020-11-09 ENCOUNTER — Encounter: Payer: Self-pay | Admitting: Neurology

## 2020-11-13 ENCOUNTER — Ambulatory Visit: Payer: Medicare Other | Admitting: Family Medicine

## 2020-11-26 ENCOUNTER — Ambulatory Visit: Payer: Medicare Other | Admitting: Family Medicine

## 2020-12-03 NOTE — Progress Notes (Signed)
[name] [address] [place] [address] Phone: [phone] Subjective:   I Jonathon Johnson am serving as a Neurosurgeon for Dr. Antoine Primas.  This visit occurred during the SARS-CoV-2 public health emergency.  Safety protocols were in place, including screening questions prior to the visit, additional usage of staff PPE, and extensive cleaning of exam room while observing appropriate contact time as indicated for disinfecting solutions.   I'm seeing this patient by the request  of:  Mliss Sax, MD  CC: Low back pain follow-up  ELF:[id number]  10/02/2020 Patient is having bilateral back pain.  Enhancement disc and facet degeneration was noted on the MRI.  Patient though does have an L4-L5 severe spinal stenosis that I think is more likely contributing to more of the discomfort and pain at this time and is consistent with some of patient's symptoms.  Discussed different treatment options.  Patient is already on gabapentin, ibuprofen and has been on prednisone previously.  Taking amitriptyline.  Discussed with patient about the possibility of epidural injections.  Discussed potential formal physical therapy.  Patient states at this point with the increase in the gabapentin as well as the prednisone he is feeling really good.  We will have him continue to work with a trainer and given home exercises the patient went over with the athletic trainer.  No change in medications but did discuss vitamin D supplementation.  Discussed weight proteins to help with muscle strength and endurance.  Patient will try this conservative therapy for the next 6 weeks.  At that point if having worsening pain will consider the possibility of epidural  Update 12/04/2020 Jonathon Johnson is a 75 y.o. male coming in with complaint of low back pain. Patient states he still has shooting pain down his legs that is not all the time. Usually every morning but lately it has continued into  the afternoon.  Patient states that unfortunately he would say he is about 0% better from previous exam.  Patient states that if he stands too long anyone place for any extension of the back such as looking up he starts having severe pain with radiation down the legs.  He seems to get significantly better though with sitting.  Denies though any significant nighttime awakening.  Patient though is unable to walk long distances secondary to the discomfort and pain.  Affecting daily activities at this time.      Past Medical History:  Diagnosis Date   Asthma    Chicken pox    GERD (gastroesophageal reflux disease)    Hyperlipidemia    Hypertension    Past Surgical History:  Procedure Laterality Date   CHOLECYSTECTOMY     Social History   Socioeconomic History   Marital status: Married    Spouse name: Not on file   Number of children: Not on file   Years of education: Not on file   Highest education level: Not on file  Occupational History   Not on file  Tobacco Use   Smoking status: Former    Pack years: 0.00    Types: Cigarettes    Quit date: 07/19/2010    Years since quitting: 10.3   Smokeless tobacco: Never  Substance and Sexual Activity   Alcohol use: Yes    Alcohol/week: 14.0 standard drinks    Types: 14 Glasses of wine per week    Comment: 3-4 glasses of wine daily   Drug use: Never   Sexual activity: Yes  Partners: Female  Other Topics Concern   Not on file  Social History Narrative   Not on file   Social Determinants of Health   Financial Resource Strain: Low Risk    Difficulty of Paying Living Expenses: Not hard at all  Food Insecurity: No Food Insecurity   Worried About Programme researcher, broadcasting/film/video in the Last Year: Never true   Barista in the Last Year: Never true  Transportation Needs: No Transportation Needs   Lack of Transportation (Medical): No   Lack of Transportation (Non-Medical): No  Physical Activity: Sufficiently Active   Days of Exercise per  Week: 3 days   Minutes of Exercise per Session: 60 min  Stress: No Stress Concern Present   Feeling of Stress : Not at all  Social Connections: Moderately Integrated   Frequency of Communication with Friends and Family: More than three times a week   Frequency of Social Gatherings with Friends and Family: More than three times a week   Attends Religious Services: 1 to 4 times per year   Active Member of [employer] or Organizations: No   Attends Engineer, structural: Never   Marital Status: Married   Allergies  Allergen Reactions   Amoxicillin Diarrhea   Morphine And Related Nausea And Vomiting   Family History  Problem Relation Age of Onset   Alcohol abuse Mother    Depression Mother    Diabetes Mother    Alcohol abuse Father    COPD Father    Cancer Sister    Hypertension Brother      Current Outpatient Medications (Cardiovascular):    carvedilol (COREG) 12.5 MG tablet, Take 1 tablet (12.5 mg total) by mouth 2 (two) times daily.   losartan (COZAAR) 100 MG tablet, TAKE 1 TABLET DAILY   rosuvastatin (CRESTOR) 10 MG tablet, Take 10 mg by mouth daily.  Current Outpatient Medications (Respiratory):    fluticasone (FLONASE) 50 MCG/ACT nasal spray, USE 2 SPRAYS IN EACH NOSTRIL DAILY   Fluticasone-Salmeterol (ADVAIR) 250-50 MCG/DOSE AEPB, Inhale 1 puff into the lungs daily.  Current Outpatient Medications (Analgesics):    ibuprofen (ADVIL) 600 MG tablet, Take 600 mg by mouth daily.   Current Outpatient Medications (Other):    acyclovir (ZOVIRAX) 400 MG tablet, Take 1 tablet (400 mg total) by mouth 3 (three) times daily.   amitriptyline (ELAVIL) 25 MG tablet, TAKE 1 TABLET AT BEDTIME   COVID-19 mRNA vaccine, Pfizer, 30 MCG/0.3ML injection, Inject into the muscle.   finasteride (PROSCAR) 5 MG tablet, TAKE 1 TABLET DAILY   lidocaine (LIDODERM) 5 %, Place 1 patch onto the skin daily. Remove & Discard patch within 12 hours or as directed by MD   Multiple Vitamin (MULTIVITAMIN)  tablet, Take 1 tablet by mouth daily.   omeprazole (PRILOSEC) 40 MG capsule, TAKE 1 CAPSULE DAILY   pregabalin (LYRICA) 150 MG capsule, Take one capsule every 8 hours.   tamsulosin (FLOMAX) 0.4 MG CAPS capsule, TAKE 2 CAPSULES DAILY AFTER BREAKFAST   Reviewed prior external information including notes and imaging from  primary care provider As well as notes that were available from care everywhere and other healthcare systems.  Past medical history, social, surgical and family history all reviewed in electronic medical record.  No pertanent information unless stated regarding to the chief complaint.   Review of Systems:  No headache, visual changes, nausea, vomiting, diarrhea, constipation, dizziness, abdominal pain, skin rash, fevers, chills, night sweats, weight loss, swollen lymph nodes, body aches, joint  swelling, chest pain, shortness of breath, mood changes. POSITIVE muscle aches  Objective  Blood pressure 130/80, pulse 82, height 5\' 7"  (1.702 m), weight 192 lb (87.1 kg), SpO2 97 %.   General: No apparent distress alert and oriented x3 mood and affect normal, dressed appropriately.  HEENT: Pupils equal, extraocular movements intact  Respiratory: Patient's speak in full sentences and does not appear short of breath  Cardiovascular: No lower extremity edema, non tender, no erythema  Gait normal with good balance and coordination.  MSK: Patient's low back does have some poor core strength.  Tenderness to palpation in the paraspinal musculature of the lumbar spine.  Significant tightness with straight leg test.  Neurovascularly intact distally.    Impression and Recommendations:     The above documentation has been reviewed and is accurate and complete , DO

## 2020-12-04 ENCOUNTER — Ambulatory Visit (INDEPENDENT_AMBULATORY_CARE_PROVIDER_SITE_OTHER): Payer: Medicare Other | Admitting: Family Medicine

## 2020-12-04 ENCOUNTER — Encounter: Payer: Self-pay | Admitting: Family Medicine

## 2020-12-04 ENCOUNTER — Other Ambulatory Visit: Payer: Self-pay

## 2020-12-04 VITALS — BP 130/80 | HR 82 | Ht 67.0 in | Wt 192.0 lb

## 2020-12-04 DIAGNOSIS — M545 Low back pain, unspecified: Secondary | ICD-10-CM

## 2020-12-04 DIAGNOSIS — G8929 Other chronic pain: Secondary | ICD-10-CM | POA: Diagnosis not present

## 2020-12-04 DIAGNOSIS — M5441 Lumbago with sciatica, right side: Secondary | ICD-10-CM

## 2020-12-04 DIAGNOSIS — M5442 Lumbago with sciatica, left side: Secondary | ICD-10-CM

## 2020-12-04 MED ORDER — ACYCLOVIR 400 MG PO TABS: 400.0000 mg | ORAL_TABLET | Freq: Three times a day (TID) | ORAL | 0 refills | Status: DC

## 2020-12-04 NOTE — Patient Instructions (Addendum)
Good to see you Epidural L4-L5 Acyclovir 400 mg 3 times daily for 5 days Start it a day before injection [facility] [phone] See me again in 4 weeks after epidural

## 2020-12-04 NOTE — Assessment & Plan Note (Signed)
Patient has severe spinal stenosis noted on the MRI.  MRI of the L4-L5 is consistent with patient's symptoms and likely the L5 nerve root impingements.  At this point I do feel that patient has failed conservative therapy and an epidural would be beneficial.  Patient will get the epidural and follow-up with me again in 4 to 5 weeks.  Patient is on the Lyrica at this moment.  We will likely need to titrate down at some point.  Continue the amitriptyline as well.  Follow-up once again in 4 to 5 weeks after the epidural

## 2020-12-11 ENCOUNTER — Ambulatory Visit
Admission: RE | Admit: 2020-12-11 | Discharge: 2020-12-11 | Disposition: A | Discharge status: Home / Self Care | Payer: Medicare Other | Source: Ambulatory Visit | Attending: Family Medicine | Admitting: Family Medicine

## 2020-12-11 DIAGNOSIS — M545 Low back pain, unspecified: Secondary | ICD-10-CM

## 2020-12-11 DIAGNOSIS — M47816 Spondylosis without myelopathy or radiculopathy, lumbar region: Secondary | ICD-10-CM | POA: Diagnosis not present

## 2020-12-11 DIAGNOSIS — M48061 Spinal stenosis, lumbar region without neurogenic claudication: Secondary | ICD-10-CM | POA: Diagnosis not present

## 2020-12-11 MED ORDER — IOPAMIDOL (ISOVUE-M 200) INJECTION 41%
1.0000 mL | Freq: Once | INTRAMUSCULAR | Status: AC
  Administered 2020-12-11: 1 mL via EPIDURAL

## 2020-12-11 MED ORDER — METHYLPREDNISOLONE ACETATE 40 MG/ML INJ SUSP (RADIOLOG
80.0000 mg | Freq: Once | INTRAMUSCULAR | Status: AC
  Administered 2020-12-11: 80 mg via EPIDURAL

## 2020-12-11 NOTE — Discharge Instructions (Signed)

## 2020-12-19 ENCOUNTER — Encounter: Payer: Self-pay | Admitting: Family Medicine

## 2020-12-19 DIAGNOSIS — B0229 Other postherpetic nervous system involvement: Secondary | ICD-10-CM

## 2020-12-20 ENCOUNTER — Other Ambulatory Visit: Payer: Self-pay | Admitting: Family Medicine

## 2020-12-20 ENCOUNTER — Other Ambulatory Visit: Payer: Self-pay

## 2020-12-20 ENCOUNTER — Other Ambulatory Visit: Payer: Self-pay | Admitting: Cardiology

## 2020-12-20 DIAGNOSIS — R3911 Hesitancy of micturition: Secondary | ICD-10-CM

## 2020-12-20 DIAGNOSIS — M545 Low back pain, unspecified: Secondary | ICD-10-CM

## 2020-12-20 DIAGNOSIS — N401 Enlarged prostate with lower urinary tract symptoms: Secondary | ICD-10-CM

## 2020-12-20 DIAGNOSIS — G8929 Other chronic pain: Secondary | ICD-10-CM

## 2020-12-21 ENCOUNTER — Ambulatory Visit
Admission: RE | Admit: 2020-12-21 | Discharge: 2020-12-21 | Disposition: A | Discharge status: Home / Self Care | Payer: Medicare Other | Source: Ambulatory Visit | Attending: Family Medicine | Admitting: Family Medicine

## 2020-12-21 ENCOUNTER — Other Ambulatory Visit: Payer: Self-pay

## 2020-12-21 DIAGNOSIS — M47817 Spondylosis without myelopathy or radiculopathy, lumbosacral region: Secondary | ICD-10-CM | POA: Diagnosis not present

## 2020-12-21 DIAGNOSIS — G8929 Other chronic pain: Secondary | ICD-10-CM

## 2020-12-21 MED ORDER — METHYLPREDNISOLONE ACETATE 40 MG/ML INJ SUSP (RADIOLOG
80.0000 mg | Freq: Once | INTRAMUSCULAR | Status: AC
  Administered 2020-12-21: 80 mg via EPIDURAL

## 2020-12-21 MED ORDER — IOPAMIDOL (ISOVUE-M 200) INJECTION 41%
1.0000 mL | Freq: Once | INTRAMUSCULAR | Status: AC
  Administered 2020-12-21: 1 mL via EPIDURAL

## 2020-12-21 NOTE — Discharge Instructions (Signed)

## 2020-12-23 MED ORDER — PREGABALIN 150 MG PO CAPS: ORAL_CAPSULE | ORAL | 1 refills | Status: DC

## 2020-12-24 ENCOUNTER — Ambulatory Visit: Payer: Medicare Other | Admitting: Neurology

## 2020-12-26 ENCOUNTER — Other Ambulatory Visit: Payer: Self-pay | Admitting: Cardiology

## 2020-12-26 MED ORDER — ROSUVASTATIN CALCIUM 10 MG PO TABS: 10.0000 mg | ORAL_TABLET | Freq: Every day | ORAL | 1 refills | Status: DC

## 2020-12-26 NOTE — Telephone Encounter (Signed)
*  STAT* If patient is at the pharmacy, call can be transferred to refill team.   1. Which medications need to be refilled? (please list name of each medication and dose if known)  rosuvastatin (CRESTOR) 10 MG tablet  2. Which pharmacy/location (including street and city if local pharmacy) is medication to be sent to?   3. Do they need a 30 day or 90 day supply? 90 with refills   [facility] called to ask for a renewal for this patient's medication   Reference # [id number]

## 2021-01-18 ENCOUNTER — Other Ambulatory Visit: Payer: Self-pay

## 2021-01-18 ENCOUNTER — Encounter: Payer: Self-pay | Admitting: Neurology

## 2021-01-18 ENCOUNTER — Ambulatory Visit (INDEPENDENT_AMBULATORY_CARE_PROVIDER_SITE_OTHER): Payer: Medicare Other | Admitting: Neurology

## 2021-01-18 VITALS — BP 165/84 | HR 85 | Ht 67.0 in | Wt 192.8 lb

## 2021-01-18 DIAGNOSIS — M4807 Spinal stenosis, lumbosacral region: Secondary | ICD-10-CM

## 2021-01-18 DIAGNOSIS — B0229 Other postherpetic nervous system involvement: Secondary | ICD-10-CM

## 2021-01-18 NOTE — Progress Notes (Signed)
[facility] Clinic Note - Initial Visit   Date: 01/18/21  Jonathon Johnson MRN: [id number] DOB: May 22, 1946   Dear Dr. Doreene Burke:  Thank you for your kind referral of Markian Michiels for consultation of post-herpetic neuralgia. Although his history is well known to you, please allow [name] to reiterate it for the purpose of our medical record. The patient was accompanied to the clinic by self.    History of Present Illness: Jonathon Johnson is a 75 y.o. male with GERD, hyperlipidemia, hypertension, and asthma presenting for evaluation of post-herpetic neuralgia.   In May 2022, he began having skin sensitivity over back, flank, and abdomen. Soon, he developed blisters over this area and was diagnosed with shingles.  He was treated with acyclovir. He was started on Lyrica 50mg  which has been increased to 150mg  TID, which has significantly helped the pain.  He occasionally has lower abdominal discomfort which is alleviated lidocaine patch.   Starting in November 2022, he began having leg spasms and difficulty with walking.  He does not need to take break when walking. MRI lumbar spine shows L4-5 severe spinal stenosis and moderate foraminal stenosis on the right at L3-4 and left L5-S1.  He has ESI which helped transiently.  He is seeing Dr. .  He has not completed PT. He denies leg weakness, numbness, tingling, or bowel/bladder dysfunction.   Out-side paper records, electronic medical record, and images have been reviewed where available and summarized as:  Lab Results  Component Value Date   TSH 1.462 12/08/2019   MRI lumbar spine 09/01/2020: 1. Advanced disc and facet degeneration with scoliosis. 2. L4-5 severe spinal stenosis. 3. Moderate foraminal stenosis on the right at L3-4 and left at L5-S1.     Past Medical History:  Diagnosis Date   Asthma    Chicken pox    GERD (gastroesophageal reflux disease)    Hyperlipidemia    Hypertension     Past  Surgical History:  Procedure Laterality Date   CHOLECYSTECTOMY       Medications:  Outpatient Encounter Medications as of 01/18/2021  Medication Sig   amitriptyline (ELAVIL) 25 MG tablet TAKE 1 TABLET AT BEDTIME   carvedilol (COREG) 12.5 MG tablet Take 1 tablet (12.5 mg total) by mouth 2 (two) times daily.   finasteride (PROSCAR) 5 MG tablet TAKE 1 TABLET DAILY   fluticasone (FLONASE) 50 MCG/ACT nasal spray USE 2 SPRAYS IN EACH NOSTRIL DAILY   Fluticasone-Salmeterol (ADVAIR) 250-50 MCG/DOSE AEPB Inhale 1 puff into the lungs daily.   ibuprofen (ADVIL) 600 MG tablet Take 600 mg by mouth daily.   losartan (COZAAR) 100 MG tablet TAKE 1 TABLET DAILY   Multiple Vitamin (MULTIVITAMIN) tablet Take 1 tablet by mouth daily.   omeprazole (PRILOSEC) 40 MG capsule TAKE 1 CAPSULE DAILY   pregabalin (LYRICA) 150 MG capsule Take one capsule every 8 hours.   rosuvastatin (CRESTOR) 10 MG tablet Take 1 tablet (10 mg total) by mouth daily.   tamsulosin (FLOMAX) 0.4 MG CAPS capsule TAKE 2 CAPSULES DAILY AFTER BREAKFAST   COVID-19 mRNA vaccine, Pfizer, 30 MCG/0.3ML injection Inject into the muscle. (Patient not taking: Reported on 01/18/2021)   lidocaine (LIDODERM) 5 % Place 1 patch onto the skin daily. Remove & Discard patch within 12 hours or as directed by MD (Patient not taking: Reported on 01/18/2021)   [DISCONTINUED] acyclovir (ZOVIRAX) 400 MG tablet Take 1 tablet (400 mg total) by mouth 3 (three) times daily.   No facility-administered encounter medications on file  as of 01/18/2021.    Allergies:  Allergies  Allergen Reactions   Amoxicillin Diarrhea   Morphine And Related Nausea And Vomiting    Family History: Family History  Problem Relation Age of Onset   Alcohol abuse Mother    Depression Mother    Diabetes Mother    Alcohol abuse Father    COPD Father    Cancer Sister    Hypertension Brother    Migraines Daughter    Multiple sclerosis Daughter     Social History: Social History    Tobacco Use   Smoking status: Former    Types: Cigarettes    Quit date: 07/19/2010    Years since quitting: 10.5   Smokeless tobacco: Never  Substance Use Topics   Alcohol use: Yes    Alcohol/week: 14.0 standard drinks    Types: 14 Glasses of wine per week    Comment: 3-4 glasses of wine daily   Drug use: Never   Social History   Social History Narrative   Right handed    Vital Signs:  BP (!) 165/84   Pulse 85   Ht 5\' 7"  (1.702 m)   Wt 192 lb 12.8 oz (87.5 kg)   SpO2 97%   BMI 30.20 kg/m    Neurological Exam: MENTAL STATUS including orientation to time, place, person, recent and remote memory, attention span and concentration, language, and fund of knowledge is normal.  Speech is not dysarthric.  CRANIAL NERVES: II:  No visual field defects.   III-IV-VI: Pupils equal round and reactive to light.  Normal conjugate, extra-ocular eye movements in all directions of gaze.  No nystagmus.  No ptosis.   V:  Normal facial sensation.    VII:  Normal facial symmetry and movements.   VIII:  Normal hearing and vestibular function.   IX-X:  Normal palatal movement.   XI:  Normal shoulder shrug and head rotation.   XII:  Normal tongue strength and range of motion, no deviation or fasciculation.  MOTOR:  No atrophy, fasciculations or abnormal movements.  No pronator drift.   Upper Extremity:  Right  Left  Deltoid  5/5   5/5   Biceps  5/5   5/5   Triceps  5/5   5/5   Infraspinatus 5/5  5/5  Medial pectoralis 5/5  5/5  Wrist extensors  5/5   5/5   Wrist flexors  5/5   5/5   Finger extensors  5/5   5/5   Finger flexors  5/5   5/5   Dorsal interossei  5/5   5/5   Abductor pollicis  5/5   5/5   Tone (Ashworth scale)  0  0   Lower Extremity:  Right  Left  Hip flexors  5/5   5/5   Hip extensors  5/5   5/5   Adductor 5/5  5/5  Abductor 5/5  5/5  Knee flexors  5/5   5/5   Knee extensors  5/5   5/5   Dorsiflexors  5/5   5/5   Plantarflexors  5/5   5/5   Toe extensors  5/5    5/5   Toe flexors  5/5   5/5   Tone (Ashworth scale)  0  0   MSRs:  Right        Left                  brachioradialis 2+  2+  biceps 2+  2+  triceps 2+  2+  patellar 3+  3+  ankle jerk 2+  2+  Hoffman no  no  plantar response up  down   SENSORY:  Normal and symmetric perception of light touch, pinprick, vibration, and proprioception in the hand and feet.  He has hypersensitivity over the left T9-10 dermatome to pin prick and temperature.    COORDINATION/GAIT: Normal finger-to- nose-finger.  Intact rapid alternating movements bilaterally.   Gait narrow based and stable.   IMPRESSION: Post-herpetic neuralgia  - Pain is stable on Lyrica 150mg  TID, I suggest that may want to try to reduce to BID to stay on a lower dose, since pain is better controlled now.  If pain gets worse, he may go back to taking 150mg  TID Lumbar canal stenosis at L4-5, severe, without neurogenic claudication  - Start PT for low back strengthening  - If symptoms get worse, low threshold to refer to Neurosurgery and Spine for their opinion   Thank you for allowing me to participate in patient's care.  If I can answer any additional questions, I would be pleased to do so.    Sincerely,    Roisin Mones K. , DO

## 2021-01-18 NOTE — Patient Instructions (Addendum)
Start physical therapy  You may try to reduce Lyrica to 150mg  twice daily, If your pain gets worse, go back to taking Lyrica 150mg  three times daily Please send my a MyChart message to let me know how you are doing.

## 2021-01-21 ENCOUNTER — Encounter: Payer: Self-pay | Admitting: Family Medicine

## 2021-01-22 ENCOUNTER — Other Ambulatory Visit: Payer: Self-pay

## 2021-01-22 DIAGNOSIS — G8929 Other chronic pain: Secondary | ICD-10-CM

## 2021-01-23 DIAGNOSIS — M79604 Pain in right leg: Secondary | ICD-10-CM | POA: Diagnosis not present

## 2021-01-23 DIAGNOSIS — M5459 Other low back pain: Secondary | ICD-10-CM | POA: Diagnosis not present

## 2021-01-23 DIAGNOSIS — M79605 Pain in left leg: Secondary | ICD-10-CM | POA: Diagnosis not present

## 2021-01-29 DIAGNOSIS — M4807 Spinal stenosis, lumbosacral region: Secondary | ICD-10-CM | POA: Diagnosis not present

## 2021-01-31 DIAGNOSIS — M79605 Pain in left leg: Secondary | ICD-10-CM | POA: Diagnosis not present

## 2021-01-31 DIAGNOSIS — M79604 Pain in right leg: Secondary | ICD-10-CM | POA: Diagnosis not present

## 2021-01-31 DIAGNOSIS — M5459 Other low back pain: Secondary | ICD-10-CM | POA: Diagnosis not present

## 2021-02-03 ENCOUNTER — Encounter: Payer: Self-pay | Admitting: Family Medicine

## 2021-02-03 DIAGNOSIS — G8929 Other chronic pain: Secondary | ICD-10-CM

## 2021-02-05 DIAGNOSIS — M79604 Pain in right leg: Secondary | ICD-10-CM | POA: Diagnosis not present

## 2021-02-05 DIAGNOSIS — M5459 Other low back pain: Secondary | ICD-10-CM | POA: Diagnosis not present

## 2021-02-05 DIAGNOSIS — M79605 Pain in left leg: Secondary | ICD-10-CM | POA: Diagnosis not present

## 2021-02-07 ENCOUNTER — Telehealth: Payer: Self-pay

## 2021-02-07 NOTE — Telephone Encounter (Signed)
New message   Office notes & Referral fax to [facility]  Office $ [phone]  Fax # [phone]  

## 2021-02-08 ENCOUNTER — Ambulatory Visit
Admission: RE | Admit: 2021-02-08 | Discharge: 2021-02-08 | Disposition: A | Discharge status: Home / Self Care | Payer: Medicare Other | Source: Ambulatory Visit | Attending: Family Medicine | Admitting: Family Medicine

## 2021-02-08 ENCOUNTER — Other Ambulatory Visit: Payer: Self-pay

## 2021-02-08 DIAGNOSIS — M48061 Spinal stenosis, lumbar region without neurogenic claudication: Secondary | ICD-10-CM | POA: Diagnosis not present

## 2021-02-08 DIAGNOSIS — M47817 Spondylosis without myelopathy or radiculopathy, lumbosacral region: Secondary | ICD-10-CM | POA: Diagnosis not present

## 2021-02-08 DIAGNOSIS — M5441 Lumbago with sciatica, right side: Secondary | ICD-10-CM

## 2021-02-08 DIAGNOSIS — G8929 Other chronic pain: Secondary | ICD-10-CM

## 2021-02-08 MED ORDER — METHYLPREDNISOLONE ACETATE 40 MG/ML INJ SUSP (RADIOLOG
80.0000 mg | Freq: Once | INTRAMUSCULAR | Status: AC
  Administered 2021-02-08: 80 mg via EPIDURAL

## 2021-02-08 MED ORDER — IOPAMIDOL (ISOVUE-M 200) INJECTION 41%
1.0000 mL | Freq: Once | INTRAMUSCULAR | Status: AC
  Administered 2021-02-08: 1 mL via EPIDURAL

## 2021-02-08 NOTE — Discharge Instructions (Signed)

## 2021-02-14 DIAGNOSIS — M5459 Other low back pain: Secondary | ICD-10-CM | POA: Diagnosis not present

## 2021-02-14 DIAGNOSIS — M79605 Pain in left leg: Secondary | ICD-10-CM | POA: Diagnosis not present

## 2021-02-14 DIAGNOSIS — M79604 Pain in right leg: Secondary | ICD-10-CM | POA: Diagnosis not present

## 2021-02-22 ENCOUNTER — Encounter: Payer: Self-pay | Admitting: Family Medicine

## 2021-02-26 ENCOUNTER — Other Ambulatory Visit: Payer: Self-pay | Admitting: Cardiology

## 2021-03-07 ENCOUNTER — Ambulatory Visit: Payer: Medicare Other | Attending: Internal Medicine

## 2021-03-07 DIAGNOSIS — M79604 Pain in right leg: Secondary | ICD-10-CM | POA: Diagnosis not present

## 2021-03-07 DIAGNOSIS — Z23 Encounter for immunization: Secondary | ICD-10-CM

## 2021-03-07 DIAGNOSIS — M79605 Pain in left leg: Secondary | ICD-10-CM | POA: Diagnosis not present

## 2021-03-07 DIAGNOSIS — M5459 Other low back pain: Secondary | ICD-10-CM | POA: Diagnosis not present

## 2021-03-14 DIAGNOSIS — M79605 Pain in left leg: Secondary | ICD-10-CM | POA: Diagnosis not present

## 2021-03-14 DIAGNOSIS — M79604 Pain in right leg: Secondary | ICD-10-CM | POA: Diagnosis not present

## 2021-03-14 DIAGNOSIS — M5459 Other low back pain: Secondary | ICD-10-CM | POA: Diagnosis not present

## 2021-03-15 ENCOUNTER — Other Ambulatory Visit (HOSPITAL_BASED_OUTPATIENT_CLINIC_OR_DEPARTMENT_OTHER): Payer: Self-pay

## 2021-03-15 ENCOUNTER — Other Ambulatory Visit: Payer: Self-pay | Admitting: Family Medicine

## 2021-03-15 DIAGNOSIS — K219 Gastro-esophageal reflux disease without esophagitis: Secondary | ICD-10-CM

## 2021-03-15 DIAGNOSIS — R059 Cough, unspecified: Secondary | ICD-10-CM

## 2021-03-15 MED ORDER — INFLUENZA VAC A&B SA ADJ QUAD 0.5 ML IM PRSY
PREFILLED_SYRINGE | INTRAMUSCULAR | 0 refills | Status: DC
  Filled 2021-03-15: qty 0.5, 1d supply, fill #0

## 2021-03-19 ENCOUNTER — Other Ambulatory Visit (HOSPITAL_BASED_OUTPATIENT_CLINIC_OR_DEPARTMENT_OTHER): Payer: Self-pay

## 2021-03-19 DIAGNOSIS — Z23 Encounter for immunization: Secondary | ICD-10-CM | POA: Diagnosis not present

## 2021-03-19 MED ORDER — COVID-19MRNA BIVAL VACC PFIZER 30 MCG/0.3ML IM SUSP
INTRAMUSCULAR | 0 refills | Status: DC
Start: 2021-03-07 — End: 2021-07-31
  Filled 2021-03-19: qty 0.3, 1d supply, fill #0

## 2021-03-21 DIAGNOSIS — M79605 Pain in left leg: Secondary | ICD-10-CM | POA: Diagnosis not present

## 2021-03-21 DIAGNOSIS — M79604 Pain in right leg: Secondary | ICD-10-CM | POA: Diagnosis not present

## 2021-03-21 DIAGNOSIS — M5459 Other low back pain: Secondary | ICD-10-CM | POA: Diagnosis not present

## 2021-04-08 ENCOUNTER — Other Ambulatory Visit (HOSPITAL_BASED_OUTPATIENT_CLINIC_OR_DEPARTMENT_OTHER): Payer: Self-pay

## 2021-04-18 ENCOUNTER — Other Ambulatory Visit: Payer: Self-pay

## 2021-04-18 ENCOUNTER — Encounter: Payer: Self-pay | Admitting: Cardiology

## 2021-04-18 ENCOUNTER — Ambulatory Visit (INDEPENDENT_AMBULATORY_CARE_PROVIDER_SITE_OTHER): Payer: Medicare Other | Admitting: Cardiology

## 2021-04-18 ENCOUNTER — Other Ambulatory Visit: Payer: Self-pay | Admitting: Cardiology

## 2021-04-18 VITALS — BP 130/78 | HR 87 | Resp 20 | Ht 67.0 in | Wt 190.6 lb

## 2021-04-18 DIAGNOSIS — R002 Palpitations: Secondary | ICD-10-CM | POA: Diagnosis not present

## 2021-04-18 DIAGNOSIS — E785 Hyperlipidemia, unspecified: Secondary | ICD-10-CM

## 2021-04-18 DIAGNOSIS — Z136 Encounter for screening for cardiovascular disorders: Secondary | ICD-10-CM

## 2021-04-18 DIAGNOSIS — I10 Essential (primary) hypertension: Secondary | ICD-10-CM

## 2021-04-18 DIAGNOSIS — Z9189 Other specified personal risk factors, not elsewhere classified: Secondary | ICD-10-CM

## 2021-04-18 NOTE — Progress Notes (Signed)
Cardiology Office Note:    Date:  04/19/2021   ID:  Jonathon Johnson, DOB 1945-09-23, MRN [id number]  PCP:  Mliss Sax, MD  Cardiologist:  None  Electrophysiologist:  None   Referring MD: Mliss Sax,*   Chief Complaint  Patient presents with   Palpitations     History of Present Illness:    Jonathon Johnson is a 75 y.o. male with a hx of asthma, hypertension, hyperlipidemia, GERD who presents for follow-up. He was referred by Dr. Doreene Burke for evaluation of tachycardia, initially seen on 12/15/2019. He was seen in the ED on 12/07/2019 for tachycardia.  He reported having palpitations and feeling like his heart was racing.  Checked his pulse and it was in the 130s.  In the ED, pulse remained in 120s, sinus tachycardia.  Lab work showed normal electrolytes,TSH, and negative troponin x2.  Age-adjusted D-dimer was negative.  He was given IV fluids, p.o. Ativan and IV metoprolol.  Heart rate improved to 80s to 90s.  He reports that he has palpitations most nights where he feels like his heart is racing.  Typically last for at least 30 minutes.  The episode that brought him to the ER was increased severity and duration of symptoms.  He has had lightheadedness during these episodes but denies any syncope.  States that he has been taking beta-blocker for around 30 years.  He exercises by going for walks every morning for about 25 minutes.  He denies any chest pain or shortness of breath outside of tachycardia episodes.  No lower extremity edema.  States that he smoked 1 pack/day x 50 years, but quit in 2012.  Drinks 3.5 to 5 glasses of wine per night.  No family history of heart disease.  Echocardiogram on 01/04/2020 showed normal biventricular function, no significant valvular disease. Zio patch x7 days on 01/12/2020 showed 23 episodes of SVT, longest lasting 17 beats. Patient triggered events corresponded to sinus rhythm  PACs/PVCs.  Since last clinic visit, he reports that he has been  doing well.  He works out for 1 hour at [employer] 3 times per week.  Denies any exertional chest pain or dyspnea.  Reports palpitations have improved but had episode where he felt like heart was skipping beats at the gym earlier this week.  Lasted for about 1 minute or so.  Also had an episode where he felt faint when he was bending over.  No syncope.  Reports SBP has been 100s to 130s when he checks at home.  BP Readings from Last 3 Encounters:  04/18/21 130/78  02/08/21 (!) 155/73  01/18/21 (!) 165/84     Past Medical History:  Diagnosis Date   Asthma    Chicken pox    GERD (gastroesophageal reflux disease)    Hyperlipidemia    Hypertension     Past Surgical History:  Procedure Laterality Date   CHOLECYSTECTOMY      Current Medications: Current Meds  Medication Sig   amitriptyline (ELAVIL) 25 MG tablet TAKE 1 TABLET AT BEDTIME   carvedilol (COREG) 12.5 MG tablet Take 1 tablet (12.5 mg total) by mouth 2 (two) times daily with a meal. KEEP OV.   COVID-19 mRNA bivalent vaccine, Pfizer, injection Inject into the muscle.   COVID-19 mRNA vaccine, Pfizer, 30 MCG/0.3ML injection Inject into the muscle.   finasteride (PROSCAR) 5 MG tablet TAKE 1 TABLET DAILY   fluticasone (FLONASE) 50 MCG/ACT nasal spray USE 2 SPRAYS IN EACH NOSTRIL DAILY   Fluticasone-Salmeterol (ADVAIR)  250-50 MCG/DOSE AEPB Inhale 1 puff into the lungs daily.   ibuprofen (ADVIL) 600 MG tablet Take 600 mg by mouth daily.   influenza vaccine adjuvanted (FLUAD) 0.5 ML injection Inject into the muscle.   losartan (COZAAR) 100 MG tablet TAKE 1 TABLET DAILY   Multiple Vitamin (MULTIVITAMIN) tablet Take 1 tablet by mouth daily.   omeprazole (PRILOSEC) 40 MG capsule TAKE 1 CAPSULE DAILY   rosuvastatin (CRESTOR) 10 MG tablet Take 1 tablet (10 mg total) by mouth daily.   tamsulosin (FLOMAX) 0.4 MG CAPS capsule TAKE 2 CAPSULES DAILY AFTER BREAKFAST     Allergies:   Amoxicillin and Morphine and related   Social History    Socioeconomic History   Marital status: Married    Spouse name: Not on file   Number of children: Not on file   Years of education: Not on file   Highest education level: Not on file  Occupational History   Not on file  Tobacco Use   Smoking status: Former    Types: Cigarettes    Quit date: 07/19/2010    Years since quitting: 10.7   Smokeless tobacco: Never  Substance and Sexual Activity   Alcohol use: Yes    Alcohol/week: 14.0 standard drinks    Types: 14 Glasses of wine per week    Comment: 3-4 glasses of wine daily   Drug use: Never   Sexual activity: Yes    Partners: Female  Other Topics Concern   Not on file  Social History Narrative   Right handed   Social Determinants of Health   Financial Resource Strain: Low Risk    Difficulty of Paying Living Expenses: Not hard at all  Food Insecurity: No Food Insecurity   Worried About Programme researcher, broadcasting/film/video in the Last Year: Never true   Ran Out of Food in the Last Year: Never true  Transportation Needs: No Transportation Needs   Lack of Transportation (Medical): No   Lack of Transportation (Non-Medical): No  Physical Activity: Sufficiently Active   Days of Exercise per Week: 3 days   Minutes of Exercise per Session: 60 min  Stress: No Stress Concern Present   Feeling of Stress : Not at all  Social Connections: Moderately Integrated   Frequency of Communication with Friends and Family: More than three times a week   Frequency of Social Gatherings with Friends and Family: More than three times a week   Attends Religious Services: 1 to 4 times per year   Active Member of [employer] or Organizations: No   Attends Engineer, structural: Never   Marital Status: Married     Family History: The patient's family history includes Alcohol abuse in his father and mother; COPD in his father; Cancer in his sister; Depression in his mother; Diabetes in his mother; Hypertension in his brother; Migraines in his daughter; Multiple  sclerosis in his daughter.  ROS:   Please see the history of present illness.     All other systems reviewed and are negative.  EKGs/Labs/Other Studies Reviewed:    The following studies were reviewed today:   EKG:   04/18/21: Normal sinus rhythm, rate 87, no ST abnormality    Recent Labs: 09/28/2020: ALT 33; BUN 17; Creatinine, Ser 0.83; Hemoglobin 14.4; Platelets 272.0; Potassium 4.4; Sodium 139  Recent Lipid Panel    Component Value Date/Time   CHOL 185 09/28/2020 0806   TRIG 112.0 09/28/2020 0806   HDL 72.00 09/28/2020 0806   CHOLHDL 3 09/28/2020  0806   VLDL 22.4 09/28/2020 0806   LDLCALC 91 09/28/2020 0806   LDLDIRECT 112.0 09/28/2020 0806    Physical Exam:    VS:  BP 130/78 (BP Location: Left Arm, Patient Position: Sitting, Cuff Size: Normal)   Pulse 87   Resp 20   Ht 5\' 7"  (1.702 m)   Wt 190 lb 9.6 oz (86.5 kg)   SpO2 95%   BMI 29.85 kg/m     Wt Readings from Last 3 Encounters:  04/18/21 190 lb 9.6 oz (86.5 kg)  01/18/21 192 lb 12.8 oz (87.5 kg)  12/04/20 192 lb (87.1 kg)     GEN:  in no acute distress HEENT: Normal NECK: No JVD; No carotid bruits CARDIAC: RRR, no murmurs, rubs, gallops RESPIRATORY:  Clear to auscultation without rales, wheezing or rhonchi  ABDOMEN: Soft, non-tender, non-distended MUSCULOSKELETAL:  No edema; No deformity  SKIN: Warm and dry NEUROLOGIC:  Alert and oriented x 3 PSYCHIATRIC:  Normal affect   ASSESSMENT:    1. Essential hypertension   2. Hyperlipidemia, unspecified hyperlipidemia type   3. Palpitations     PLAN:    Tachycardia/palpitations/lightheadedness: Description concerning for arrhythmia.  Echocardiogram on 01/04/2020 showed normal biventricular function, no significant valvular disease. Zio patch x7 days on 01/12/2020 showed 23 episodes of SVT, longest lasting 17 beats. Patient triggered events corresponded to sinus rhythm  PACs/PVCs.  Suspect alcohol use (3.5 to 5 glasses of wine per night) could be  contributing, recommended cutting back on alcohol intake.  He denies any history of withdrawal symptoms.  Continue carvedilol 12.5 mg twice daily.  Reports palpitations have improved.  Hypertension: On losartan 100 mg daily and carvedilol 12.5 mg twice daily.  Appears controlled  Hyperlipidemia: 10-year ASCVD risk score 21%. LDL 108 on 11/29/2019.  Started rosuvastatin 10 mg daily, LDL 91 on 09/28/2020.  We will check calcium score to guide her aggressively lowering cholesterol  RTC in 1 year  Medication Adjustments/Labs and Tests Ordered: Current medicines are reviewed at length with the patient today.  Concerns regarding medicines are outlined above.  Orders Placed This Encounter  Procedures   CT CARDIAC SCORING (SELF PAY ONLY)   EKG 12-Lead    No orders of the defined types were placed in this encounter.   Patient Instructions  Medication Instructions:  Your physician recommends that you continue on your current medications as directed. Please refer to the Current Medication list given to you today.  *If you need a refill on your cardiac medications before your next appointment, please call your pharmacy*  Testing/Procedures: CT coronary calcium score. This test is done at [address] [address]. This is $99 out of pocket.   Coronary CalciumScan A coronary calcium scan is an imaging test used to look for deposits of calcium and other fatty materials (plaques) in the inner lining of the blood vessels of the heart (coronary arteries). These deposits of calcium and plaques can partly clog and narrow the coronary arteries without producing any symptoms or warning signs. This puts a person at risk for a heart attack. This test can detect these deposits before symptoms develop. Tell a health care provider about: Any allergies you have. All medicines you are taking, including vitamins, herbs, eye drops, creams, and over-the-counter medicines. Any problems you or family members  have had with anesthetic medicines. Any blood disorders you have. Any surgeries you have had. Any medical conditions you have. Whether you are pregnant or may be pregnant. What are the risks?  Generally, this is a safe procedure. However, problems may occur, including: Harm to a pregnant woman and her unborn baby. This test involves the use of radiation. Radiation exposure can be dangerous to a pregnant woman and her unborn baby. If you are pregnant, you generally should not have this procedure done. Slight increase in the risk of cancer. This is because of the radiation involved in the test. What happens before the procedure? No preparation is needed for this procedure. What happens during the procedure? You will undress and remove any jewelry around your neck or chest. You will put on a hospital gown. Sticky electrodes will be placed on your chest. The electrodes will be connected to an electrocardiogram (ECG) machine to record a tracing of the electrical activity of your heart. A CT scanner will take pictures of your heart. During this time, you will be asked to lie still and hold your breath for 2-3 seconds while a picture of your heart is being taken. The procedure may vary among health care providers and hospitals. What happens after the procedure? You can get dressed. You can return to your normal activities. It is up to you to get the results of your test. Ask your health care provider, or the department that is doing the test, when your results will be ready. Summary A coronary calcium scan is an imaging test used to look for deposits of calcium and other fatty materials (plaques) in the inner lining of the blood vessels of the heart (coronary arteries). Generally, this is a safe procedure. Tell your health care provider if you are pregnant or may be pregnant. No preparation is needed for this procedure. A CT scanner will take pictures of your heart. You can return to your normal  activities after the scan is done. This information is not intended to replace advice given to you by your health care provider. Make sure you discuss any questions you have with your health care provider. Document Released: 11/22/2007 Document Revised: 04/14/2016 Document Reviewed: 04/14/2016 Elsevier Interactive Patient Education  2017 [employer].  Follow-Up: At [facility], you and your health needs are our priority.  As part of our continuing mission to provide you with exceptional heart care, we have created designated Provider Care Teams.  These Care Teams include your primary Cardiologist (physician) and Advanced Practice Providers (APPs -  Physician Assistants and Nurse Practitioners) who all work together to provide you with the care you need, when you need it.  We recommend signing up for the patient portal called "MyChart".  Sign up information is provided on this After Visit Summary.  MyChart is used to connect with patients for Virtual Visits (Telemedicine).  Patients are able to view lab/test results, encounter notes, upcoming appointments, etc.  Non-urgent messages can be sent to your provider as well.   To learn more about what you can do with MyChart, go to [email or web address].    Your next appointment:   12 month(s)  The format for your next appointment:   In Person  Provider:   Dr. Bjorn Pippin   Signed, Little Ishikawa, MD  04/19/2021 8:45 AM    [facility]

## 2021-04-18 NOTE — Patient Instructions (Signed)
Medication Instructions:  Your physician recommends that you continue on your current medications as directed. Please refer to the Current Medication list given to you today.  *If you need a refill on your cardiac medications before your next appointment, please call your pharmacy*  Testing/Procedures: CT coronary calcium score. This test is done at [address]. This is $99 out of pocket.   Coronary CalciumScan A coronary calcium scan is an imaging test used to look for deposits of calcium and other fatty materials (plaques) in the inner lining of the blood vessels of the heart (coronary arteries). These deposits of calcium and plaques can partly clog and narrow the coronary arteries without producing any symptoms or warning signs. This puts a person at risk for a heart attack. This test can detect these deposits before symptoms develop. Tell a health care provider about: Any allergies you have. All medicines you are taking, including vitamins, herbs, eye drops, creams, and over-the-counter medicines. Any problems you or family members have had with anesthetic medicines. Any blood disorders you have. Any surgeries you have had. Any medical conditions you have. Whether you are pregnant or may be pregnant. What are the risks? Generally, this is a safe procedure. However, problems may occur, including: Harm to a pregnant woman and her unborn baby. This test involves the use of radiation. Radiation exposure can be dangerous to a pregnant woman and her unborn baby. If you are pregnant, you generally should not have this procedure done. Slight increase in the risk of cancer. This is because of the radiation involved in the test. What happens before the procedure? No preparation is needed for this procedure. What happens during the procedure? You will undress and remove any jewelry around your neck or chest. You will put on a hospital gown. Sticky electrodes will be placed on  your chest. The electrodes will be connected to an electrocardiogram (ECG) machine to record a tracing of the electrical activity of your heart. A CT scanner will take pictures of your heart. During this time, you will be asked to lie still and hold your breath for 2-3 seconds while a picture of your heart is being taken. The procedure may vary among health care providers and hospitals. What happens after the procedure? You can get dressed. You can return to your normal activities. It is up to you to get the results of your test. Ask your health care provider, or the department that is doing the test, when your results will be ready. Summary A coronary calcium scan is an imaging test used to look for deposits of calcium and other fatty materials (plaques) in the inner lining of the blood vessels of the heart (coronary arteries). Generally, this is a safe procedure. Tell your health care provider if you are pregnant or may be pregnant. No preparation is needed for this procedure. A CT scanner will take pictures of your heart. You can return to your normal activities after the scan is done. This information is not intended to replace advice given to you by your health care provider. Make sure you discuss any questions you have with your health care provider. Document Released: 11/22/2007 Document Revised: 04/14/2016 Document Reviewed: 04/14/2016 Elsevier Interactive Patient Education  2017 [place]: At [facility], you and your health needs are our priority.  As part of our continuing mission to provide you with exceptional heart care, we have created designated Provider Care Teams.  These Care Teams include your  primary Cardiologist (physician) and Advanced Practice Providers (APPs -  Physician Assistants and Nurse Practitioners) who all work together to provide you with the care you need, when you need it.  We recommend signing up for the patient portal called "MyChart".  Sign  up information is provided on this After Visit Summary.  MyChart is used to connect with patients for Virtual Visits (Telemedicine).  Patients are able to view lab/test results, encounter notes, upcoming appointments, etc.  Non-urgent messages can be sent to your provider as well.   To learn more about what you can do with MyChart, go to [email or web address].    Your next appointment:   12 month(s)  The format for your next appointment:   In Person  Provider:   Dr. Bjorn Pippin

## 2021-05-17 ENCOUNTER — Ambulatory Visit (INDEPENDENT_AMBULATORY_CARE_PROVIDER_SITE_OTHER)
Admission: RE | Admit: 2021-05-17 | Discharge: 2021-05-17 | Disposition: A | Discharge status: Home / Self Care | Payer: Self-pay | Source: Ambulatory Visit | Attending: Cardiology | Admitting: Cardiology

## 2021-05-17 ENCOUNTER — Other Ambulatory Visit: Payer: Self-pay

## 2021-05-17 DIAGNOSIS — E785 Hyperlipidemia, unspecified: Secondary | ICD-10-CM

## 2021-05-17 DIAGNOSIS — I10 Essential (primary) hypertension: Secondary | ICD-10-CM

## 2021-05-20 ENCOUNTER — Telehealth: Payer: Self-pay | Admitting: Cardiology

## 2021-05-20 MED ORDER — ROSUVASTATIN CALCIUM 20 MG PO TABS: 20.0000 mg | ORAL_TABLET | Freq: Every day | ORAL | 3 refills | Status: DC

## 2021-05-20 NOTE — Telephone Encounter (Signed)
Pt updated with lab results and verbalized understanding. New Rx sent to the requested pharmacy.     Little Ishikawa, MD  05/19/2021  4:08 PM EST     Significant amount of plaque in heart arteries (81st percentile), recommend increasing rosuvastatin to 20 mg daily

## 2021-05-20 NOTE — Telephone Encounter (Signed)
Patient is returning call to discuss CT results. 

## 2021-05-23 ENCOUNTER — Other Ambulatory Visit: Payer: Self-pay | Admitting: Cardiology

## 2021-05-26 ENCOUNTER — Other Ambulatory Visit: Payer: Self-pay | Admitting: Family Medicine

## 2021-05-26 DIAGNOSIS — K219 Gastro-esophageal reflux disease without esophagitis: Secondary | ICD-10-CM

## 2021-05-26 DIAGNOSIS — R059 Cough, unspecified: Secondary | ICD-10-CM

## 2021-05-27 ENCOUNTER — Telehealth: Payer: Self-pay | Admitting: Family Medicine

## 2021-05-27 NOTE — Telephone Encounter (Signed)
Patient have not been seen in office since June 2022 there are no orders pending for patient. Called patient for a contact number to reach someone at [facility] no answer [email or web address]. Do you have a contact number?

## 2021-07-03 ENCOUNTER — Encounter: Payer: Self-pay | Admitting: Family Medicine

## 2021-07-10 ENCOUNTER — Other Ambulatory Visit: Payer: Self-pay | Admitting: Family

## 2021-07-10 ENCOUNTER — Ambulatory Visit: Payer: Medicare Other

## 2021-07-10 DIAGNOSIS — N401 Enlarged prostate with lower urinary tract symptoms: Secondary | ICD-10-CM

## 2021-07-10 DIAGNOSIS — R3911 Hesitancy of micturition: Secondary | ICD-10-CM

## 2021-07-16 ENCOUNTER — Ambulatory Visit: Payer: Medicare Other

## 2021-07-23 ENCOUNTER — Encounter: Payer: Self-pay | Admitting: Family Medicine

## 2021-07-24 ENCOUNTER — Other Ambulatory Visit: Payer: Self-pay

## 2021-07-24 ENCOUNTER — Ambulatory Visit (INDEPENDENT_AMBULATORY_CARE_PROVIDER_SITE_OTHER): Payer: Medicare Other

## 2021-07-24 DIAGNOSIS — Z0000 Encounter for general adult medical examination without abnormal findings: Secondary | ICD-10-CM

## 2021-07-24 NOTE — Progress Notes (Signed)
Virtual Visit via Telephone Note  I connected with  Dontez Dobie on 07/24/21 at 12:45 PM EST by telephone and verified that I am speaking with the correct person using two identifiers.  Location: Patient: home Provider: [facility] Persons participating in the virtual visit: patient/Nurse Health Advisor   I discussed the limitations, risks, security and privacy concerns of performing an evaluation and management service by telephone and the availability of in person appointments. The patient expressed understanding and agreed to proceed.  Interactive audio and video telecommunications were attempted between this nurse and patient, however failed, due to patient having technical difficulties OR patient did not have access to video capability.  We continued and completed visit with audio only.  Some vital signs may be absent or patient reported.   Hal Hope, LPN  Subjective:   Colonel Krauser is a 76 y.o. male who presents for Medicare Annual/Subsequent preventive examination.  Review of Systems           Objective:    There were no vitals filed for this visit. There is no height or weight on file to calculate BMI.  Advanced Directives 07/10/2020 06/22/2019  Does Patient Have a Medical Advance Directive? No No  Does patient want to make changes to medical advance directive? - No - Patient declined  Would patient like information on creating a medical advance directive? No - Patient declined -    Current Medications (verified) Outpatient Encounter Medications as of 07/24/2021  Medication Sig   amitriptyline (ELAVIL) 25 MG tablet TAKE 1 TABLET AT BEDTIME   carvedilol (COREG) 12.5 MG tablet TAKE 1 TABLET TWICE A DAY WITH A MEAL (KEEP OFFICE VISIT)   COVID-19 mRNA bivalent vaccine, Pfizer, injection Inject into the muscle.   COVID-19 mRNA vaccine, Pfizer, 30 MCG/0.3ML injection Inject into the muscle.   finasteride (PROSCAR) 5 MG tablet TAKE 1 TABLET DAILY   fluticasone (FLONASE) 50  MCG/ACT nasal spray USE 2 SPRAYS IN EACH NOSTRIL DAILY   Fluticasone-Salmeterol (ADVAIR) 250-50 MCG/DOSE AEPB Inhale 1 puff into the lungs daily.   ibuprofen (ADVIL) 600 MG tablet Take 600 mg by mouth daily.   influenza vaccine adjuvanted (FLUAD) 0.5 ML injection Inject into the muscle.   losartan (COZAAR) 100 MG tablet TAKE 1 TABLET DAILY   Multiple Vitamin (MULTIVITAMIN) tablet Take 1 tablet by mouth daily.   omeprazole (PRILOSEC) 40 MG capsule TAKE 1 CAPSULE DAILY   predniSONE (DELTASONE) 20 MG tablet    predniSONE (STERAPRED UNI-PAK 48 TAB) 10 MG (48) TBPK tablet    pregabalin (LYRICA) 150 MG capsule    rosuvastatin (CRESTOR) 20 MG tablet Take 1 tablet (20 mg total) by mouth daily.   tamsulosin (FLOMAX) 0.4 MG CAPS capsule TAKE 2 CAPSULES DAILY AFTER BREAKFAST   No facility-administered encounter medications on file as of 07/24/2021.    Allergies (verified) Amoxicillin and Morphine and related   History: Past Medical History:  Diagnosis Date   Asthma    Chicken pox    GERD (gastroesophageal reflux disease)    Hyperlipidemia    Hypertension    Past Surgical History:  Procedure Laterality Date   CHOLECYSTECTOMY     Family History  Problem Relation Age of Onset   Alcohol abuse Mother    Depression Mother    Diabetes Mother    Alcohol abuse Father    COPD Father    Cancer Sister    Hypertension Brother    Migraines Daughter    Multiple sclerosis Daughter    Social  History   Socioeconomic History   Marital status: Married    Spouse name: Not on file   Number of children: Not on file   Years of education: Not on file   Highest education level: Not on file  Occupational History   Not on file  Tobacco Use   Smoking status: Former    Types: Cigarettes    Quit date: 07/19/2010    Years since quitting: 11.0   Smokeless tobacco: Never  Substance and Sexual Activity   Alcohol use: Yes    Alcohol/week: 14.0 standard drinks    Types: 14 Glasses of wine per week     Comment: 3-4 glasses of wine daily   Drug use: Never   Sexual activity: Yes    Partners: Female  Other Topics Concern   Not on file  Social History Narrative   Right handed   Social Determinants of Health   Financial Resource Strain: Not on file  Food Insecurity: Not on file  Transportation Needs: Not on file  Physical Activity: Not on file  Stress: Not on file  Social Connections: Not on file    Tobacco Counseling Counseling given: Not Answered   Clinical Intake:  Pre-visit preparation completed: Yes  Pain : No/denies pain     Nutritional Risks: None Diabetes: No  How often do you need to have someone help you when you read instructions, pamphlets, or other written materials from your doctor or pharmacy?: 1 - Never  Diabetic?no  Interpreter Needed?: No  Information entered by :: Kennedy Bucker, LPN   Activities of Daily Living No flowsheet data found.  Patient Care Team: Mliss Sax, MD as PCP - General (Family Medicine)  Indicate any recent Medical Services you may have received from other than [facility] providers in the past year (date may be approximate).     Assessment:   This is a routine wellness examination for Jonathon Johnson.  Hearing/Vision screen No results found.  Dietary issues and exercise activities discussed:     Goals Addressed   None    Depression Screen PHQ 2/9 Scores 11/08/2020 08/28/2020 07/10/2020 06/28/2019 06/22/2019  PHQ - 2 Score 0 0 0 0 0    Fall Risk Fall Risk  11/08/2020 08/28/2020 07/10/2020 11/29/2019 06/28/2019  Falls in the past year? 0 0 0 0 0  Number falls in past yr: - - 0 - -  Injury with Fall? - - 0 - -  Follow up - - Falls prevention discussed - -    FALL RISK PREVENTION PERTAINING TO THE HOME:  Any stairs in or around the home? No  If so, are there any without handrails? No  Home free of loose throw rugs in walkways, pet beds, electrical cords, etc? Yes  Adequate lighting in your home to reduce risk of falls? Yes    ASSISTIVE DEVICES UTILIZED TO PREVENT FALLS:  Life alert? No  Use of a cane, walker or w/c? No  Grab bars in the bathroom? Yes  Shower chair or bench in shower? No  Elevated toilet seat or a handicapped toilet? Yes   Cognitive Function:Normal cognitive status assessed by direct observation by this Nurse Health Advisor. No abnormalities found.       6CIT Screen 07/10/2020  What Year? 0 points  What month? 0 points  What time? 0 points  Count back from 20 0 points  Months in reverse 0 points  Repeat phrase 0 points  Total Score 0    Immunizations Immunization History  Administered Date(s) Administered   Fluad Quad(high Dose 65+) 02/09/2019   Influenza-Unspecified 03/11/2018, 02/15/2020   PFIZER Comirnaty(Gray Top)Covid-19 Tri-Sucrose Vaccine 09/18/2020   PFIZER(Purple Top)SARS-COV-2 Vaccination 07/24/2019, 08/16/2019, 03/12/2020   Pfizer Covid-19 Vaccine Bivalent Booster 3yrs & up 03/07/2021   Pneumococcal Conjugate-13 06/28/2019   Pneumococcal Polysaccharide-23 05/04/2012   Tdap 03/27/2009, 06/28/2019   Zoster, Live 07/16/2012    TDAP status: Up to date  Flu Vaccine status: Up to date  Pneumococcal vaccine status: Up to date  Covid-19 vaccine status: Completed vaccines  Qualifies for Shingles Vaccine? Yes   Zostavax completed No   Shingrix Completed?: No.    Education has been provided regarding the importance of this vaccine. Patient has been advised to call insurance company to determine out of pocket expense if they have not yet received this vaccine. Advised may also receive vaccine at local pharmacy or Health Dept. Verbalized acceptance and understanding.  Screening Tests Health Maintenance  Topic Date Due   Zoster Vaccines- Shingrix (1 of 2) Never done   INFLUENZA VACCINE  01/07/2021   COLONOSCOPY (Pts 45-66yrs Insurance coverage will need to be confirmed)  12/01/2022   TETANUS/TDAP  06/27/2029   Pneumonia Vaccine 76+ Years old  Completed   COVID-19  Vaccine  Completed   Hepatitis C Screening  Completed   HPV VACCINES  Aged Out    Health Maintenance  Health Maintenance Due  Topic Date Due   Zoster Vaccines- Shingrix (1 of 2) Never done   INFLUENZA VACCINE  01/07/2021    Colorectal cancer screening: Type of screening: Colonoscopy. Completed 11/30/12. Repeat every 10 years  Lung Cancer Screening: (Low Dose CT Chest recommended if Age 25-80 years, 30 pack-year currently smoking OR have quit w/in 15years.) does not qualify.    Additional Screening:  Hepatitis C Screening: does qualify; Completed 06/28/19  Vision Screening: Recommended annual ophthalmology exams for early detection of glaucoma and other disorders of the eye. Is the patient up to date with their annual eye exam?  Yes  Who is the provider or what is the name of the office in which the patient attends annual eye exams? Dr.Glenn If pt is not established with a provider, would they like to be referred to a provider to establish care? No .   Dental Screening: Recommended annual dental exams for proper oral hygiene  Community Resource Referral / Chronic Care Management: CRR required this visit?  No   CCM required this visit?  No      Plan:     I have personally reviewed and noted the following in the patients chart:   Medical and social history Use of alcohol, tobacco or illicit drugs  Current medications and supplements including opioid prescriptions. Patient is not currently taking opioid prescriptions. Functional ability and status Nutritional status Physical activity Advanced directives List of other physicians Hospitalizations, surgeries, and ER visits in previous 12 months Vitals Screenings to include cognitive, depression, and falls Referrals and appointments  In addition, I have reviewed and discussed with patient certain preventive protocols, quality metrics, and best practice recommendations. A written personalized care plan for preventive  services as well as general preventive health recommendations were provided to patient.     Hal Hope, LPN   2/58/5277   Nurse Notes: none

## 2021-07-24 NOTE — Patient Instructions (Signed)
Jonathon Johnson , Thank you for taking time to come for your Medicare Wellness Visit. I appreciate your ongoing commitment to your health goals. Please review the following plan we discussed and let me know if I can assist you in the future.   Screening recommendations/referrals: Colonoscopy: 11/30/12 Recommended yearly ophthalmology/optometry visit for glaucoma screening and checkup Recommended yearly dental visit for hygiene and checkup  Vaccinations: Influenza vaccine: had at local [facility] Pneumococcal vaccine: 06/28/19 Tdap vaccine: 06/28/19 Shingles vaccine: Zostavax 07/16/12   Covid-19: 07/24/19, 08/16/19, 03/12/20, 09/18/20, 03/07/21  Advanced directives: yes, request copy  Conditions/risks identified: none  Next appointment: Follow up in one year for your annual wellness visit. 07/29/22 @ 3pm by phone  Preventive Care 65 Years and Older, Male Preventive care refers to lifestyle choices and visits with your health care provider that can promote health and wellness. What does preventive care include? A yearly physical exam. This is also called an annual well check. Dental exams once or twice a year. Routine eye exams. Ask your health care provider how often you should have your eyes checked. Personal lifestyle choices, including: Daily care of your teeth and gums. Regular physical activity. Eating a healthy diet. Avoiding tobacco and drug use. Limiting alcohol use. Practicing safe sex. Taking low doses of aspirin every day. Taking vitamin and mineral supplements as recommended by your health care provider. What happens during an annual well check? The services and screenings done by your health care provider during your annual well check will depend on your age, overall health, lifestyle risk factors, and family history of disease. Counseling  Your health care provider may ask you questions about your: Alcohol use. Tobacco use. Drug use. Emotional well-being. Home and relationship  well-being. Sexual activity. Eating habits. History of falls. Memory and ability to understand (cognition). Work and work Astronomer. Screening  You may have the following tests or measurements: Height, weight, and BMI. Blood pressure. Lipid and cholesterol levels. These may be checked every 5 years, or more frequently if you are over 59 years old. Skin check. Lung cancer screening. You may have this screening every year starting at age 35 if you have a 30-pack-year history of smoking and currently smoke or have quit within the past 15 years. Fecal occult blood test (FOBT) of the stool. You may have this test every year starting at age 39. Flexible sigmoidoscopy or colonoscopy. You may have a sigmoidoscopy every 5 years or a colonoscopy every 10 years starting at age 69. Prostate cancer screening. Recommendations will vary depending on your family history and other risks. Hepatitis C blood test. Hepatitis B blood test. Sexually transmitted disease (STD) testing. Diabetes screening. This is done by checking your blood sugar (glucose) after you have not eaten for a while (fasting). You may have this done every 1-3 years. Abdominal aortic aneurysm (AAA) screening. You may need this if you are a current or former smoker. Osteoporosis. You may be screened starting at age 9 if you are at high risk. Talk with your health care provider about your test results, treatment options, and if necessary, the need for more tests. Vaccines  Your health care provider may recommend certain vaccines, such as: Influenza vaccine. This is recommended every year. Tetanus, diphtheria, and acellular pertussis (Tdap, Td) vaccine. You may need a Td booster every 10 years. Zoster vaccine. You may need this after age 41. Pneumococcal 13-valent conjugate (PCV13) vaccine. One dose is recommended after age 62. Pneumococcal polysaccharide (PPSV23) vaccine. One dose is recommended after  age 5. Talk to your health care  provider about which screenings and vaccines you need and how often you need them. This information is not intended to replace advice given to you by your health care provider. Make sure you discuss any questions you have with your health care provider. Document Released: 06/22/2015 Document Revised: 02/13/2016 Document Reviewed: 03/27/2015 Elsevier Interactive Patient Education  2017 [place] Johnson Prevention in the Home Falls can cause injuries. They can happen to people of all ages. There are many things you can do to make your home safe and to help prevent falls. What can I do on the outside of my home? Regularly fix the edges of walkways and driveways and fix any cracks. Remove anything that might make you trip as you walk through a door, such as a raised step or threshold. Trim any bushes or trees on the path to your home. Use bright outdoor lighting. Clear any walking paths of anything that might make someone trip, such as rocks or tools. Regularly check to see if handrails are loose or broken. Make sure that both sides of any steps have handrails. Any raised decks and porches should have guardrails on the edges. Have any leaves, snow, or ice cleared regularly. Use sand or salt on walking paths during winter. Clean up any spills in your garage right away. This includes oil or grease spills. What can I do in the bathroom? Use night lights. Install grab bars by the toilet and in the tub and shower. Do not use towel bars as grab bars. Use non-skid mats or decals in the tub or shower. If you need to sit down in the shower, use a plastic, non-slip stool. Keep the floor dry. Clean up any water that spills on the floor as soon as it happens. Remove soap buildup in the tub or shower regularly. Attach bath mats securely with double-sided non-slip rug tape. Do not have throw rugs and other things on the floor that can make you trip. What can I do in the bedroom? Use night lights. Make  sure that you have a light by your bed that is easy to reach. Do not use any sheets or blankets that are too big for your bed. They should not hang down onto the floor. Have a firm chair that has side arms. You can use this for support while you get dressed. Do not have throw rugs and other things on the floor that can make you trip. What can I do in the kitchen? Clean up any spills right away. Avoid walking on wet floors. Keep items that you use a lot in easy-to-reach places. If you need to reach something above you, use a strong step stool that has a grab bar. Keep electrical cords out of the way. Do not use floor polish or wax that makes floors slippery. If you must use wax, use non-skid floor wax. Do not have throw rugs and other things on the floor that can make you trip. What can I do with my stairs? Do not leave any items on the stairs. Make sure that there are handrails on both sides of the stairs and use them. Fix handrails that are broken or loose. Make sure that handrails are as long as the stairways. Check any carpeting to make sure that it is firmly attached to the stairs. Fix any carpet that is loose or worn. Avoid having throw rugs at the top or bottom of the stairs. If you do  have throw rugs, attach them to the floor with carpet tape. Make sure that you have a light switch at the top of the stairs and the bottom of the stairs. If you do not have them, ask someone to add them for you. What else can I do to help prevent falls? Wear shoes that: Do not have high heels. Have rubber bottoms. Are comfortable and fit you well. Are closed at the toe. Do not wear sandals. If you use a stepladder: Make sure that it is fully opened. Do not climb a closed stepladder. Make sure that both sides of the stepladder are locked into place. Ask someone to hold it for you, if possible. Clearly mark and make sure that you can see: Any grab bars or handrails. First and last steps. Where the  edge of each step is. Use tools that help you move around (mobility aids) if they are needed. These include: Canes. Walkers. Scooters. Crutches. Turn on the lights when you go into a dark area. Replace any light bulbs as soon as they burn out. Set up your furniture so you have a clear path. Avoid moving your furniture around. If any of your floors are uneven, fix them. If there are any pets around you, be aware of where they are. Review your medicines with your doctor. Some medicines can make you feel dizzy. This can increase your chance of falling. Ask your doctor what other things that you can do to help prevent falls. This information is not intended to replace advice given to you by your health care provider. Make sure you discuss any questions you have with your health care provider. Document Released: 03/22/2009 Document Revised: 11/01/2015 Document Reviewed: 06/30/2014 Elsevier Interactive Patient Education  2017 [employer].

## 2021-07-25 DIAGNOSIS — H35033 Hypertensive retinopathy, bilateral: Secondary | ICD-10-CM | POA: Diagnosis not present

## 2021-07-25 DIAGNOSIS — H18593 Other hereditary corneal dystrophies, bilateral: Secondary | ICD-10-CM | POA: Diagnosis not present

## 2021-07-25 DIAGNOSIS — H2513 Age-related nuclear cataract, bilateral: Secondary | ICD-10-CM | POA: Diagnosis not present

## 2021-07-26 DIAGNOSIS — Z20822 Contact with and (suspected) exposure to covid-19: Secondary | ICD-10-CM | POA: Diagnosis not present

## 2021-07-30 ENCOUNTER — Other Ambulatory Visit: Payer: Self-pay

## 2021-07-31 ENCOUNTER — Encounter: Payer: Self-pay | Admitting: Family Medicine

## 2021-07-31 ENCOUNTER — Ambulatory Visit (INDEPENDENT_AMBULATORY_CARE_PROVIDER_SITE_OTHER): Payer: Medicare Other | Admitting: Family Medicine

## 2021-07-31 VITALS — BP 158/86 | HR 75 | Temp 97.7°F | Ht 67.0 in | Wt 186.6 lb

## 2021-07-31 DIAGNOSIS — K148 Other diseases of tongue: Secondary | ICD-10-CM

## 2021-07-31 DIAGNOSIS — N401 Enlarged prostate with lower urinary tract symptoms: Secondary | ICD-10-CM

## 2021-07-31 DIAGNOSIS — E7800 Pure hypercholesterolemia, unspecified: Secondary | ICD-10-CM | POA: Diagnosis not present

## 2021-07-31 DIAGNOSIS — B0229 Other postherpetic nervous system involvement: Secondary | ICD-10-CM

## 2021-07-31 DIAGNOSIS — I10 Essential (primary) hypertension: Secondary | ICD-10-CM

## 2021-07-31 DIAGNOSIS — R3911 Hesitancy of micturition: Secondary | ICD-10-CM

## 2021-07-31 NOTE — Progress Notes (Signed)
Established Patient Office Visit  Subjective:  Patient ID: Jonathon Johnson, male    DOB: February 05, 1946  Age: 76 y.o. MRN: [id number]  CC:  Chief Complaint  Patient presents with   Follow-up    Follow up on medications, no concerns. Patient not fasting.     HPI Jonathon Johnson presents for follow-up of BPH treated with finasteride and tamsulosin, hypertension, LDL not at goal, and history of postherpetic neuralgia.  Been 6 months since his episode of herpetic neuralgia.  Advised him to go for the Shingrix vaccine.  I cardiology increased rosuvastatin to lower LDL.  Blood pressure at home runs in the upper 120s over 70s.  Cannot really tell that therapy with finasteride and tamsulosin has really helped his urine flow.  Past Medical History:  Diagnosis Date   Asthma    Chicken pox    GERD (gastroesophageal reflux disease)    Hyperlipidemia    Hypertension     Past Surgical History:  Procedure Laterality Date   CHOLECYSTECTOMY      Family History  Problem Relation Age of Onset   Alcohol abuse Mother    Depression Mother    Diabetes Mother    Alcohol abuse Father    COPD Father    Cancer Sister    Hypertension Brother    Migraines Daughter    Multiple sclerosis Daughter     Social History   Socioeconomic History   Marital status: Married    Spouse name: Not on file   Number of children: Not on file   Years of education: Not on file   Highest education level: Not on file  Occupational History   Not on file  Tobacco Use   Smoking status: Former    Types: Cigarettes    Quit date: 07/19/2010    Years since quitting: 11.0   Smokeless tobacco: Never  Substance and Sexual Activity   Alcohol use: Yes    Alcohol/week: 14.0 standard drinks    Types: 14 Glasses of wine per week    Comment: 3-4 glasses of wine daily   Drug use: Never   Sexual activity: Yes    Partners: Female  Other Topics Concern   Not on file  Social History Narrative   Right handed   Social  Determinants of Health   Financial Resource Strain: Low Risk    Difficulty of Paying Living Expenses: Not hard at all  Food Insecurity: No Food Insecurity   Worried About Programme researcher, broadcasting/film/video in the Last Year: Never true   Ran Out of Food in the Last Year: Never true  Transportation Needs: No Transportation Needs   Lack of Transportation (Medical): No   Lack of Transportation (Non-Medical): No  Physical Activity: Sufficiently Active   Days of Exercise per Week: 3 days   Minutes of Exercise per Session: 60 min  Stress: No Stress Concern Present   Feeling of Stress : Only a little  Social Connections: Moderately Integrated   Frequency of Communication with Friends and Family: More than three times a week   Frequency of Social Gatherings with Friends and Family: Once a week   Attends Religious Services: 1 to 4 times per year   Active Member of [employer] or Organizations: No   Attends Banker Meetings: Never   Marital Status: Married  Catering manager Violence: Not At Risk   Fear of Current or Ex-Partner: No   Emotionally Abused: No   Physically Abused: No   Sexually Abused: No  Outpatient Medications Prior to Visit  Medication Sig Dispense Refill   amitriptyline (ELAVIL) 25 MG tablet TAKE 1 TABLET AT BEDTIME 90 tablet 3   carvedilol (COREG) 12.5 MG tablet TAKE 1 TABLET TWICE A DAY WITH A MEAL (KEEP OFFICE VISIT) 180 tablet 3   finasteride (PROSCAR) 5 MG tablet TAKE 1 TABLET DAILY 90 tablet 3   fluticasone (FLONASE) 50 MCG/ACT nasal spray USE 2 SPRAYS IN EACH NOSTRIL DAILY 48 g 3   Fluticasone-Salmeterol (ADVAIR) 250-50 MCG/DOSE AEPB Inhale 1 puff into the lungs daily.     ibuprofen (ADVIL) 600 MG tablet Take 600 mg by mouth daily.     losartan (COZAAR) 100 MG tablet TAKE 1 TABLET DAILY 90 tablet 3   Multiple Vitamin (MULTIVITAMIN) tablet Take 1 tablet by mouth daily.     omeprazole (PRILOSEC) 40 MG capsule TAKE 1 CAPSULE DAILY 90 capsule 3   rosuvastatin (CRESTOR) 20 MG  tablet Take 1 tablet (20 mg total) by mouth daily. 90 tablet 3   tamsulosin (FLOMAX) 0.4 MG CAPS capsule TAKE 2 CAPSULES DAILY AFTER BREAKFAST 180 capsule 3   COVID-19 mRNA bivalent vaccine, Pfizer, injection Inject into the muscle. 0.3 mL 0   COVID-19 mRNA vaccine, Pfizer, 30 MCG/0.3ML injection Inject into the muscle. 0.3 mL 0   influenza vaccine adjuvanted (FLUAD) 0.5 ML injection Inject into the muscle. 0.5 mL 0   predniSONE (DELTASONE) 20 MG tablet  (Patient not taking: Reported on 07/24/2021)     predniSONE (STERAPRED UNI-PAK 48 TAB) 10 MG (48) TBPK tablet  (Patient not taking: Reported on 07/24/2021)     pregabalin (LYRICA) 150 MG capsule  (Patient not taking: Reported on 07/24/2021)     No facility-administered medications prior to visit.    Allergies  Allergen Reactions   Amoxicillin Diarrhea   Morphine And Related Nausea And Vomiting    ROS Review of Systems  Constitutional: Negative.   HENT: Negative.    Eyes:  Negative for photophobia and visual disturbance.  Respiratory: Negative.    Cardiovascular: Negative.   Gastrointestinal: Negative.   Genitourinary:  Positive for difficulty urinating, frequency and urgency.  Musculoskeletal:  Positive for back pain.  Skin:  Negative for pallor and rash.  Neurological:  Negative for weakness.  Psychiatric/Behavioral: Negative.       Objective:    Physical Exam Vitals and nursing note reviewed.  Constitutional:      General: He is not in acute distress.    Appearance: Normal appearance. He is not ill-appearing, toxic-appearing or diaphoretic.  HENT:     Head: Normocephalic and atraumatic.     Right Ear: External ear normal.     Left Ear: External ear normal.     Mouth/Throat:     Mouth: Mucous membranes are moist.     Pharynx: Oropharynx is clear. No oropharyngeal exudate or posterior oropharyngeal erythema.   Eyes:     Extraocular Movements: Extraocular movements intact.     Conjunctiva/sclera: Conjunctivae normal.      Pupils: Pupils are equal, round, and reactive to light.  Neck:     Vascular: No carotid bruit.  Cardiovascular:     Rate and Rhythm: Normal rate and regular rhythm.  Pulmonary:     Effort: Pulmonary effort is normal.     Breath sounds: Normal breath sounds.  Abdominal:     General: Bowel sounds are normal.  Musculoskeletal:     Cervical back: No rigidity or tenderness.  Lymphadenopathy:     Cervical: No cervical adenopathy.  Skin:    General: Skin is warm and dry.  Neurological:     Mental Status: He is alert and oriented to person, place, and time.  Psychiatric:        Mood and Affect: Mood normal.        Behavior: Behavior normal.    BP (!) 158/86 (BP Location: Right Arm, Patient Position: Sitting, Cuff Size: Normal)    Pulse 75    Temp 97.7 F (36.5 C) (Temporal)    Ht 5\' 7"  (1.702 m)    Wt 186 lb 9.6 oz (84.6 kg)    SpO2 96%    BMI 29.23 kg/m  Wt Readings from Last 3 Encounters:  07/31/21 186 lb 9.6 oz (84.6 kg)  04/18/21 190 lb 9.6 oz (86.5 kg)  01/18/21 192 lb 12.8 oz (87.5 kg)     Health Maintenance Due  Topic Date Due   Zoster Vaccines- Shingrix (1 of 2) Never done   INFLUENZA VACCINE  01/07/2021    There are no preventive care reminders to display for this patient.  Lab Results  Component Value Date   TSH 1.462 12/08/2019   Lab Results  Component Value Date   WBC 5.0 09/28/2020   HGB 14.4 09/28/2020   HCT 42.1 09/28/2020   MCV 93.6 09/28/2020   PLT 272.0 09/28/2020   Lab Results  Component Value Date   NA 139 09/28/2020   K 4.4 09/28/2020   CO2 29 09/28/2020   GLUCOSE 93 09/28/2020   BUN 17 09/28/2020   CREATININE 0.83 09/28/2020   BILITOT 0.5 09/28/2020   ALKPHOS 64 09/28/2020   AST 29 09/28/2020   ALT 33 09/28/2020   PROT 7.1 09/28/2020   ALBUMIN 4.1 09/28/2020   CALCIUM 9.4 09/28/2020   ANIONGAP 13 12/07/2019   GFR 86.25 09/28/2020   Lab Results  Component Value Date   CHOL 185 09/28/2020   Lab Results  Component Value Date    HDL 72.00 09/28/2020   Lab Results  Component Value Date   LDLCALC 91 09/28/2020   Lab Results  Component Value Date   TRIG 112.0 09/28/2020   Lab Results  Component Value Date   CHOLHDL 3 09/28/2020   No results found for: HGBA1C    Assessment & Plan:   Problem List Items Addressed This Visit       Cardiovascular and Mediastinum   Essential hypertension   Relevant Orders   CBC   Comprehensive metabolic panel     Nervous and Auditory   Post herpetic neuralgia     Genitourinary   Benign prostatic hyperplasia with urinary hesitancy   Relevant Orders   PSA   Ambulatory referral to Urology     Other   Elevated LDL cholesterol level - Primary   Relevant Orders   Comprehensive metabolic panel   Lipid panel   LDL cholesterol, direct   Other Visit Diagnoses     Mass of tongue       Relevant Orders   Ambulatory referral to ENT       No orders of the defined types were placed in this encounter.   Follow-up: Return in about 6 months (around 01/28/2022).  Return fasting for blood work.  Advise Shingrix vaccine.  Rechecking PSA today.  Urology consultation for BPH.  ENT consultation for evaluation of mass of tongue versus hypertrophy taste bud.  01/30/2022, MD

## 2021-08-01 ENCOUNTER — Other Ambulatory Visit (INDEPENDENT_AMBULATORY_CARE_PROVIDER_SITE_OTHER): Payer: Medicare Other

## 2021-08-01 ENCOUNTER — Other Ambulatory Visit: Payer: Self-pay

## 2021-08-01 DIAGNOSIS — E7800 Pure hypercholesterolemia, unspecified: Secondary | ICD-10-CM

## 2021-08-01 DIAGNOSIS — I10 Essential (primary) hypertension: Secondary | ICD-10-CM

## 2021-08-01 DIAGNOSIS — N401 Enlarged prostate with lower urinary tract symptoms: Secondary | ICD-10-CM | POA: Diagnosis not present

## 2021-08-01 DIAGNOSIS — R3911 Hesitancy of micturition: Secondary | ICD-10-CM | POA: Diagnosis not present

## 2021-08-01 LAB — COMPREHENSIVE METABOLIC PANEL
ALT: 33 U/L (ref 0–53)
AST: 34 U/L (ref 0–37)
Albumin: 4.3 g/dL (ref 3.5–5.2)
Alkaline Phosphatase: 62 U/L (ref 39–117)
BUN: 15 mg/dL (ref 6–23)
CO2: 31 mEq/L (ref 19–32)
Calcium: 9.3 mg/dL (ref 8.4–10.5)
Chloride: 100 mEq/L (ref 96–112)
Creatinine, Ser: 0.97 mg/dL (ref 0.40–1.50)
GFR: 76.47 mL/min (ref >60.00)
Glucose, Bld: 92 mg/dL (ref 70–99)
Potassium: 4.5 mEq/L (ref 3.5–5.1)
Sodium: 136 mEq/L (ref 135–145)
Total Bilirubin: 0.6 mg/dL (ref 0.2–1.2)
Total Protein: 6.9 g/dL (ref 6.0–8.3)

## 2021-08-01 LAB — CBC
HCT: 42.3 % (ref 39.0–52.0)
Hemoglobin: 14.2 g/dL (ref 13.0–17.0)
MCHC: 33.6 g/dL (ref 30.0–36.0)
MCV: 92.9 fl (ref 78.0–100.0)
Platelets: 233 10*3/uL (ref 150.0–400.0)
RBC: 4.55 Mil/uL (ref 4.22–5.81)
RDW: 13.2 % (ref 11.5–15.5)
WBC: 5.6 10*3/uL (ref 4.0–10.5)

## 2021-08-01 LAB — LDL CHOLESTEROL, DIRECT: Direct LDL: 101 mg/dL

## 2021-08-01 LAB — LIPID PANEL
Cholesterol: 202 mg/dL — ABNORMAL HIGH (ref 0–200)
HDL: 84 mg/dL (ref >39.00)
LDL Cholesterol: 103 mg/dL — ABNORMAL HIGH (ref 0–99)
NonHDL: 118.31
Total CHOL/HDL Ratio: 2
Triglycerides: 79 mg/dL (ref 0.0–149.0)
VLDL: 15.8 mg/dL (ref 0.0–40.0)

## 2021-08-01 LAB — PSA: PSA: 0.91 ng/mL (ref 0.10–4.00)

## 2021-08-02 ENCOUNTER — Encounter: Payer: Self-pay | Admitting: Family Medicine

## 2021-08-07 ENCOUNTER — Other Ambulatory Visit: Payer: Self-pay | Admitting: Family

## 2021-08-07 ENCOUNTER — Other Ambulatory Visit: Payer: Self-pay | Admitting: Family Medicine

## 2021-08-12 DIAGNOSIS — M5416 Radiculopathy, lumbar region: Secondary | ICD-10-CM | POA: Diagnosis not present

## 2021-08-12 DIAGNOSIS — M48062 Spinal stenosis, lumbar region with neurogenic claudication: Secondary | ICD-10-CM | POA: Diagnosis not present

## 2021-08-13 ENCOUNTER — Other Ambulatory Visit: Payer: Self-pay | Admitting: Orthopedic Surgery

## 2021-08-13 DIAGNOSIS — M545 Low back pain, unspecified: Secondary | ICD-10-CM

## 2021-08-19 ENCOUNTER — Other Ambulatory Visit: Payer: Self-pay | Admitting: Orthopedic Surgery

## 2021-08-19 ENCOUNTER — Telehealth: Payer: Self-pay

## 2021-08-19 NOTE — Telephone Encounter (Signed)
? ?  Pre-operative Risk Assessment  ?  ?Patient Name: Jonathon Johnson  ?DOB: 1945-09-25 ?MRN: [id number]  ? ?  ? ?Request for Surgical Clearance   ? ?Procedure:   Right Transforaminal Lumbar Interbody Fusion and Decompression Lumbar 4-5  ? ?Date of Surgery:  Clearance 09/25/21                              ?   ?Surgeon:  Dr. Estill Bamberg ?Surgeon's Group or Practice Name:  [facility]  ?Phone number:  [phone] ?Fax number:  [phone] ?  ?Type of Clearance Requested:   ?- Medical  ?- Pharmacy:  Hold      ?  ?Type of Anesthesia:  Not Indicated ?  ?Additional requests/questions:     ? ?Signed, ?Sumer Moorehouse B Aneesha Holloran   ?08/19/2021, 4:53 PM  ? ?

## 2021-08-19 NOTE — Telephone Encounter (Signed)
? ?  Name: Jonathon Johnson  ?DOB: [birth year]  ?MRN: [id number] ? ?Primary Cardiologist: Donato Heinz, MD ? ?Chart reviewed as part of pre-operative protocol coverage. Because of Yiannis Reeder's past medical history and time since last visit, he will require a follow-up visit in order to better assess preoperative cardiovascular risk. ? ?Pre-op covering staff: ?- Please schedule appointment and call patient to inform them. If patient already had an upcoming appointment within acceptable timeframe, please add "pre-op clearance" to the appointment notes so provider is aware. ?- Please contact requesting surgeon's office via preferred method (i.e, phone, fax) to inform them of need for appointment prior to surgery. ? ?If applicable, this message will also be routed to pharmacy pool and/or primary cardiologist for input on holding anticoagulant/antiplatelet agent as requested below so that this information is available to the clearing provider at time of patient's appointment.  ? ?Almyra Deforest, [place]  ?08/19/2021, 6:40 PM  ? ?

## 2021-08-20 NOTE — Progress Notes (Signed)
? ?Cardiology Office Note:   ? ?Date:  08/26/2021  ? ?ID:  Jonathon Johnson, DOB Apr 20, 1946, MRN [id number] ? ?PCP:  Mliss Sax, MD ?  ?[facility] Providers ?Cardiologist:  Little Ishikawa, MD  ?   ? ?Referring MD: Mliss Sax,*  ? ?Follow-up for hypertension and preoperative cardiac evaluation ? ?History of Present Illness:   ? ?Jonathon Johnson is a 76 y.o. male with a hx of essential hypertension, GERD, chronic low back pain, BPH, insomnia, and HLD. ? ?He presented to the emergency department on 6/21 with tachycardia.  He reported having palpitations and feeling his heart race.  He checked his pulse and it was in the 130s.  He presented to the emergency department his pulse remained at 120s.  He was noted to have sinus tachycardia.  His lab work showed normal electrolytes, TSH, and negative troponins x2.  His D-dimer was negative.  He received IV fluids, p.o. Ativan, IV metoprolol and his heart rate improved to the 80s-90s.  His echocardiogram 7/21 showed normal biventricular function and no significant valvular disease.  A cardiac event monitor 8/21 showed 23 episodes of SVT with the longest lasting 17 beats.  This patient triggered events corresponded with normal sinus rhythm with PACs and PVCs. ? ?He was seen by Dr. Bjorn Pippin on 04/18/2021.  During that time he was doing well.  He was working out at [employer] for 1 hour 3 times per week.  He denied exertional chest pain and dyspnea.  He reported that his palpitations had improved.  He did report episodes where he felt like his heart was skipping beats while he had been at the gym.  The episodes would last for around 1 minute.  He also reported an episode where he felt faint with bending over.  He denied syncope.  He reported that his blood pressure had been in the 100s-130s when he was checking at home. ? ?He presents to the clinic today for follow-up evaluation states he feels well.  He reports that he has occasional palpitations.   They are brief in nature.  He also notices occasional episodes of increased blood pressure.  We reviewed his blood pressure averages that he appears to be in the 120/80 range.  He continues to be very physically active and works out at [employer] several days per week.  I will give him the salty 6 diet sheet, have him maintain his physical activity, and plan follow-up for 6 to 9 months. ? ?Today he denies chest pain, shortness of breath, lower extremity edema, fatigue, palpitations, melena, hematuria, hemoptysis, diaphoresis, weakness, presyncope, syncope, orthopnea, and PND. ? ? ?Past Medical History:  ?Diagnosis Date  ? Asthma   ? Chicken pox   ? GERD (gastroesophageal reflux disease)   ? Hyperlipidemia   ? Hypertension   ? ? ?Past Surgical History:  ?Procedure Laterality Date  ? CHOLECYSTECTOMY    ? ? ?Current Medications: ?Current Meds  ?Medication Sig  ? amitriptyline (ELAVIL) 25 MG tablet TAKE 1 TABLET AT BEDTIME  ? carvedilol (COREG) 12.5 MG tablet TAKE 1 TABLET TWICE A DAY WITH A MEAL (KEEP OFFICE VISIT)  ? finasteride (PROSCAR) 5 MG tablet TAKE 1 TABLET DAILY  ? fluticasone (FLONASE) 50 MCG/ACT nasal spray USE 2 SPRAYS IN EACH NOSTRIL DAILY  ? ibuprofen (ADVIL) 600 MG tablet Take 600 mg by mouth daily.  ? losartan (COZAAR) 100 MG tablet TAKE 1 TABLET DAILY  ? Multiple Vitamin (MULTIVITAMIN) tablet Take 1 tablet by mouth  daily.  ? omeprazole (PRILOSEC) 40 MG capsule TAKE 1 CAPSULE DAILY  ? rosuvastatin (CRESTOR) 20 MG tablet Take 1 tablet (20 mg total) by mouth daily.  ? tamsulosin (FLOMAX) 0.4 MG CAPS capsule TAKE 2 CAPSULES DAILY AFTER BREAKFAST  ? WIXELA INHUB 250-50 MCG/ACT AEPB USE 1 INHALATION TWICE A DAY  ?  ? ?Allergies:   Amoxicillin and Morphine and related  ? ?Social History  ? ?Socioeconomic History  ? Marital status: Married  ?  Spouse name: Not on file  ? Number of children: Not on file  ? Years of education: Not on file  ? Highest education level: Not on file  ?Occupational History  ? Not on file   ?Tobacco Use  ? Smoking status: Former  ?  Types: Cigarettes  ?  Quit date: 07/19/2010  ?  Years since quitting: 11.1  ? Smokeless tobacco: Never  ?Substance and Sexual Activity  ? Alcohol use: Yes  ?  Alcohol/week: 14.0 standard drinks  ?  Types: 14 Glasses of wine per week  ?  Comment: 3-4 glasses of wine daily  ? Drug use: Never  ? Sexual activity: Yes  ?  Partners: Female  ?Other Topics Concern  ? Not on file  ?Social History Narrative  ? Right handed  ? ?Social Determinants of Health  ? ?Financial Resource Strain: Low Risk   ? Difficulty of Paying Living Expenses: Not hard at all  ?Food Insecurity: No Food Insecurity  ? Worried About Programme researcher, broadcasting/film/videounning Out of Food in the Last Year: Never true  ? Ran Out of Food in the Last Year: Never true  ?Transportation Needs: No Transportation Needs  ? Lack of Transportation (Medical): No  ? Lack of Transportation (Non-Medical): No  ?Physical Activity: Sufficiently Active  ? Days of Exercise per Week: 3 days  ? Minutes of Exercise per Session: 60 min  ?Stress: No Stress Concern Present  ? Feeling of Stress : Only a little  ?Social Connections: Moderately Integrated  ? Frequency of Communication with Friends and Family: More than three times a week  ? Frequency of Social Gatherings with Friends and Family: Once a week  ? Attends Religious Services: 1 to 4 times per year  ? Active Member of Clubs or Organizations: No  ? Attends BankerClub or Organization Meetings: Never  ? Marital Status: Married  ?  ? ?Family History: ?The patient's family history includes Alcohol abuse in his father and mother; COPD in his father; Cancer in his sister; Depression in his mother; Diabetes in his mother; Hypertension in his brother; Migraines in his daughter; Multiple sclerosis in his daughter. ? ?ROS:   ?Please see the history of present illness.    ? All other systems reviewed and are negative. ? ? ?Risk Assessment/Calculations:   ?  ? ?    ? ?Physical Exam:   ? ?VS:  BP (!) 138/96 (BP Location: Left Arm,  Patient Position: Sitting, Cuff Size: Normal)   Pulse 82   Ht 5\' 7"  (1.702 m)   Wt 187 lb 1.6 oz (84.9 kg)   BMI 29.30 kg/m?    ? ?Wt Readings from Last 3 Encounters:  ?08/26/21 187 lb 1.6 oz (84.9 kg)  ?07/31/21 186 lb 9.6 oz (84.6 kg)  ?04/18/21 190 lb 9.6 oz (86.5 kg)  ?  ? ?GEN:  Well nourished, well developed in no acute distress ?HEENT: Normal ?NECK: No JVD; No carotid bruits ?LYMPHATICS: No lymphadenopathy ?CARDIAC: RRR, no murmurs, rubs, gallops ?RESPIRATORY:  Clear to auscultation  without rales, wheezing or rhonchi  ?ABDOMEN: Soft, non-tender, non-distended ?MUSCULOSKELETAL:  No edema; No deformity  ?SKIN: Warm and dry ?NEUROLOGIC:  Alert and oriented x 3 ?PSYCHIATRIC:  Normal affect  ? ? ?EKGs/Labs/Other Studies Reviewed:   ? ?The following studies were reviewed today: ? ?Echocardiogram 01/04/2020 ? ?IMPRESSIONS  ? ? ? 1. Left ventricular ejection fraction, by estimation, is 55 to 60%. The  ?left ventricle has normal function. The left ventricle has no regional  ?wall motion abnormalities. There is mild left ventricular hypertrophy.  ?Left ventricular diastolic parameters  ?are indeterminate.  ? 2. Right ventricular systolic function is normal. The right ventricular  ?size is normal. Tricuspid regurgitation signal is inadequate for assessing  ?PA pressure.  ? 3. The mitral valve is normal in structure. No evidence of mitral valve  ?regurgitation.  ? 4. The aortic valve is tricuspid. Aortic valve regurgitation is not  ?visualized. Mild to moderate aortic valve sclerosis/calcification is  ?present, without any evidence of aortic stenosis.  ? 5. The inferior vena cava is normal in size with greater than 50%  ?respiratory variability, suggesting right atrial pressure of 3 mmHg. ? ?EKG:  EKG is  ordered today.  The ekg ordered today demonstrates Normal sinus rhythm 82 bpm ? ?Echocardiogram 01/04/2020 ? ?IMPRESSIONS  ? ? ? 1. Left ventricular ejection fraction, by estimation, is 55 to 60%. The  ?left ventricle  has normal function. The left ventricle has no regional  ?wall motion abnormalities. There is mild left ventricular hypertrophy.  ?Left ventricular diastolic parameters  ?are indeterminate.  ? 2. Right ventr

## 2021-08-20 NOTE — Telephone Encounter (Signed)
Pt is agreeable to plan of care for pre op appt. Pt is scheduled to see Coletta Memos, FNP at [facility] location 08/26/21 @ 10:55. I will forward notes to FNP for appt. Will send FYI to requesting office the pt has appt.  ?

## 2021-08-21 ENCOUNTER — Encounter: Payer: Self-pay | Admitting: Family Medicine

## 2021-08-23 NOTE — Telephone Encounter (Signed)
Pt came by 3/17 wondering if we received the form for his surgery clearance. Dr Doreene Burke and Brendia Sacks were out of the office.  ?

## 2021-08-26 ENCOUNTER — Ambulatory Visit (INDEPENDENT_AMBULATORY_CARE_PROVIDER_SITE_OTHER): Payer: Medicare Other | Admitting: General Practice

## 2021-08-26 ENCOUNTER — Encounter (HOSPITAL_BASED_OUTPATIENT_CLINIC_OR_DEPARTMENT_OTHER): Payer: Self-pay | Admitting: General Practice

## 2021-08-26 ENCOUNTER — Other Ambulatory Visit: Payer: Self-pay

## 2021-08-26 VITALS — BP 138/96 | HR 82 | Ht 67.0 in | Wt 187.1 lb

## 2021-08-26 DIAGNOSIS — E785 Hyperlipidemia, unspecified: Secondary | ICD-10-CM | POA: Diagnosis not present

## 2021-08-26 DIAGNOSIS — Z01810 Encounter for preprocedural cardiovascular examination: Secondary | ICD-10-CM | POA: Diagnosis not present

## 2021-08-26 DIAGNOSIS — I10 Essential (primary) hypertension: Secondary | ICD-10-CM | POA: Diagnosis not present

## 2021-08-26 NOTE — Patient Instructions (Signed)
Medication Instructions:  ?Your Physician recommend you continue on your current medication as directed.   ? ?*If you need a refill on your cardiac medications before your next appointment, please call your pharmacy* ? ? ?Lab Work: ?We are requesting your most recent labs from your PCP  ? ?If you have labs (blood work) drawn today and your tests are completely normal, you will receive your results only by: ?MyChart Message (if you have MyChart) OR ?A paper copy in the mail ?If you have any lab test that is abnormal or we need to change your treatment, we will call you to review the results. ? ? ?Testing/Procedures: ?You are cleared for surgery and Edd Fabian, NP will send your clearance over to Dr. Azucena Cecil  ? ? ?Follow-Up: ?At [facility], you and your health needs are our priority.  As part of our continuing mission to provide you with exceptional heart care, we have created designated Provider Care Teams.  These Care Teams include your primary Cardiologist (physician) and Advanced Practice Providers (APPs -  Physician Assistants and Nurse Practitioners) who all work together to provide you with the care you need, when you need it. ? ?We recommend signing up for the patient portal called "MyChart".  Sign up information is provided on this After Visit Summary.  MyChart is used to connect with patients for Virtual Visits (Telemedicine).  Patients are able to view lab/test results, encounter notes, upcoming appointments, etc.  Non-urgent messages can be sent to your provider as well.   ?To learn more about what you can do with MyChart, go to [email or web address].   ? ?Your next appointment:   ?6-9 month(s) ? ?The format for your next appointment:   ?In Person ? ?Provider:   ?Little Ishikawa, MD  or Edd Fabian, NP { ? ?Other Instructions ? ? ?

## 2021-08-26 NOTE — Telephone Encounter (Signed)
Form faxed patient aware  °

## 2021-08-31 ENCOUNTER — Ambulatory Visit
Admission: RE | Admit: 2021-08-31 | Discharge: 2021-08-31 | Disposition: A | Discharge status: Home / Self Care | Payer: Medicare Other | Source: Ambulatory Visit | Attending: Orthopedic Surgery | Admitting: Orthopedic Surgery

## 2021-08-31 ENCOUNTER — Other Ambulatory Visit: Payer: Self-pay

## 2021-08-31 DIAGNOSIS — M545 Low back pain, unspecified: Secondary | ICD-10-CM | POA: Diagnosis not present

## 2021-08-31 DIAGNOSIS — M4316 Spondylolisthesis, lumbar region: Secondary | ICD-10-CM | POA: Diagnosis not present

## 2021-08-31 DIAGNOSIS — M48061 Spinal stenosis, lumbar region without neurogenic claudication: Secondary | ICD-10-CM | POA: Diagnosis not present

## 2021-08-31 DIAGNOSIS — M2578 Osteophyte, vertebrae: Secondary | ICD-10-CM | POA: Diagnosis not present

## 2021-08-31 DIAGNOSIS — M4126 Other idiopathic scoliosis, lumbar region: Secondary | ICD-10-CM | POA: Diagnosis not present

## 2021-09-02 ENCOUNTER — Encounter (HOSPITAL_BASED_OUTPATIENT_CLINIC_OR_DEPARTMENT_OTHER): Payer: Self-pay

## 2021-09-05 ENCOUNTER — Telehealth: Payer: Self-pay

## 2021-09-05 ENCOUNTER — Other Ambulatory Visit: Payer: Self-pay

## 2021-09-05 DIAGNOSIS — I10 Essential (primary) hypertension: Secondary | ICD-10-CM

## 2021-09-05 MED ORDER — VALSARTAN 160 MG PO TABS: 160.0000 mg | ORAL_TABLET | Freq: Every day | ORAL | 1 refills | Status: DC

## 2021-09-05 NOTE — Telephone Encounter (Signed)
Contacted patient, advised of message from NP- in regards to [employer].  ?Please contact Mr. Koo and let him know that his blood pressures have been reviewed.  We will stop his losartan and start him on valsartan 160 mg daily.  Please order a BMP 1 week after he starts the valsartan.  Please ask him to continue to eat a heart healthy low-sodium diet, increase his physical activity, check his blood pressure about an hour after taking his medication, and maintain blood pressure log.  Thank you, Thomasene Ripple. Cleaver NP-C ?  ?  ?09/05/2021, 8:42 AM ? [facility] ?[address] ?Office [phone] Fax [phone] ?  ? ?Patient aware to stop Losartan, start valsartan 160 mg daily (RX sent to pharmacy), BMET ordered patient aware to have labs drawn in 1 week.  ? ?Med list updated.  ? ?

## 2021-09-05 NOTE — Telephone Encounter (Signed)
Please contact Jonathon Johnson and let him know that his blood pressures have been reviewed.  We will stop his losartan and start him on valsartan 160 mg daily.  Please order a BMP 1 week after he starts the valsartan.  Please ask him to continue to eat a heart healthy low-sodium diet, increase his physical activity, check his blood pressure about an hour after taking his medication, and maintain blood pressure log.  Thank you, [name]. [name] NP-C ? ?  ?09/05/2021, 8:42 AM ?[name] ?[name] ?Office [phone] Fax [phone] ? ?

## 2021-09-11 NOTE — Pre-Procedure Instructions (Signed)
Surgical Instructions ? ? ? Your procedure is scheduled on Wednesday, April 19th. ? Report to [facility] Main Entrance "A" at 05:30 A.M., then check in with the Admitting office. ? Call this number if you have problems the morning of surgery: ? [phone] ? ? If you have any questions prior to your surgery date call [phone]: Open Monday-Friday 8am-4pm ? ? ? Remember: ? Do not eat after midnight the night before your surgery ? ?You may drink clear liquids until 05:30 AM the morning of your surgery.   ?Clear liquids allowed are: Water, Non-Citrus Juices (without pulp), Carbonated Beverages, Clear Tea, Black Coffee Only (NO MILK, CREAM OR POWDERED CREAMER of any kind), and Gatorade. ? ? ?Patient Instructions ? ?The night before surgery:  ?No food after midnight. ONLY clear liquids after midnight ? ?The day of surgery (if you do NOT have diabetes):  ?Drink ONE (1) Pre-Surgery Clear Ensure by 05:30 AM the morning of surgery. Drink in one sitting. Do not sip.  ?This drink was given to you during your hospital  ?pre-op appointment visit. ? ?Nothing else to drink after completing the  ?Pre-Surgery Clear Ensure. ? ? ?       If you have questions, please contact your surgeon?s office.  ? ?  ? Take these medicines the morning of surgery with A SIP OF WATER  ?carvedilol (COREG) ?finasteride (PROSCAR) ?fluticasone (FLONASE)  ?omeprazole (PRILOSEC) ?rosuvastatin (CRESTOR) ?tamsulosin [facility])  ?WIXELA INHUB inhaler ?Propylene Glycol-Glycerin eye drops- if needed ? ? ?As of today, STOP taking any Aspirin (unless otherwise instructed by your surgeon) Aleve, Naproxen, Ibuprofen, Motrin, Advil, Goody's, BC's, all herbal medications, fish oil, and all vitamins. ?         ?           ?Do NOT Smoke (Tobacco/Vaping) for 24 hours prior to your procedure. ? ?If you use a CPAP at night, you may bring your mask/headgear for your overnight stay. ?  ?Contacts, glasses, piercing's, hearing aid's, dentures or partials may not be worn into  surgery, please bring cases for these belongings.  ?  ?For patients admitted to the hospital, discharge time will be determined by your treatment team. ?  ?Patients discharged the day of surgery will not be allowed to drive home, and someone needs to stay with them for 24 hours. ? ?SURGICAL WAITING ROOM VISITATION ?Patients having surgery or a procedure may have two support people in the waiting room. These visitors may be switched out with other visitors if needed. ?Children under the age of 59 must have an adult accompany them who is not the patient. ?If the patient needs to stay at the hospital during part of their recovery, the visitor guidelines for inpatient rooms apply. ? ?Please refer to the [place] website for the visitor guidelines for Inpatients (after your surgery is over and you are in a regular room).  ? ? ?Special instructions:   ?[place]- Preparing For Surgery ? ?Before surgery, you can play an important role. Because skin is not sterile, your skin needs to be as free of germs as possible. You can reduce the number of germs on your skin by washing with CHG (chlorahexidine gluconate) Soap before surgery.  CHG is an antiseptic cleaner which kills germs and bonds with the skin to continue killing germs even after washing.   ? ?Oral Hygiene is also important to reduce your risk of infection.  Remember - BRUSH YOUR TEETH THE MORNING OF SURGERY WITH YOUR REGULAR TOOTHPASTE ? ?  Please do not use if you have an allergy to CHG or antibacterial soaps. If your skin becomes reddened/irritated stop using the CHG.  ?Do not shave (including legs and underarms) for at least 48 hours prior to first CHG shower. It is OK to shave your face. ? ?Please follow these instructions carefully. ?  ?Shower the NIGHT BEFORE SURGERY and the MORNING OF SURGERY ? ?If you chose to wash your hair, wash your hair first as usual with your normal shampoo. ? ?After you shampoo, rinse your hair and body thoroughly to remove the  shampoo. ? ?Use CHG Soap as you would any other liquid soap. You can apply CHG directly to the skin and wash gently with a scrungie or a clean washcloth.  ? ?Apply the CHG Soap to your body ONLY FROM THE NECK DOWN.  Do not use on open wounds or open sores. Avoid contact with your eyes, ears, mouth and genitals (private parts). Wash Face and genitals (private parts)  with your normal soap.  ? ?Wash thoroughly, paying special attention to the area where your surgery will be performed. ? ?Thoroughly rinse your body with warm water from the neck down. ? ?DO NOT shower/wash with your normal soap after using and rinsing off the CHG Soap. ? ?Pat yourself dry with a CLEAN TOWEL. ? ?Wear CLEAN PAJAMAS to bed the night before surgery ? ?Place CLEAN SHEETS on your bed the night before your surgery ? ?DO NOT SLEEP WITH PETS. ? ? ?Day of Surgery: ?Take a shower with CHG soap. ?Do not wear jewelry  ?Do not wear lotions, powders, colognes, or deodorant. ?Do not shave 48 hours prior to surgery.  Men may shave face and neck. ?Do not bring valuables to the hospital.  ?[facility] is not responsible for any belongings or valuables. ? ?Wear Clean/Comfortable clothing the morning of surgery ?Remember to brush your teeth WITH YOUR REGULAR TOOTHPASTE. ?  ?Please read over the following fact sheets that you were given. ? ? ? ?If you received a COVID test during your pre-op visit  it is requested that you wear a mask when out in public, stay away from anyone that may not be feeling well and notify your surgeon if you develop symptoms. If you have been in contact with anyone that has tested positive in the last 10 days please notify you surgeon.  ?

## 2021-09-13 ENCOUNTER — Encounter (HOSPITAL_COMMUNITY)
Admission: RE | Admit: 2021-09-13 | Discharge: 2021-09-13 | Disposition: A | Discharge status: Home / Self Care | Payer: Medicare Other | Source: Ambulatory Visit | Attending: Orthopedic Surgery | Admitting: Orthopedic Surgery

## 2021-09-13 ENCOUNTER — Encounter (HOSPITAL_COMMUNITY): Payer: Self-pay

## 2021-09-13 ENCOUNTER — Other Ambulatory Visit: Payer: Self-pay

## 2021-09-13 VITALS — BP 155/98 | HR 76 | Temp 98.4°F | Resp 17 | Ht 67.0 in | Wt 186.2 lb

## 2021-09-13 DIAGNOSIS — Z01812 Encounter for preprocedural laboratory examination: Secondary | ICD-10-CM | POA: Insufficient documentation

## 2021-09-13 DIAGNOSIS — I471 Supraventricular tachycardia: Secondary | ICD-10-CM | POA: Insufficient documentation

## 2021-09-13 DIAGNOSIS — Z01818 Encounter for other preprocedural examination: Secondary | ICD-10-CM

## 2021-09-13 DIAGNOSIS — I10 Essential (primary) hypertension: Secondary | ICD-10-CM | POA: Insufficient documentation

## 2021-09-13 DIAGNOSIS — E785 Hyperlipidemia, unspecified: Secondary | ICD-10-CM | POA: Insufficient documentation

## 2021-09-13 DIAGNOSIS — R002 Palpitations: Secondary | ICD-10-CM | POA: Insufficient documentation

## 2021-09-13 LAB — CBC
HCT: 41.8 % (ref 39.0–52.0)
Hemoglobin: 14.2 g/dL (ref 13.0–17.0)
MCH: 32.1 pg (ref 26.0–34.0)
MCHC: 34 g/dL (ref 30.0–36.0)
MCV: 94.4 fL (ref 80.0–100.0)
Platelets: 243 10*3/uL (ref 150–400)
RBC: 4.43 MIL/uL (ref 4.22–5.81)
RDW: 12.6 % (ref 11.5–15.5)
WBC: 6.3 10*3/uL (ref 4.0–10.5)
nRBC: 0 % (ref 0.0–0.2)

## 2021-09-13 LAB — COMPREHENSIVE METABOLIC PANEL
ALT: 38 U/L (ref 0–44)
AST: 41 U/L (ref 15–41)
Albumin: 3.7 g/dL (ref 3.5–5.0)
Alkaline Phosphatase: 56 U/L (ref 38–126)
Anion gap: 6 (ref 5–15)
BUN: 11 mg/dL (ref 8–23)
CO2: 28 mmol/L (ref 22–32)
Calcium: 9.4 mg/dL (ref 8.9–10.3)
Chloride: 104 mmol/L (ref 98–111)
Creatinine, Ser: 0.92 mg/dL (ref 0.61–1.24)
GFR, Estimated: >60 mL/min (ref >60)
Glucose, Bld: 120 mg/dL — ABNORMAL HIGH (ref 70–99)
Potassium: 4.2 mmol/L (ref 3.5–5.1)
Sodium: 138 mmol/L (ref 135–145)
Total Bilirubin: 0.7 mg/dL (ref 0.3–1.2)
Total Protein: 6.7 g/dL (ref 6.5–8.1)

## 2021-09-13 LAB — SURGICAL PCR SCREEN
MRSA, PCR: NEGATIVE
Staphylococcus aureus: NEGATIVE

## 2021-09-13 LAB — TYPE AND SCREEN
ABO/RH(D): B POS
Antibody Screen: NEGATIVE

## 2021-09-13 NOTE — Progress Notes (Signed)
PCP:  Abelino Derrick, MD ?Cardiologist:  Oswaldo Milian, MD ? ?EKG:  08/26/21 ?CXR:  na ?ECHO:  01/04/20 ?Stress Test:  >10 years.  Normal per patient ?Cardiac Cath:  denies ? ?Fasting Blood Sugar-  na ?Checks Blood Sugar__na_ times a day ? ?ASA/Blood Thinner: No ? ?OSA/CPAP: NO ? ?Covid test not needed ? ?Anesthesia Review: Yes, cardiac history of SVT ? ?Patient denies shortness of breath, fever, cough, and chest pain at PAT appointment. ? ?Patient verbalized understanding of instructions provided today at the PAT appointment.  Patient asked to review instructions at home and day of surgery.   ?

## 2021-09-13 NOTE — Pre-Procedure Instructions (Addendum)
Surgical Instructions ? ? ? Your procedure is scheduled on Wednesday, April 19th. ? Report to [facility] Main Entrance "A" at 5:30 A.M., then check in with the Admitting office. ? Call this number if you have problems the morning of surgery: ? [phone] ? ? If you have any questions prior to your surgery date call [phone]: Open Monday-Friday 8am-4pm ? ? ? Remember: ? Do not eat after midnight the night before your surgery ? ?You may drink clear liquids until 05:30 AM the morning of your surgery.   ?Clear liquids allowed are: Water, Non-Citrus Juices (without pulp), Carbonated Beverages, Clear Tea, Black Coffee Only (NO MILK, CREAM OR POWDERED CREAMER of any kind), and Gatorade. ? ? ?Patient Instructions ? ?The night before surgery:  ?No food after midnight. ONLY clear liquids after midnight ? ?The day of surgery (if you do NOT have diabetes):  ?Drink ONE (1) Pre-Surgery Clear Ensure by 05:30 AM the morning of surgery. Drink in one sitting. Do not sip.  ?This drink was given to you during your hospital  ?pre-op appointment visit. ? ?Nothing else to drink after completing the  ?Pre-Surgery Clear Ensure. ? ? ?       If you have questions, please contact your surgeon?s office.  ? ?  ? Take these medicines the morning of surgery with A SIP OF WATER  ?carvedilol (COREG) ?finasteride (PROSCAR) ?fluticasone (FLONASE)  ?omeprazole (PRILOSEC) ?rosuvastatin (CRESTOR) ?tamsulosin [facility])  ?WIXELA INHUB inhaler ?Propylene Glycol-Glycerin eye drops- if needed ? ? ?As of today, STOP taking any Aspirin (unless otherwise instructed by your surgeon) Aleve, Naproxen, Ibuprofen, Motrin, Advil, Goody's, BC's, all herbal medications, fish oil, and all vitamins. ?         ?           ?Do NOT Smoke (Tobacco/Vaping) for 24 hours prior to your procedure. ? ?If you use a CPAP at night, you may bring your mask/headgear for your overnight stay. ?  ?Contacts, glasses, piercing's, hearing aid's, dentures or partials may not be worn into  surgery, please bring cases for these belongings.  ?  ?For patients admitted to the hospital, discharge time will be determined by your treatment team. ?  ?Patients discharged the day of surgery will not be allowed to drive home, and someone needs to stay with them for 24 hours. ? ?SURGICAL WAITING ROOM VISITATION ?Patients having surgery or a procedure may have two support people in the waiting room. These visitors may be switched out with other visitors if needed. ?Children under the age of 24 must have an adult accompany them who is not the patient. ?If the patient needs to stay at the hospital during part of their recovery, the visitor guidelines for inpatient rooms apply. ? ?Please refer to the [place] website for the visitor guidelines for Inpatients (after your surgery is over and you are in a regular room).  ? ? ?Special instructions:   ?[place]- Preparing For Surgery ? ?Before surgery, you can play an important role. Because skin is not sterile, your skin needs to be as free of germs as possible. You can reduce the number of germs on your skin by washing with CHG (chlorahexidine gluconate) Soap before surgery.  CHG is an antiseptic cleaner which kills germs and bonds with the skin to continue killing germs even after washing.   ? ?Oral Hygiene is also important to reduce your risk of infection.  Remember - BRUSH YOUR TEETH THE MORNING OF SURGERY WITH YOUR REGULAR TOOTHPASTE ? ?  Please do not use if you have an allergy to CHG or antibacterial soaps. If your skin becomes reddened/irritated stop using the CHG.  ?Do not shave (including legs and underarms) for at least 48 hours prior to first CHG shower. It is OK to shave your face. ? ?Please follow these instructions carefully. ?  ?Shower the NIGHT BEFORE SURGERY and the MORNING OF SURGERY ? ?If you chose to wash your hair, wash your hair first as usual with your normal shampoo. ? ?After you shampoo, rinse your hair and body thoroughly to remove the  shampoo. ? ?Use CHG Soap as you would any other liquid soap. You can apply CHG directly to the skin and wash gently with a scrungie or a clean washcloth.  ? ?Apply the CHG Soap to your body ONLY FROM THE NECK DOWN.  Do not use on open wounds or open sores. Avoid contact with your eyes, ears, mouth and genitals (private parts). Wash Face and genitals (private parts)  with your normal soap.  ? ?Wash thoroughly, paying special attention to the area where your surgery will be performed. ? ?Thoroughly rinse your body with warm water from the neck down. ? ?DO NOT shower/wash with your normal soap after using and rinsing off the CHG Soap. ? ?Pat yourself dry with a CLEAN TOWEL. ? ?Wear CLEAN PAJAMAS to bed the night before surgery ? ?Place CLEAN SHEETS on your bed the night before your surgery ? ?DO NOT SLEEP WITH PETS. ? ? ?Day of Surgery: ?Take a shower with CHG soap. ?Do not wear jewelry  ?Do not wear lotions, powders, colognes, or deodorant. ?Do not shave 48 hours prior to surgery.  Men may shave face and neck. ?Do not bring valuables to the hospital.  ?[facility] is not responsible for any belongings or valuables. ? ?Wear Clean/Comfortable clothing the morning of surgery ?Remember to brush your teeth WITH YOUR REGULAR TOOTHPASTE. ?  ?Please read over the following fact sheets that you were given. ? ? ? ?If you received a COVID test during your pre-op visit  it is requested that you wear a mask when out in public, stay away from anyone that may not be feeling well and notify your surgeon if you develop symptoms. If you have been in contact with anyone that has tested positive in the last 10 days please notify you surgeon.  ?

## 2021-09-16 DIAGNOSIS — K219 Gastro-esophageal reflux disease without esophagitis: Secondary | ICD-10-CM | POA: Diagnosis not present

## 2021-09-16 DIAGNOSIS — J309 Allergic rhinitis, unspecified: Secondary | ICD-10-CM | POA: Diagnosis not present

## 2021-09-16 NOTE — Anesthesia Preprocedure Evaluation (Addendum)
Anesthesia Evaluation  ?Patient identified by MRN, date of birth, ID band ?Patient awake ? ? ? ?Reviewed: ?Allergy & Precautions, NPO status , Patient's Chart, lab work & pertinent test results, reviewed documented beta blocker date and time  ? ?History of Anesthesia Complications ?Negative for: history of anesthetic complications ? ?Airway ?Mallampati: II ? ?TM Distance: >3 FB ?Neck ROM: Full ? ? ? Dental ? ?(+) Dental Advisory Given, Chipped ?  ?Pulmonary ?asthma , former smoker,  ?  ?Pulmonary exam normal ? ? ? ? ? ? ? Cardiovascular ?hypertension, Pt. on medications and Pt. on home beta blockers ?Normal cardiovascular exam ? ? ?  ?Neuro/Psych ?negative neurological ROS ? negative psych ROS  ? GI/Hepatic ?GERD  Medicated and Controlled,(+)  ?  ? substance abuse ? alcohol use,   ?Endo/Other  ?negative endocrine ROS ? Renal/GU ?negative Renal ROS  ? ?  ?Musculoskeletal ?negative musculoskeletal ROS ?(+)  ? Abdominal ?  ?Peds ? Hematology ?negative hematology ROS ?(+)   ?Anesthesia Other Findings ? ? Reproductive/Obstetrics ? ?  ? ? ? ? ? ? ? ? ? ? ? ? ? ?  ?  ? ? ? ? ? ? ?Anesthesia Physical ?Anesthesia Plan ? ?ASA: 2 ? ?Anesthesia Plan: General  ? ?Post-op Pain Management: Tylenol PO (pre-op)*  ? ?Induction: Intravenous ? ?PONV Risk Score and Plan: 2 and Treatment may vary due to age or medical condition, Ondansetron and Dexamethasone ? ?Airway Management Planned: Oral ETT ? ?Additional Equipment: None ? ?Intra-op Plan:  ? ?Post-operative Plan: Extubation in OR ? ?Informed Consent: I have reviewed the patients History and Physical, chart, labs and discussed the procedure including the risks, benefits and alternatives for the proposed anesthesia with the patient or authorized representative who has indicated his/her understanding and acceptance.  ? ? ? ?Dental advisory given ? ?Plan Discussed with: CRNA and Anesthesiologist ? ?Anesthesia Plan Comments:   ? ? ? ? ?Anesthesia Quick  Evaluation ? ?

## 2021-09-16 NOTE — Progress Notes (Signed)
Anesthesia Chart Review: ? ?Follows with cardiology for history of hypertension, palpitations/SVT, hyperlipidemia. Echocardiogram 7/21 showed normal biventricular function and no significant valvular disease.  A cardiac event monitor 8/21 showed 23 episodes of SVT with the longest lasting 17 beats.  This patient triggered events corresponded with normal sinus rhythm with PACs and PVCs.  Last seen by Edd Fabian, NP on 08/26/2021 and cleared for surgery.  Per note, "Chart reviewed as part of pre-operative protocol coverage. Given past medical history and time since last visit, based on ACC/AHA guidelines, Dennard Faso would be at acceptable risk for the planned procedure without further cardiovascular testing. Patient was advised that if he develops new symptoms prior to surgery to contact our office to arrange a follow-up appointment.  He verbalized understanding. His RCRI is a class II risk, 0.9% risk of major cardiac event.  He is able to complete greater than 4 METS of physical activity." ?  ?Preop labs reviewed, unremarkable. ? ?EKG 08/26/2021: NSR.  Rate 82. ? ?Echocardiogram 01/04/2020 ? IMPRESSIONS  ? 1. Left ventricular ejection fraction, by estimation, is 55 to 60%. The  ?left ventricle has normal function. The left ventricle has no regional  ?wall motion abnormalities. There is mild left ventricular hypertrophy.  ?Left ventricular diastolic parameters  ?are indeterminate.  ? 2. Right ventricular systolic function is normal. The right ventricular  ?size is normal. Tricuspid regurgitation signal is inadequate for assessing  ?PA pressure.  ? 3. The mitral valve is normal in structure. No evidence of mitral valve  ?regurgitation.  ? 4. The aortic valve is tricuspid. Aortic valve regurgitation is not  ?visualized. Mild to moderate aortic valve sclerosis/calcification is  ?present, without any evidence of aortic stenosis.  ? 5. The inferior vena cava is normal in size with greater than 50%  ?respiratory  variability, suggesting right atrial pressure of 3 mmHg. ? ? ? ?Antionette Poles, PA-C ?[facility]/Anesthesiology ?Phone [phone] ?09/16/2021 3:53 PM ? ?

## 2021-09-25 ENCOUNTER — Ambulatory Visit (HOSPITAL_COMMUNITY): Payer: Medicare Other | Admitting: Physician Assistant

## 2021-09-25 ENCOUNTER — Ambulatory Visit (HOSPITAL_BASED_OUTPATIENT_CLINIC_OR_DEPARTMENT_OTHER): Payer: Medicare Other | Admitting: Anesthesiology

## 2021-09-25 ENCOUNTER — Ambulatory Visit (HOSPITAL_COMMUNITY): Payer: Medicare Other

## 2021-09-25 ENCOUNTER — Observation Stay (HOSPITAL_COMMUNITY)
Admission: RE | Admit: 2021-09-25 | Discharge: 2021-09-26 | Disposition: A | Discharge status: Home Health | Payer: Medicare Other | Attending: Orthopedic Surgery | Admitting: Orthopedic Surgery

## 2021-09-25 ENCOUNTER — Other Ambulatory Visit: Payer: Self-pay

## 2021-09-25 ENCOUNTER — Encounter (HOSPITAL_COMMUNITY): Payer: Self-pay | Admitting: Orthopedic Surgery

## 2021-09-25 ENCOUNTER — Encounter (HOSPITAL_COMMUNITY)
Admission: RE | Disposition: A | Discharge status: Home Health | Payer: Self-pay | Source: Home / Self Care | Attending: Orthopedic Surgery

## 2021-09-25 DIAGNOSIS — Z79899 Other long term (current) drug therapy: Secondary | ICD-10-CM | POA: Diagnosis not present

## 2021-09-25 DIAGNOSIS — Z981 Arthrodesis status: Secondary | ICD-10-CM | POA: Diagnosis not present

## 2021-09-25 DIAGNOSIS — M48061 Spinal stenosis, lumbar region without neurogenic claudication: Secondary | ICD-10-CM

## 2021-09-25 DIAGNOSIS — M5416 Radiculopathy, lumbar region: Secondary | ICD-10-CM | POA: Insufficient documentation

## 2021-09-25 DIAGNOSIS — M4316 Spondylolisthesis, lumbar region: Secondary | ICD-10-CM

## 2021-09-25 DIAGNOSIS — Z87891 Personal history of nicotine dependence: Secondary | ICD-10-CM | POA: Insufficient documentation

## 2021-09-25 DIAGNOSIS — I10 Essential (primary) hypertension: Secondary | ICD-10-CM | POA: Insufficient documentation

## 2021-09-25 DIAGNOSIS — M5136 Other intervertebral disc degeneration, lumbar region: Secondary | ICD-10-CM | POA: Diagnosis not present

## 2021-09-25 DIAGNOSIS — M5410 Radiculopathy, site unspecified: Secondary | ICD-10-CM | POA: Diagnosis present

## 2021-09-25 DIAGNOSIS — M48062 Spinal stenosis, lumbar region with neurogenic claudication: Secondary | ICD-10-CM | POA: Diagnosis not present

## 2021-09-25 DIAGNOSIS — M5116 Intervertebral disc disorders with radiculopathy, lumbar region: Secondary | ICD-10-CM

## 2021-09-25 DIAGNOSIS — J45909 Unspecified asthma, uncomplicated: Secondary | ICD-10-CM | POA: Insufficient documentation

## 2021-09-25 DIAGNOSIS — M4326 Fusion of spine, lumbar region: Secondary | ICD-10-CM | POA: Diagnosis not present

## 2021-09-25 HISTORY — PX: TRANSFORAMINAL LUMBAR INTERBODY FUSION (TLIF) WITH PEDICLE SCREW FIXATION 1 LEVEL: SHX6141

## 2021-09-25 LAB — ABO/RH: ABO/RH(D): B POS

## 2021-09-25 SURGERY — TRANSFORAMINAL LUMBAR INTERBODY FUSION (TLIF) WITH PEDICLE SCREW FIXATION 1 LEVEL
Anesthesia: General | Site: Spine Lumbar | Laterality: Right

## 2021-09-25 MED ORDER — ONDANSETRON HCL 4 MG/2ML IJ SOLN: 4.0000 mg | Freq: Four times a day (QID) | INTRAMUSCULAR | Status: DC | PRN

## 2021-09-25 MED ORDER — FENTANYL CITRATE (PF) 100 MCG/2ML IJ SOLN
25.0000 ug | INTRAMUSCULAR | Status: DC | PRN
  Administered 2021-09-25: 50 ug via INTRAVENOUS

## 2021-09-25 MED ORDER — TRAMADOL HCL 50 MG PO TABS
50.0000 mg | ORAL_TABLET | Freq: Four times a day (QID) | ORAL | Status: DC | PRN
  Administered 2021-09-25 – 2021-09-26 (×3): 100 mg via ORAL
  Filled 2021-09-25 (×3): qty 2

## 2021-09-25 MED ORDER — TAMSULOSIN HCL 0.4 MG PO CAPS
0.8000 mg | ORAL_CAPSULE | Freq: Every day | ORAL | Status: DC
  Administered 2021-09-25 – 2021-09-26 (×2): 0.8 mg via ORAL
  Filled 2021-09-25 (×2): qty 2

## 2021-09-25 MED ORDER — VANCOMYCIN HCL IN DEXTROSE 1-5 GM/200ML-% IV SOLN
1000.0000 mg | Freq: Once | INTRAVENOUS | Status: AC
  Administered 2021-09-25: 1000 mg via INTRAVENOUS
  Filled 2021-09-25: qty 200

## 2021-09-25 MED ORDER — PHENYLEPHRINE 80 MCG/ML (10ML) SYRINGE FOR IV PUSH (FOR BLOOD PRESSURE SUPPORT)
PREFILLED_SYRINGE | INTRAVENOUS | Status: AC
  Filled 2021-09-25: qty 10

## 2021-09-25 MED ORDER — ALUM & MAG HYDROXIDE-SIMETH 200-200-20 MG/5ML PO SUSP: 30.0000 mL | Freq: Four times a day (QID) | ORAL | Status: DC | PRN

## 2021-09-25 MED ORDER — METHOCARBAMOL 500 MG PO TABS
500.0000 mg | ORAL_TABLET | Freq: Four times a day (QID) | ORAL | Status: DC | PRN
  Administered 2021-09-25 – 2021-09-26 (×3): 500 mg via ORAL
  Filled 2021-09-25 (×3): qty 1

## 2021-09-25 MED ORDER — ROCURONIUM BROMIDE 10 MG/ML (PF) SYRINGE
PREFILLED_SYRINGE | INTRAVENOUS | Status: AC
  Filled 2021-09-25: qty 10

## 2021-09-25 MED ORDER — PHENYLEPHRINE HCL-NACL 20-0.9 MG/250ML-% IV SOLN
INTRAVENOUS | Status: DC | PRN
  Administered 2021-09-25: 25 ug/min via INTRAVENOUS

## 2021-09-25 MED ORDER — BUPIVACAINE-EPINEPHRINE 0.25% -1:200000 IJ SOLN
INTRAMUSCULAR | Status: DC | PRN
  Administered 2021-09-25: 9 mL
  Administered 2021-09-25: 20 mL

## 2021-09-25 MED ORDER — SENNOSIDES-DOCUSATE SODIUM 8.6-50 MG PO TABS: 1.0000 | ORAL_TABLET | Freq: Every evening | ORAL | Status: DC | PRN

## 2021-09-25 MED ORDER — ONDANSETRON HCL 4 MG PO TABS: 4.0000 mg | ORAL_TABLET | Freq: Four times a day (QID) | ORAL | Status: DC | PRN

## 2021-09-25 MED ORDER — FENTANYL CITRATE (PF) 100 MCG/2ML IJ SOLN
INTRAMUSCULAR | Status: AC
  Filled 2021-09-25: qty 2

## 2021-09-25 MED ORDER — LIDOCAINE 2% (20 MG/ML) 5 ML SYRINGE
INTRAMUSCULAR | Status: AC
  Filled 2021-09-25: qty 5

## 2021-09-25 MED ORDER — ONDANSETRON HCL 4 MG/2ML IJ SOLN
INTRAMUSCULAR | Status: AC
  Filled 2021-09-25: qty 2

## 2021-09-25 MED ORDER — PROPOFOL 10 MG/ML IV BOLUS
INTRAVENOUS | Status: DC | PRN
Start: 2021-09-25 — End: 2021-09-25
  Administered 2021-09-25 (×2): 30 mg via INTRAVENOUS
  Administered 2021-09-25: 20 mg via INTRAVENOUS
  Administered 2021-09-25: 30 mg via INTRAVENOUS
  Administered 2021-09-25: 150 mg via INTRAVENOUS

## 2021-09-25 MED ORDER — BUPIVACAINE-EPINEPHRINE (PF) 0.25% -1:200000 IJ SOLN
INTRAMUSCULAR | Status: AC
  Filled 2021-09-25: qty 30

## 2021-09-25 MED ORDER — ADULT MULTIVITAMIN W/MINERALS CH
1.0000 | ORAL_TABLET | Freq: Every day | ORAL | Status: DC
  Administered 2021-09-26: 1 via ORAL
  Filled 2021-09-25: qty 1

## 2021-09-25 MED ORDER — CARVEDILOL 12.5 MG PO TABS
12.5000 mg | ORAL_TABLET | Freq: Two times a day (BID) | ORAL | Status: DC
  Administered 2021-09-25 – 2021-09-26 (×2): 12.5 mg via ORAL
  Filled 2021-09-25 (×2): qty 1

## 2021-09-25 MED ORDER — LIDOCAINE 2% (20 MG/ML) 5 ML SYRINGE
INTRAMUSCULAR | Status: DC | PRN
  Administered 2021-09-25: 60 mg via INTRAVENOUS

## 2021-09-25 MED ORDER — CHLORHEXIDINE GLUCONATE 0.12 % MT SOLN
OROMUCOSAL | Status: AC
  Administered 2021-09-25: 15 mL via OROMUCOSAL
  Filled 2021-09-25: qty 15

## 2021-09-25 MED ORDER — METHOCARBAMOL 500 MG PO TABS: 500.0000 mg | ORAL_TABLET | Freq: Four times a day (QID) | ORAL | 2 refills | Status: DC | PRN

## 2021-09-25 MED ORDER — FLUTICASONE FUROATE-VILANTEROL 200-25 MCG/ACT IN AEPB
1.0000 | INHALATION_SPRAY | Freq: Every day | RESPIRATORY_TRACT | Status: DC
  Filled 2021-09-25: qty 28

## 2021-09-25 MED ORDER — FENTANYL CITRATE (PF) 250 MCG/5ML IJ SOLN
INTRAMUSCULAR | Status: DC | PRN
  Administered 2021-09-25: 50 ug via INTRAVENOUS
  Administered 2021-09-25: 100 ug via INTRAVENOUS
  Administered 2021-09-25: 50 ug via INTRAVENOUS

## 2021-09-25 MED ORDER — EPHEDRINE SULFATE-NACL 50-0.9 MG/10ML-% IV SOSY
PREFILLED_SYRINGE | INTRAVENOUS | Status: DC | PRN
  Administered 2021-09-25: 5 mg via INTRAVENOUS
  Administered 2021-09-25: 10 mg via INTRAVENOUS
  Administered 2021-09-25: 5 mg via INTRAVENOUS

## 2021-09-25 MED ORDER — GELATIN ABSORBABLE 100 EX MISC: CUTANEOUS | Status: DC | PRN

## 2021-09-25 MED ORDER — OXYCODONE HCL 5 MG PO TABS
ORAL_TABLET | ORAL | Status: AC
  Filled 2021-09-25: qty 1

## 2021-09-25 MED ORDER — ACETAMINOPHEN 650 MG RE SUPP: 650.0000 mg | RECTAL | Status: DC | PRN

## 2021-09-25 MED ORDER — SODIUM CHLORIDE 0.9% FLUSH: 3.0000 mL | INTRAVENOUS | Status: DC | PRN

## 2021-09-25 MED ORDER — PHENOL 1.4 % MT LIQD: 1.0000 | OROMUCOSAL | Status: DC | PRN

## 2021-09-25 MED ORDER — MENTHOL 3 MG MT LOZG: 1.0000 | LOZENGE | OROMUCOSAL | Status: DC | PRN

## 2021-09-25 MED ORDER — ONDANSETRON HCL 4 MG/2ML IJ SOLN
INTRAMUSCULAR | Status: DC | PRN
  Administered 2021-09-25: 4 mg via INTRAVENOUS

## 2021-09-25 MED ORDER — THROMBIN 20000 UNITS EX SOLR: CUTANEOUS | Status: DC | PRN

## 2021-09-25 MED ORDER — CHLORHEXIDINE GLUCONATE 0.12 % MT SOLN: 15.0000 mL | Freq: Once | OROMUCOSAL | Status: AC

## 2021-09-25 MED ORDER — HYDROCODONE-ACETAMINOPHEN 5-325 MG PO TABS: 1.0000 | ORAL_TABLET | ORAL | Status: DC | PRN

## 2021-09-25 MED ORDER — BUPIVACAINE LIPOSOME 1.3 % IJ SUSP
INTRAMUSCULAR | Status: AC
  Filled 2021-09-25: qty 20

## 2021-09-25 MED ORDER — SUGAMMADEX SODIUM 500 MG/5ML IV SOLN
INTRAVENOUS | Status: AC
  Filled 2021-09-25: qty 5

## 2021-09-25 MED ORDER — ACETAMINOPHEN 500 MG PO TABS
ORAL_TABLET | ORAL | Status: AC
  Administered 2021-09-25: 1000 mg via ORAL
  Filled 2021-09-25: qty 2

## 2021-09-25 MED ORDER — IRBESARTAN 150 MG PO TABS
150.0000 mg | ORAL_TABLET | Freq: Every day | ORAL | Status: DC
Start: 2021-09-25 — End: 2021-09-26
  Administered 2021-09-25 – 2021-09-26 (×2): 150 mg via ORAL
  Filled 2021-09-25 (×2): qty 1

## 2021-09-25 MED ORDER — ACETAMINOPHEN 325 MG PO TABS: 650.0000 mg | ORAL_TABLET | ORAL | Status: DC | PRN

## 2021-09-25 MED ORDER — FENTANYL CITRATE (PF) 250 MCG/5ML IJ SOLN
INTRAMUSCULAR | Status: AC
  Filled 2021-09-25: qty 5

## 2021-09-25 MED ORDER — LACTATED RINGERS IV SOLN: INTRAVENOUS | Status: DC

## 2021-09-25 MED ORDER — FINASTERIDE 5 MG PO TABS
5.0000 mg | ORAL_TABLET | Freq: Every day | ORAL | Status: DC
  Administered 2021-09-26: 5 mg via ORAL
  Filled 2021-09-25: qty 1

## 2021-09-25 MED ORDER — ROSUVASTATIN CALCIUM 20 MG PO TABS
20.0000 mg | ORAL_TABLET | Freq: Every day | ORAL | Status: DC
  Administered 2021-09-26: 20 mg via ORAL
  Filled 2021-09-25: qty 1

## 2021-09-25 MED ORDER — OXYCODONE HCL 5 MG/5ML PO SOLN: 5.0000 mg | Freq: Once | ORAL | Status: AC | PRN

## 2021-09-25 MED ORDER — BUPIVACAINE LIPOSOME 1.3 % IJ SUSP
INTRAMUSCULAR | Status: DC | PRN
  Administered 2021-09-25: 20 mL

## 2021-09-25 MED ORDER — POTASSIUM CHLORIDE IN NACL 20-0.9 MEQ/L-% IV SOLN: INTRAVENOUS | Status: DC

## 2021-09-25 MED ORDER — METHOCARBAMOL 1000 MG/10ML IJ SOLN
500.0000 mg | Freq: Four times a day (QID) | INTRAVENOUS | Status: DC | PRN
  Filled 2021-09-25: qty 5

## 2021-09-25 MED ORDER — PHENYLEPHRINE 80 MCG/ML (10ML) SYRINGE FOR IV PUSH (FOR BLOOD PRESSURE SUPPORT)
PREFILLED_SYRINGE | INTRAVENOUS | Status: DC | PRN
  Administered 2021-09-25 (×2): 80 ug via INTRAVENOUS

## 2021-09-25 MED ORDER — IBUPROFEN 200 MG PO TABS
600.0000 mg | ORAL_TABLET | Freq: Every morning | ORAL | Status: DC
  Administered 2021-09-26: 600 mg via ORAL
  Filled 2021-09-25: qty 3

## 2021-09-25 MED ORDER — PROPOFOL 10 MG/ML IV BOLUS
INTRAVENOUS | Status: AC
  Filled 2021-09-25: qty 20

## 2021-09-25 MED ORDER — SODIUM CHLORIDE 0.9 % IV SOLN
250.0000 mL | INTRAVENOUS | Status: DC
  Administered 2021-09-25: 250 mL via INTRAVENOUS

## 2021-09-25 MED ORDER — FLEET ENEMA 7-19 GM/118ML RE ENEM: 1.0000 | ENEMA | Freq: Once | RECTAL | Status: DC | PRN

## 2021-09-25 MED ORDER — POVIDONE-IODINE 7.5 % EX SOLN: Freq: Once | CUTANEOUS | Status: AC

## 2021-09-25 MED ORDER — ONDANSETRON HCL 4 MG/2ML IJ SOLN: 4.0000 mg | Freq: Once | INTRAMUSCULAR | Status: DC | PRN

## 2021-09-25 MED ORDER — VANCOMYCIN HCL IN DEXTROSE 1-5 GM/200ML-% IV SOLN: 1000.0000 mg | INTRAVENOUS | Status: AC

## 2021-09-25 MED ORDER — OXYCODONE HCL 5 MG PO TABS
5.0000 mg | ORAL_TABLET | Freq: Once | ORAL | Status: AC | PRN
  Administered 2021-09-25: 5 mg via ORAL

## 2021-09-25 MED ORDER — ORAL CARE MOUTH RINSE: 15.0000 mL | Freq: Once | OROMUCOSAL | Status: AC

## 2021-09-25 MED ORDER — AMITRIPTYLINE HCL 25 MG PO TABS
25.0000 mg | ORAL_TABLET | Freq: Every day | ORAL | Status: DC
  Administered 2021-09-25: 25 mg via ORAL
  Filled 2021-09-25: qty 1

## 2021-09-25 MED ORDER — VANCOMYCIN HCL IN DEXTROSE 1-5 GM/200ML-% IV SOLN
INTRAVENOUS | Status: AC
  Administered 2021-09-25: 1000 mg via INTRAVENOUS
  Filled 2021-09-25: qty 200

## 2021-09-25 MED ORDER — ROCURONIUM BROMIDE 10 MG/ML (PF) SYRINGE
PREFILLED_SYRINGE | INTRAVENOUS | Status: DC | PRN
Start: 2021-09-25 — End: 2021-09-25
  Administered 2021-09-25: 20 mg via INTRAVENOUS
  Administered 2021-09-25: 30 mg via INTRAVENOUS
  Administered 2021-09-25: 80 mg via INTRAVENOUS

## 2021-09-25 MED ORDER — POLYVINYL ALCOHOL 1.4 % OP SOLN
Freq: Two times a day (BID) | OPHTHALMIC | Status: DC | PRN
Start: 2021-09-25 — End: 2021-09-26
  Filled 2021-09-25: qty 15

## 2021-09-25 MED ORDER — PANTOPRAZOLE SODIUM 40 MG PO TBEC
80.0000 mg | DELAYED_RELEASE_TABLET | Freq: Every day | ORAL | Status: DC
Start: 2021-09-25 — End: 2021-09-26
  Administered 2021-09-25 – 2021-09-26 (×2): 80 mg via ORAL
  Filled 2021-09-25 (×2): qty 2

## 2021-09-25 MED ORDER — DEXAMETHASONE SODIUM PHOSPHATE 10 MG/ML IJ SOLN
INTRAMUSCULAR | Status: DC | PRN
  Administered 2021-09-25: 4 mg via INTRAVENOUS

## 2021-09-25 MED ORDER — THROMBIN 20000 UNITS EX SOLR
CUTANEOUS | Status: AC
  Filled 2021-09-25: qty 20000

## 2021-09-25 MED ORDER — ALBUMIN HUMAN 5 % IV SOLN: INTRAVENOUS | Status: DC | PRN

## 2021-09-25 MED ORDER — DEXAMETHASONE SODIUM PHOSPHATE 10 MG/ML IJ SOLN
INTRAMUSCULAR | Status: AC
  Filled 2021-09-25: qty 1

## 2021-09-25 MED ORDER — SODIUM CHLORIDE 0.9% FLUSH
3.0000 mL | Freq: Two times a day (BID) | INTRAVENOUS | Status: DC
  Administered 2021-09-25 – 2021-09-26 (×3): 3 mL via INTRAVENOUS

## 2021-09-25 MED ORDER — FLUTICASONE PROPIONATE 50 MCG/ACT NA SUSP
2.0000 | Freq: Every day | NASAL | Status: DC
  Administered 2021-09-26: 2 via NASAL
  Filled 2021-09-25: qty 16

## 2021-09-25 MED ORDER — ZOLPIDEM TARTRATE 5 MG PO TABS: 5.0000 mg | ORAL_TABLET | Freq: Every evening | ORAL | Status: DC | PRN

## 2021-09-25 MED ORDER — 0.9 % SODIUM CHLORIDE (POUR BTL) OPTIME
TOPICAL | Status: DC | PRN
  Administered 2021-09-25: 1000 mL

## 2021-09-25 MED ORDER — BISACODYL 5 MG PO TBEC: 5.0000 mg | DELAYED_RELEASE_TABLET | Freq: Every day | ORAL | Status: DC | PRN

## 2021-09-25 MED ORDER — DOCUSATE SODIUM 100 MG PO CAPS
100.0000 mg | ORAL_CAPSULE | Freq: Two times a day (BID) | ORAL | Status: DC
  Administered 2021-09-25 – 2021-09-26 (×3): 100 mg via ORAL
  Filled 2021-09-25 (×3): qty 1

## 2021-09-25 MED ORDER — ACETAMINOPHEN 500 MG PO TABS: 1000.0000 mg | ORAL_TABLET | Freq: Once | ORAL | Status: AC

## 2021-09-25 MED ORDER — HYDROMORPHONE HCL 1 MG/ML IJ SOLN: 0.5000 mg | INTRAMUSCULAR | Status: DC | PRN

## 2021-09-25 MED ORDER — EPHEDRINE 5 MG/ML INJ
INTRAVENOUS | Status: AC
  Filled 2021-09-25: qty 5

## 2021-09-25 SURGICAL SUPPLY — 92 items
BAG COUNTER SPONGE SURGICOUNT (BAG) ×2 IMPLANT
BENZOIN TINCTURE PRP APPL 2/3 (GAUZE/BANDAGES/DRESSINGS) ×2 IMPLANT
BLADE CLIPPER SURG (BLADE) IMPLANT
BONE VIVIGEN FORMABLE 1.3CC (Bone Implant) ×2 IMPLANT
BUR PRESCISION 1.7 ELITE (BURR) ×2 IMPLANT
BUR ROUND FLUTED 5 RND (BURR) ×2 IMPLANT
BUR ROUND PRECISION 4.0 (BURR) IMPLANT
BUR SABER RD CUTTING 3.0 (BURR) IMPLANT
CAGE SABLE 10X30 6-12 8D (Cage) ×1 IMPLANT
CANNULA GRAFT BNE VG PRE-FILL (Bone Implant) IMPLANT
CARTRIDGE OIL MAESTRO DRILL (MISCELLANEOUS) ×1 IMPLANT
CNTNR URN SCR LID CUP LEK RST (MISCELLANEOUS) ×1 IMPLANT
CONT SPEC 4OZ STRL OR WHT (MISCELLANEOUS) ×1
COVER BACK TABLE 60X90IN (DRAPES) ×2 IMPLANT
COVER MAYO STAND STRL (DRAPES) ×4 IMPLANT
COVER SURGICAL LIGHT HANDLE (MISCELLANEOUS) ×2 IMPLANT
DIFFUSER DRILL AIR PNEUMATIC (MISCELLANEOUS) ×2 IMPLANT
DISPENSER GRAFT BNE VG (MISCELLANEOUS) IMPLANT
DISPENSER VIVIGEN BONE GRAFT (MISCELLANEOUS) ×2 IMPLANT
DRAIN CHANNEL 15F RND FF W/TCR (WOUND CARE) IMPLANT
DRAPE C-ARM 42X72 X-RAY (DRAPES) ×2 IMPLANT
DRAPE C-ARMOR (DRAPES) IMPLANT
DRAPE POUCH INSTRU U-SHP 10X18 (DRAPES) ×2 IMPLANT
DRAPE SURG 17X23 STRL (DRAPES) ×8 IMPLANT
DURAPREP 26ML APPLICATOR (WOUND CARE) ×2 IMPLANT
ELECT BLADE 4.0 EZ CLEAN MEGAD (MISCELLANEOUS) ×2
ELECT CAUTERY BLADE 6.4 (BLADE) ×2 IMPLANT
ELECT PENCIL ROCKER SW 15FT (MISCELLANEOUS) ×1 IMPLANT
ELECT REM PT RETURN 9FT ADLT (ELECTROSURGICAL) ×2
ELECTRODE BLDE 4.0 EZ CLN MEGD (MISCELLANEOUS) ×1 IMPLANT
ELECTRODE REM PT RTRN 9FT ADLT (ELECTROSURGICAL) ×1 IMPLANT
EVACUATOR SILICONE 100CC (DRAIN) IMPLANT
FILTER STRAW FLUID ASPIR (MISCELLANEOUS) ×2 IMPLANT
GAUZE 4X4 16PLY ~~LOC~~+RFID DBL (SPONGE) ×2 IMPLANT
GAUZE SPONGE 4X4 12PLY STRL (GAUZE/BANDAGES/DRESSINGS) ×2 IMPLANT
GLOVE BIO SURGEON STRL SZ7 (GLOVE) ×2 IMPLANT
GLOVE BIO SURGEON STRL SZ8 (GLOVE) ×2 IMPLANT
GLOVE BIOGEL PI IND STRL 7.0 (GLOVE) ×1 IMPLANT
GLOVE BIOGEL PI IND STRL 8 (GLOVE) ×1 IMPLANT
GLOVE BIOGEL PI INDICATOR 7.0 (GLOVE) ×1
GLOVE BIOGEL PI INDICATOR 8 (GLOVE) ×1
GLOVE SURG ENC MOIS LTX SZ6.5 (GLOVE) ×2 IMPLANT
GOWN STRL REUS W/ TWL LRG LVL3 (GOWN DISPOSABLE) ×2 IMPLANT
GOWN STRL REUS W/ TWL XL LVL3 (GOWN DISPOSABLE) ×1 IMPLANT
GOWN STRL REUS W/TWL LRG LVL3 (GOWN DISPOSABLE) ×2
GOWN STRL REUS W/TWL XL LVL3 (GOWN DISPOSABLE) ×1
GRAFT BNE MATRIX VG FRMBL SM 1 (Bone Implant) IMPLANT
GRAFT BONE CANNULA VIVIGEN 3 (Bone Implant) ×6 IMPLANT
IV CATH 14GX2 1/4 (CATHETERS) ×2 IMPLANT
KIT BASIN OR (CUSTOM PROCEDURE TRAY) ×2 IMPLANT
KIT POSITION SURG JACKSON T1 (MISCELLANEOUS) ×2 IMPLANT
KIT TURNOVER KIT B (KITS) ×2 IMPLANT
MARKER SKIN DUAL TIP RULER LAB (MISCELLANEOUS) ×4 IMPLANT
NDL 18GX1X1/2 (RX/OR ONLY) (NEEDLE) ×1 IMPLANT
NDL HYPO 25GX1X1/2 BEV (NEEDLE) ×1 IMPLANT
NDL SPNL 18GX3.5 QUINCKE PK (NEEDLE) ×2 IMPLANT
NEEDLE 18GX1X1/2 (RX/OR ONLY) (NEEDLE) ×2 IMPLANT
NEEDLE 22X1 1/2 (OR ONLY) (NEEDLE) ×4 IMPLANT
NEEDLE HYPO 25GX1X1/2 BEV (NEEDLE) ×2 IMPLANT
NEEDLE SPNL 18GX3.5 QUINCKE PK (NEEDLE) ×4 IMPLANT
NS IRRIG 1000ML POUR BTL (IV SOLUTION) ×2 IMPLANT
OIL CARTRIDGE MAESTRO DRILL (MISCELLANEOUS) ×2
PACK LAMINECTOMY ORTHO (CUSTOM PROCEDURE TRAY) ×2 IMPLANT
PACK UNIVERSAL I (CUSTOM PROCEDURE TRAY) ×2 IMPLANT
PAD ARMBOARD 7.5X6 YLW CONV (MISCELLANEOUS) ×4 IMPLANT
PATTIES SURGICAL .5 X1 (DISPOSABLE) ×2 IMPLANT
PATTIES SURGICAL .5X1.5 (GAUZE/BANDAGES/DRESSINGS) ×2 IMPLANT
ROD PRE BENT EXP 40MM (Rod) ×1 IMPLANT
ROD PRE BENT EXPEDIUM 35MM (Rod) ×1 IMPLANT
SCREW SET SINGLE INNER (Screw) ×4 IMPLANT
SCREW VIPER CORT FIX 6.00X30 (Screw) ×1 IMPLANT
SCREW VIPER CORT FIX 6X35 (Screw) ×3 IMPLANT
SPONGE INTESTINAL PEANUT (DISPOSABLE) ×2 IMPLANT
SPONGE SURGIFOAM ABS GEL 100 (HEMOSTASIS) ×2 IMPLANT
STRIP CLOSURE SKIN 1/2X4 (GAUZE/BANDAGES/DRESSINGS) ×3 IMPLANT
SURGIFLO W/THROMBIN 8M KIT (HEMOSTASIS) IMPLANT
SUT MNCRL AB 4-0 PS2 18 (SUTURE) ×2 IMPLANT
SUT VIC AB 0 CT1 18XCR BRD 8 (SUTURE) ×1 IMPLANT
SUT VIC AB 0 CT1 8-18 (SUTURE) ×1
SUT VIC AB 1 CT1 18XCR BRD 8 (SUTURE) ×1 IMPLANT
SUT VIC AB 1 CT1 8-18 (SUTURE) ×1
SUT VIC AB 2-0 CT2 18 VCP726D (SUTURE) ×2 IMPLANT
SYR 20ML LL LF (SYRINGE) ×4 IMPLANT
SYR BULB IRRIG 60ML STRL (SYRINGE) ×2 IMPLANT
SYR CONTROL 10ML LL (SYRINGE) ×4 IMPLANT
SYR TB 1ML LUER SLIP (SYRINGE) ×2 IMPLANT
TAP EXPEDIUM DL 4.35 (INSTRUMENTS) ×1 IMPLANT
TAP EXPEDIUM DL 5.0 (INSTRUMENTS) ×1 IMPLANT
TAP EXPEDIUM DL 6.0 (INSTRUMENTS) ×1 IMPLANT
TRAY FOLEY MTR SLVR 16FR STAT (SET/KITS/TRAYS/PACK) ×2 IMPLANT
WATER STERILE IRR 1000ML POUR (IV SOLUTION) ×2 IMPLANT
YANKAUER SUCT BULB TIP NO VENT (SUCTIONS) ×2 IMPLANT

## 2021-09-25 NOTE — Progress Notes (Addendum)
? ? ?  Please note this pt would like to try and use Tramadol and Robaxin for PO pain control as first line. He would like to avoid use of hydrocodone and stronger meds if possible. Please dose anti-nausea meds with pain pills. Thank you, ? ?Jason Coop, PA-C  ?Spine Surgery  ?

## 2021-09-25 NOTE — Transfer of Care (Signed)
Immediate Anesthesia Transfer of Care Note ? ?Patient: Jonathon Johnson ? ?Procedure(s) Performed: RIGHT-SIDED TRANSFORAMINAL LUMBAR INTERBODY FUSION AND DECOMPRESSION LUMBAR 4-  LUMBAR 5 WITH INSTRUMENTATION AND ALLOGRAFT (Right: Spine Lumbar) ? ?Patient Location: PACU ? ?Anesthesia Type:General ? ?Level of Consciousness: awake, oriented and patient cooperative ? ?Airway & Oxygen Therapy: Patient Spontanous Breathing and Patient connected to nasal cannula oxygen ? ?Post-op Assessment: Report given to RN and Post -op Vital signs reviewed and stable ? ?Post vital signs: Reviewed ? ?Last Vitals:  ?Vitals Value Taken Time  ?BP 134/87 09/25/21 1240  ?Temp    ?Pulse 92 09/25/21 1242  ?Resp 15 09/25/21 1242  ?SpO2 97 % 09/25/21 1242  ?Vitals shown include unvalidated device data. ? ?Last Pain:  ?Vitals:  ? 09/25/21 0737  ?TempSrc:   ?PainSc: 0-No pain  ?   ? ?  ? ?Complications: No notable events documented. ?

## 2021-09-25 NOTE — Anesthesia Postprocedure Evaluation (Signed)
Anesthesia Post Note ? ?Patient: Gervis Gaba ? ?Procedure(s) Performed: RIGHT-SIDED TRANSFORAMINAL LUMBAR INTERBODY FUSION AND DECOMPRESSION LUMBAR 4-  LUMBAR 5 WITH INSTRUMENTATION AND ALLOGRAFT (Right: Spine Lumbar) ? ?  ? ?Patient location during evaluation: PACU ?Anesthesia Type: General ?Level of consciousness: awake and alert ?Pain management: pain level controlled ?Vital Signs Assessment: post-procedure vital signs reviewed and stable ?Respiratory status: spontaneous breathing, nonlabored ventilation and respiratory function stable ?Cardiovascular status: stable and blood pressure returned to baseline ?Anesthetic complications: no ? ? ?No notable events documented. ? ?Last Vitals:  ?Vitals:  ? 09/25/21 1340 09/25/21 1619  ?BP: 126/77 (!) 152/99  ?Pulse: 91 97  ?Resp: 12 18  ?Temp: 37 ?C 36.6 ?C  ?SpO2: 94% 99%  ?  ?Last Pain:  ?Vitals:  ? 09/25/21 1340  ?TempSrc:   ?PainSc: 3   ? ? ?  ?  ?  ?  ?  ?  ? ?Beryle Lathe ? ? ? ? ?

## 2021-09-25 NOTE — Progress Notes (Signed)
Pharmacy Antibiotic Note ? ?Jonathon Johnson is a 76 y.o. male admitted on 09/25/2021 with surgical prophylaxis.  Pharmacy has been consulted for vancomycin x 1 dose. ? ?Plan: ?Vancomycin 1g x 1. ? ?Height: 5\' 7"  (170.2 cm) ?Weight: 81.9 kg (180 lb 9.6 oz) ?IBW/kg (Calculated) : 66.1 ? ?Temp (24hrs), Avg:98.3 ?F (36.8 ?C), Min:98 ?F (36.7 ?C), Max:98.6 ?F (37 ?C) ? ?No results for input(s): WBC, CREATININE, LATICACIDVEN, VANCOTROUGH, VANCOPEAK, VANCORANDOM, GENTTROUGH, GENTPEAK, GENTRANDOM, TOBRATROUGH, TOBRAPEAK, TOBRARND, AMIKACINPEAK, AMIKACINTROU, AMIKACIN in the last 168 hours.  ?Estimated Creatinine Clearance: 71 mL/min (by C-G formula based on SCr of 0.92 mg/dL).   ? ?Allergies  ?Allergen Reactions  ? Amoxicillin Diarrhea  ? Morphine And Related Nausea And Vomiting  ? ? ? , Pharm D, BCPS, BCCP ?Clinical Pharmacist ? 09/25/2021 2:29 PM  ? ?[facility] pharmacy phone numbers are listed on [email or web address] ? ? ?

## 2021-09-25 NOTE — Op Note (Signed)
PATIENT NAME: Jonathon Johnson  ? ?MEDICAL RECORD NO.:   [id number]  ? ?DATE OF BIRTH: May 19, 1946 ?  ?DATE OF PROCEDURE: 09/25/2021  ?  ?                            OPERATIVE REPORT ?  ?  ?PREOPERATIVE DIAGNOSES: ?1. Bilateral lumbar radiculopathy. ?2. L4-5 spinal stenosis. ?3. L4-5 degenerative disc disease and spondylolisthesis ?  ?POSTOPERATIVE DIAGNOSES: ?1. Bilateral lumbar radiculopathy. ?2. L4-5 spinal stenosis. ?3. L4-5 degenerative disc disease and spondylolisthesis ?  ?PROCEDURES: ?1. L4/5 decompression ?2. Right-sided L4-5 transforaminal lumbar interbody fusion. ?3. Left-sided L4-5 posterolateral fusion. ?4. Insertion of interbody device x1 (Globus expandable intervertebral spacer). ?5. Placement of segmental posterior instrumentation L4, L5 bilaterally  ?6. Use of local autograft. ?7. Use of morselized allograft - Vivigen ?8. Intraoperative use of fluoroscopy. ?  ?SURGEON:  Phylliss Bob, MD. ?  ?ASSISTANT:  Pricilla Holm, PA-C. ?  ?ANESTHESIA:  General endotracheal anesthesia. ?  ?COMPLICATIONS:  None. ?  ?DISPOSITION:  Stable. ?  ?ESTIMATED BLOOD LOSS:  100cc ?  ?INDICATIONS FOR SURGERY:  Briefly,  Jonathon Johnson is a pleasant 76 year old male who did present to me with severe and ongoing pain in the right and left ?legs. I did feel that the symptoms were secondary to the findings noted above.   ?The patient failed conservative care and did wish to proceed with the procedure  ?noted above. ?  ?OPERATIVE DETAILS:  On 09/25/2021, the patient was brought to ?surgery and general endotracheal anesthesia was administered.  The ?patient was placed prone on a well-padded flat Jackson bed with a spinal ?frame.  Antibiotics were given and a time-out procedure was performed. ?The back was prepped and draped in the usual fashion.  A midline ?incision was made overlying the L4-5 intervertebral spaces.  The fascia ?was incised at the midline.  The paraspinal musculature was bluntly ?swept laterally.  Anatomic landmarks for  the pedicles were exposed. ?Using fluoroscopy, I did cannulate the L4 and L5 pedicles bilaterally, ?using a medial to lateral cortical trajectory technique.  At this point, 6 x 30 mm screws were placed into the left pedicles, and a 40 mm rod was placed into the tulip heads of the screw, and caps were also placed.  Distraction was then applied across the L4-5 intervertebral space, and the caps were then provisionally tightened.  On the right side, bone wax was placed into the cannulated pedicle holes.  I then proceeded with the decompressive aspect of the procedure at the L4-5 level.  A partial facetectomy was performed bilaterally at L4-5, decompressing the L4-5 intervertebral space.  I was very pleased with the decompression. With an assistant holding medial retraction of the traversing right L5 nerve, I did perform an annulotomy at the ?posterolateral aspect of the L4-5 intervertebral space.  I then used a ?series of curettes and pituitary rongeurs to perform a thorough and ?complete intervertebral diskectomy.  The intervertebral space was then ?liberally packed with autograft as well as allograft in the form of ?Vivigen, as was the appropriate-sized intervertebral spacer.  The spacer ?was then tamped into position in the usual fashion, and expanded to approximately 11 mm in height. I was very pleased ?with the press-fit of the spacer.  I then placed 6 mm screws on the ?right at L4 and L5. A 40-mm rod was then placed and caps were placed. The distraction was then released on the contralateral side.  All caps  were then locked.  The wound was copiously irrigated with a total ?of approximately 3 L prior to placing the bone graft.  Additional ?autograft and allograft was then packed into the posterolateral gutter ?on the left side to help aid in the success of the fusion.  The wound was  ?explored for any undue bleeding and there was no substantial bleeding encountered.  Gel-Foam was placed over ?the laminectomy site.   The wound was then closed in layers using #1 ?Vicryl followed by 2-0 Vicryl, followed by 4-0 Monocryl.  Benzoin and ?Steri-Strips were applied followed by sterile dressing.  ?  ?  ?Of note, Pricilla Holm was my assistant throughout surgery, and did aid ?in retraction, suctioning, placement of the hardware, and closure. ?  ?  ?  ?Phylliss Bob, MD  ?

## 2021-09-25 NOTE — H&P (Signed)
? ? ? ?PREOPERATIVE H&P ? ?Chief Complaint: Bilateral leg pain ? ?HPI: ?Jonathon Johnson is a 76 y.o. male who presents with ongoing pain in the bilateral legs ? ?MRI reveals severe stenosis and instability at L4/54 ? ?Patient has failed multiple forms of conservative care and continues to have pain (see office notes for additional details regarding the patient's full course of treatment) ? ?Past Medical History:  ?Diagnosis Date  ? Asthma   ? Chicken pox   ? GERD (gastroesophageal reflux disease)   ? Hyperlipidemia   ? Hypertension   ? ?Past Surgical History:  ?Procedure Laterality Date  ? CHOLECYSTECTOMY    ? ?Social History  ? ?Socioeconomic History  ? Marital status: Married  ?  Spouse name: Not on file  ? Number of children: Not on file  ? Years of education: Not on file  ? Highest education level: Not on file  ?Occupational History  ? Not on file  ?Tobacco Use  ? Smoking status: Former  ?  Types: Cigarettes  ?  Quit date: 07/19/2010  ?  Years since quitting: 11.1  ? Smokeless tobacco: Never  ?Vaping Use  ? Vaping Use: Never used  ?Substance and Sexual Activity  ? Alcohol use: Yes  ?  Alcohol/week: 14.0 standard drinks  ?  Types: 14 Glasses of wine per week  ?  Comment: 3-4 glasses of wine daily  ? Drug use: Never  ? Sexual activity: Yes  ?  Partners: Female  ?Other Topics Concern  ? Not on file  ?Social History Narrative  ? Right handed  ? ?Social Determinants of Health  ? ?Financial Resource Strain: Low Risk   ? Difficulty of Paying Living Expenses: Not hard at all  ?Food Insecurity: No Food Insecurity  ? Worried About Charity fundraiser in the Last Year: Never true  ? Ran Out of Food in the Last Year: Never true  ?Transportation Needs: No Transportation Needs  ? Lack of Transportation (Medical): No  ? Lack of Transportation (Non-Medical): No  ?Physical Activity: Sufficiently Active  ? Days of Exercise per Week: 3 days  ? Minutes of Exercise per Session: 60 min  ?Stress: No Stress Concern Present  ? Feeling of  Stress : Only a little  ?Social Connections: Moderately Integrated  ? Frequency of Communication with Friends and Family: More than three times a week  ? Frequency of Social Gatherings with Friends and Family: Once a week  ? Attends Religious Services: 1 to 4 times per year  ? Active Member of Clubs or Organizations: No  ? Attends Archivist Meetings: Never  ? Marital Status: Married  ? ?Family History  ?Problem Relation Age of Onset  ? Alcohol abuse Mother   ? Depression Mother   ? Diabetes Mother   ? Alcohol abuse Father   ? COPD Father   ? Cancer Sister   ? Hypertension Brother   ? Migraines Daughter   ? Multiple sclerosis Daughter   ? ?Allergies  ?Allergen Reactions  ? Amoxicillin Diarrhea  ? Morphine And Related Nausea And Vomiting  ? ?Prior to Admission medications   ?Medication Sig Start Date End Date Taking? Authorizing Provider  ?amitriptyline (ELAVIL) 25 MG tablet TAKE 1 TABLET AT BEDTIME 11/01/20  Yes Libby Maw, MD  ?carvedilol (COREG) 12.5 MG tablet TAKE 1 TABLET TWICE A DAY WITH A MEAL (KEEP OFFICE VISIT) 05/23/21  Yes Donato Heinz, MD  ?finasteride (PROSCAR) 5 MG tablet TAKE 1 TABLET DAILY 12/20/20  Yes Libby Maw, MD  ?fluticasone [facility]) 50 MCG/ACT nasal spray USE 2 SPRAYS IN [facility] NOSTRIL DAILY 07/12/20  Yes Dutch Quint B, FNP  ?ibuprofen (ADVIL) 600 MG tablet Take 600 mg by mouth in the morning.   Yes [provider]  ?Multiple Vitamin (MULTIVITAMIN) tablet Take 1 tablet by mouth daily.   Yes [provider]  ?omeprazole (PRILOSEC) 40 MG capsule TAKE 1 CAPSULE DAILY 05/27/21  Yes Libby Maw, MD  ?Propylene Glycol-Glycerin (SOOTHE OP) Place 1 drop into both eyes 2 (two) times daily as needed (dry/itchy eyes).   Yes [provider]  ?rosuvastatin (CRESTOR) 20 MG tablet Take 1 tablet (20 mg total) by mouth daily. 05/20/21  Yes Buford Dresser, MD  ?tamsulosin (FLOMAX) 0.4 MG CAPS capsule TAKE 2 CAPSULES DAILY  AFTER BREAKFAST 07/12/20  Yes Dutch Quint B, FNP  ?valsartan (DIOVAN) 160 MG tablet Take 1 tablet (160 mg total) by mouth daily. ?Patient taking differently: Take 160 mg by mouth at bedtime. 09/05/21  Yes Cleaver, [name], NP  ?Grant Ruts INHUB 250-50 MCG/ACT AEPB USE 1 INHALATION TWICE A DAY ?Patient taking differently: Inhale 1 puff into the lungs daily. 08/07/21  Yes Libby Maw, MD  ? ? ? ?All other systems have been reviewed and were otherwise negative with the exception of those mentioned in the HPI and as above. ? ?Physical Exam: ?Vitals:  ? 09/25/21 0705  ?BP: (!) 160/84  ?Pulse: 89  ?Resp: 18  ?Temp: 98 ?F (36.7 ?C)  ?SpO2: 98%  ? ? ?Body mass index is 28.29 kg/m?. ? ?General: Alert, no acute distress ?Cardiovascular: No pedal edema ?Respiratory: No cyanosis, no use of accessory musculature ?Skin: No lesions in the area of chief complaint ?Neurologic: Sensation intact distally ?Psychiatric: Patient is competent for consent with normal mood and affect ?Lymphatic: No axillary or cervical lymphadenopathy ? ? ?Assessment/Plan: ?1-1/2 year history of ongoing bilateral leg pain secondary to severe spinal stenosis and grade 1 spondylolisthesis at L4-L5 ?Plan for Procedure(s): ?RIGHT-SIDED TRANSFORAMINAL LUMBAR INTERBODY FUSION AND DECOMPRESSION LUMBAR 4-  LUMBAR 5 WITH INSTRUMENTATION AND ALLOGRAFT ? ? ?Norva Karvonen, MD ?09/25/2021 ?7:50 AM  ?

## 2021-09-25 NOTE — TOC Progression Note (Signed)
Transition of Care (TOC) - Progression Note  ? ? ?Patient Details  ?Name: Jonathon Johnson ?MRN: [id number] ?Date of Birth: [birth year] ? ?Transition of Care (TOC) CM/SW Contact  ?Marilu Favre, RN ?Phone Number: ?09/25/2021, 4:10 PM ? ?Clinical Narrative:    ? ? ?DR Lynann Bologna 's office has arranged home health services through [facility]  ?  ? ?3[facility] staff will provide any needed DME  ?  ? ?Expected Discharge Plan and Services ?  ?  ?  ?  ?  ?Expected Discharge Date: 09/26/21               ?  ?  ?  ?  ?  ?  ?  ?  ?  ?  ? ? ?Social Determinants of Health (SDOH) Interventions ?  ? ?Readmission Risk Interventions ?   ? View : No data to display.  ?  ?  ?  ? ? ?

## 2021-09-25 NOTE — Anesthesia Procedure Notes (Signed)
Procedure Name: Intubation ?Date/Time: 09/25/2021 8:41 AM ?Performed by: Jenne Campus, CRNA ?Pre-anesthesia Checklist: Patient identified, Emergency Drugs available, Suction available and Patient being monitored ?Patient Re-evaluated:Patient Re-evaluated prior to induction ?Oxygen Delivery Method: Circle System Utilized ?Preoxygenation: Pre-oxygenation with 100% oxygen ?Induction Type: IV induction ?Ventilation: Mask ventilation without difficulty ?Laryngoscope Size: Sabra Heck and 3 ?Grade View: Grade I ?Tube type: Oral ?Tube size: 7.5 mm ?Number of attempts: 1 ?Airway Equipment and Method: Stylet and Oral airway ?Placement Confirmation: ETT inserted through vocal cords under direct vision, positive ETCO2 and breath sounds checked- equal and bilateral ?Secured at: 22 cm ?Tube secured with: Tape ?Dental Injury: Teeth and Oropharynx as per pre-operative assessment  ? ? ? ? ?

## 2021-09-26 DIAGNOSIS — M5416 Radiculopathy, lumbar region: Secondary | ICD-10-CM | POA: Diagnosis not present

## 2021-09-26 DIAGNOSIS — J45909 Unspecified asthma, uncomplicated: Secondary | ICD-10-CM | POA: Diagnosis not present

## 2021-09-26 DIAGNOSIS — I10 Essential (primary) hypertension: Secondary | ICD-10-CM | POA: Diagnosis not present

## 2021-09-26 DIAGNOSIS — Z87891 Personal history of nicotine dependence: Secondary | ICD-10-CM | POA: Diagnosis not present

## 2021-09-26 DIAGNOSIS — M48061 Spinal stenosis, lumbar region without neurogenic claudication: Secondary | ICD-10-CM | POA: Diagnosis not present

## 2021-09-26 DIAGNOSIS — M4316 Spondylolisthesis, lumbar region: Secondary | ICD-10-CM | POA: Diagnosis not present

## 2021-09-26 MED ORDER — TRAMADOL HCL 50 MG PO TABS: 50.0000 mg | ORAL_TABLET | Freq: Four times a day (QID) | ORAL | 2 refills | Status: DC | PRN

## 2021-09-26 NOTE — Evaluation (Signed)
Occupational Therapy Evaluation ?Patient Details ?Name: Jonathon Johnson ?MRN: [id number] ?DOB: 1945/12/12 ?Today's Date: 09/26/2021 ? ? ?History of Present Illness 76 yo M adm for PLIF. Asthma, Hyperlipidemia, Hypertension  ? ?Clinical Impression ?  ?Patient admitted for the procedure above.  Currently completing ADL and in room mobility at his baseline.  No PT needed in the acute setting.  All precautions reviewed, and no further acute care needs.  His spouse will assist as needed.    ?   ? ?Recommendations for follow up therapy are one component of a multi-disciplinary discharge planning process, led by the attending physician.  Recommendations may be updated based on patient status, additional functional criteria and insurance authorization.  ? ?Follow Up Recommendations ? No OT follow up  ?  ?Assistance Recommended at Discharge Set up Supervision/Assistance  ?Patient can return home with the following   ? ?  ?Functional Status Assessment ? Patient has not had a recent decline in their functional status  ?Equipment Recommendations ? None recommended by OT  ?  ?Recommendations for Other Services   ? ? ?  ?Precautions / Restrictions Precautions ?Precautions: Back ?Precaution Booklet Issued: Yes (comment) ?Restrictions ?Weight Bearing Restrictions: No  ? ?  ? ?Mobility Bed Mobility ?Overal bed mobility: Modified Independent ?  ?  ?  ?  ?  ?  ?  ?  ? ?Transfers ?Overall transfer level: Modified independent ?  ?  ?  ?  ?  ?  ?  ?  ?  ?  ? ?  ?Balance Overall balance assessment: Mild deficits observed, not formally tested ?  ?  ?  ?  ?  ?  ?  ?  ?  ?  ?  ?  ?  ?  ?  ?  ?  ?  ?   ? ?ADL either performed or assessed with clinical judgement  ? ?ADL Overall ADL's : Modified independent ?  ?  ?  ?  ?  ?  ?  ?  ?  ?  ?  ?  ?  ?  ?  ?  ?  ?  ?  ?   ? ? ? ?Vision Baseline Vision/History: 1 Wears glasses ?Patient Visual Report: No change from baseline ?   ?   ?Perception Perception ?Perception: Not tested ?  ?Praxis Praxis ?Praxis:  Not tested ?  ? ?Pertinent Vitals/Pain Pain Assessment ?Pain Assessment: Faces ?Faces Pain Scale: Hurts a little bit ?Pain Location: low back ?Pain Descriptors / Indicators: Aching ?Pain Intervention(s): Monitored during session  ? ? ? ?Hand Dominance Right ?  ?Extremity/Trunk Assessment Upper Extremity Assessment ?Upper Extremity Assessment: Overall WFL for tasks assessed ?  ?Lower Extremity Assessment ?Lower Extremity Assessment: Overall WFL for tasks assessed ?  ?Cervical / Trunk Assessment ?Cervical / Trunk Assessment: Back Surgery ?  ?Communication Communication ?Communication: No difficulties ?  ?Cognition Arousal/Alertness: Awake/alert ?Behavior During Therapy: [facility] for tasks assessed/performed ?Overall Cognitive Status: Within Functional Limits for tasks assessed ?  ?  ?  ?  ?  ?  ?  ?  ?  ?  ?  ?  ?  ?  ?  ?  ?  ?  ?  ?General Comments    ? ?  ?Exercises   ?  ?Shoulder Instructions    ? ? ?Home Living Family/patient expects to be discharged to:: Private residence ?Living Arrangements: Spouse/significant other ?Available Help at Discharge: Family;Available 24 hours/day ?Type of Home: House ?Home Access: Stairs to enter ?Entrance  Stairs-Number of Steps: 2 ?Entrance Stairs-Rails: None ?Home Layout: One level ?  ?  ?Bathroom Shower/Tub: Tub/shower unit;Walk-in shower ?  ?Bathroom Toilet: Standard ?Bathroom Accessibility: Yes ?How Accessible: Accessible via walker ?Home Equipment: Cane - single point;Hand held shower head;Grab bars - tub/shower;Grab bars - toilet;Toilet riser ?  ?  ?  ? ?  ?Prior Functioning/Environment Prior Level of Function : Independent/Modified Independent ?  ?  ?  ?  ?  ?  ?  ?  ?  ? ?  ?  ?OT Problem List: Pain ?  ?   ?OT Treatment/Interventions:    ?  ?OT Goals(Current goals can be found in the care plan section) Acute Rehab OT Goals ?Patient Stated Goal: return home ?OT Goal Formulation: With patient ?Time For Goal Achievement: 09/30/21 ?Potential to Achieve Goals: Good  ?OT Frequency:    ?  ? ?Co-evaluation   ?  ?  ?  ?  ? ?  ?AM-PAC OT "6 Clicks" Daily Activity     ?Outcome Measure Help from another person eating meals?: None ?Help from another person taking care of personal grooming?: None ?Help from another person toileting, which includes using toliet, bedpan, or urinal?: None ?Help from another person bathing (including washing, rinsing, drying)?: None ?Help from another person to put on and taking off regular upper body clothing?: None ?Help from another person to put on and taking off regular lower body clothing?: None ?6 Click Score: 24 ?  ?End of Session Equipment Utilized During Treatment: Back brace ?Nurse Communication: Mobility status ? ?Activity Tolerance: Patient tolerated treatment well ?Patient left: in bed;with call bell/phone within reach ? ?OT Visit Diagnosis: Unsteadiness on feet (R26.81)  ?              ?Time: 5284-1324 ?OT Time Calculation (min): 21 min ?Charges:  OT General Charges ?$OT Visit: 1 Visit ?OT Evaluation ?$OT Eval Moderate Complexity: 1 Mod ? ?09/26/2021 ? ?RP, OTR/L ? ?Acute Rehabilitation Services ? ?Office:  [phone] ? ? ?Berline Semrad D Tanith Dagostino ?09/26/2021, 9:24 AM ?

## 2021-09-26 NOTE — Progress Notes (Signed)
PT Cancellation Note ? ?Patient Details ?Name: Jonathon Johnson ?MRN: [id number] ?DOB: 06-17-1945 ? ? ?Cancelled Treatment:    Reason Eval/Treat Not Completed: PT screened, no needs identified, will sign off Per OT, patient is modI for mobility and all education complete. No skilled PT needs identified acutely. PT will sign off.  ? ?Manroop Jakubowicz A. Dan Humphreys, PT, DPT ?Acute Rehabilitation Services ?Pager [phone] ?Office [phone] ? ? ? ?Shanekia Latella A Sandrea Boer ?09/26/2021, 9:25 AM ?

## 2021-09-26 NOTE — Care Management Obs Status (Signed)
MEDICARE OBSERVATION STATUS NOTIFICATION ? ? ?Patient Details  ?Name: Jonathon Johnson ?MRN: [id number] ?Date of Birth: July 30, 1945 ? ? ?Medicare Observation Status Notification Given:  Yes ? ?Notice given over phone due to remote. Patient  being DC, sending a copy to home ? ?Verdell Carmine, RN ?09/26/2021, 10:31 AM ?

## 2021-09-26 NOTE — Progress Notes (Signed)
? ? ?  Patient doing well  ?Reports reports resolution of leg pain ? ? ?Physical Exam: ?Vitals:  ? 09/26/21 0340 09/26/21 0734  ?BP: 113/77 104/71  ?Pulse: 94 81  ?Resp: 20 20  ?Temp: 98.2 ?F (36.8 ?C) 98 ?F (36.7 ?C)  ?SpO2: 97% 99%  ? ? ?Dressing in place ?NVI ? ?POD #1 s/p L4/5 decompression and fusion ? ?- up with PT/OT, encourage ambulation ?Nino Glow for pain, Robaxin for muscle spasms ?- d/c home today with f/u in 2 weeks  ?

## 2021-09-30 ENCOUNTER — Encounter (HOSPITAL_COMMUNITY): Payer: Self-pay | Admitting: Orthopedic Surgery

## 2021-10-02 NOTE — Discharge Summary (Signed)
? ? ?Patient ID: ?Jonathon Johnson ?MRN: [id number] ?DOB/AGE: [birth year] 76 y.o. ? ?Admit date: 09/25/2021 ?Discharge date: 09/26/2021 ? ?Admission Diagnoses:  ?Principal Problem: ?  Radiculopathy ? ? ?Discharge Diagnoses:  ?Same ? ?Past Medical History:  ?Diagnosis Date  ? Asthma   ? Chicken pox   ? GERD (gastroesophageal reflux disease)   ? Hyperlipidemia   ? Hypertension   ? ? ?Surgeries: Procedure(s): ?RIGHT-SIDED TRANSFORAMINAL LUMBAR INTERBODY FUSION AND DECOMPRESSION LUMBAR 4-  LUMBAR 5 WITH INSTRUMENTATION AND ALLOGRAFT on 09/25/2021 ?  ?Consultants: None ? ?Discharged Condition: Improved ? ?Hospital Course: Jonathon Johnson is an 76 y.o. male who was admitted 09/25/2021 for operative treatment of Radiculopathy. Patient has severe unremitting pain that affects sleep, daily activities, and work/hobbies. After pre-op clearance the patient was taken to the operating room on 09/25/2021 and underwent  Procedure(s): ?RIGHT-SIDED TRANSFORAMINAL LUMBAR INTERBODY FUSION AND DECOMPRESSION LUMBAR 4-  LUMBAR 5 WITH INSTRUMENTATION AND ALLOGRAFT.   ? ?Patient was given perioperative antibiotics:  ?Anti-infectives (From admission, onward)  ? ? Start     Dose/Rate Route Frequency Ordered Stop  ? 09/25/21 2000  vancomycin (VANCOCIN) IVPB 1000 mg/200 mL premix       ? 1,000 mg ?200 mL/hr over 60 Minutes Intravenous  Once 09/25/21 1427 09/25/21 1932  ? 09/25/21 0715  vancomycin (VANCOCIN) IVPB 1000 mg/200 mL premix       ? 1,000 mg ?200 mL/hr over 60 Minutes Intravenous On call to O.R. 09/25/21 1962 09/25/21 0853  ? ?  ?  ? ?Patient was given sequential compression devices, early ambulation to prevent DVT. ? ?Patient benefited maximally from hospital stay and there were no complications.   ? ?Recent vital signs: No data found.BP 104/71 (BP Location: Right Arm)   Pulse 81   Temp 98 ?F (36.7 ?C) (Oral)   Resp 20   Ht 5\' 7"  (1.702 m)   Wt 81.9 kg   SpO2 99%   BMI 28.29 kg/m?   ? ?Discharge Medications:   ?Allergies as of 09/26/2021    ? ?   Reactions  ? Amoxicillin Diarrhea  ? Morphine And Related Nausea And Vomiting  ? ?  ? ?  ?Medication List  ?  ? ?STOP taking these medications   ? ?ibuprofen 600 MG tablet ?Commonly known as: ADVIL ?  ? ?  ? ?TAKE these medications   ? ?amitriptyline 25 MG tablet ?Commonly known as: ELAVIL ?TAKE 1 TABLET AT BEDTIME ?  ?carvedilol 12.5 MG tablet ?Commonly known as: COREG ?TAKE 1 TABLET TWICE A DAY WITH A MEAL (KEEP OFFICE VISIT) ?  ?finasteride 5 MG tablet ?Commonly known as: PROSCAR ?TAKE 1 TABLET DAILY ?  ?fluticasone 50 MCG/ACT nasal spray ?Commonly known as: FLONASE ?USE 2 SPRAYS IN EACH NOSTRIL DAILY ?  ?methocarbamol 500 MG tablet ?Commonly known as: ROBAXIN ?Take 1 tablet (500 mg total) by mouth every 6 (six) hours as needed for muscle spasms. ?  ?multivitamin tablet ?Take 1 tablet by mouth daily. ?  ?omeprazole 40 MG capsule ?Commonly known as: PRILOSEC ?TAKE 1 CAPSULE DAILY ?  ?rosuvastatin 20 MG tablet ?Commonly known as: CRESTOR ?Take 1 tablet (20 mg total) by mouth daily. ?  ?SOOTHE OP ?Place 1 drop into both eyes 2 (two) times daily as needed (dry/itchy eyes). ?  ?tamsulosin 0.4 MG Caps capsule ?Commonly known as: FLOMAX ?TAKE 2 CAPSULES DAILY AFTER BREAKFAST ?  ?traMADol 50 MG tablet ?Commonly known as: ULTRAM ?Take 1-2 tablets (50-100 mg total) by mouth every 6 (six) hours  as needed for moderate pain or severe pain (Pt would like to use first line and avoid hydrocod if pos). ?  ?valsartan 160 MG tablet ?Commonly known as: Diovan ?Take 1 tablet (160 mg total) by mouth daily. ?What changed: when to take this ?  ?Wixela Inhub 250-50 MCG/ACT Aepb ?Generic drug: fluticasone-salmeterol ?USE 1 INHALATION TWICE A DAY ?What changed: See the new instructions. ?  ? ?  ? ? ?Diagnostic Studies: DG Lumbar Spine 2-3 Views ? ?Result Date: 09/25/2021 ?CLINICAL DATA:  L4-L5 TLIF EXAM: LUMBAR SPINE - 2-3 VIEW COMPARISON:  Lumbar spine radiograph 09/25/2021 FINDINGS: Intraoperative images during L4-L5 TLIF. Hardware  is intact without evidence of immediate complication. IMPRESSION: Intraoperative images during L4-L5 TLIF. Hardware is intact without evidence of immediate complication. Electronically Signed   By: Caprice Renshaw M.D.   On: 09/25/2021 12:18  ? ?DG Lumbar Spine 1 View ? ?Result Date: 09/25/2021 ?CLINICAL DATA:  Localization radiograph for L4-5 TLIF EXAM: LUMBAR SPINE - 1 VIEW COMPARISON:  None. FINDINGS: Single lateral view of the lumbar spine surgical planning. Markers at the L4-5 and L5-S1 disc spaces. Advanced multilevel degenerative disc disease of the lumbar spine. IMPRESSION: Single lateral view of the lumbar spine for localization. Electronically Signed   By: Larose Hires D.O.   On: 09/25/2021 12:36  ? ?DG C-Arm 1-60 Min-No Report ? ?Result Date: 09/25/2021 ?Fluoroscopy was utilized by the requesting physician.  No radiographic interpretation.  ? ?DG C-Arm 1-60 Min-No Report ? ?Result Date: 09/25/2021 ?Fluoroscopy was utilized by the requesting physician.  No radiographic interpretation.  ? ?DG C-Arm 1-60 Min-No Report ? ?Result Date: 09/25/2021 ?Fluoroscopy was utilized by the requesting physician.  No radiographic interpretation.   ? ?Disposition: Discharge disposition: 01-Home or Self Care ? ? ? ? ? ? ? ?POD #1 s/p L4/5 decompression and fusion ?  ?- up with PT/OT, encourage ambulation ?Nino Glow for pain, Robaxin for muscle spasms ?-Scripts for pain sent to pharmacy electronically  ?-D/C instructions sheet printed and in chart ?-D/C today  ?-F/U in office 2 weeks  ? ?Signed: ?Eilene Ghazi Euva Rundell ?10/02/2021, 12:42 PM ? ? ? ? ? ?  ?

## 2021-10-12 ENCOUNTER — Other Ambulatory Visit: Payer: Self-pay | Admitting: Family Medicine

## 2021-10-30 DIAGNOSIS — R3912 Poor urinary stream: Secondary | ICD-10-CM | POA: Diagnosis not present

## 2021-10-30 DIAGNOSIS — N401 Enlarged prostate with lower urinary tract symptoms: Secondary | ICD-10-CM | POA: Diagnosis not present

## 2021-11-05 DIAGNOSIS — M48062 Spinal stenosis, lumbar region with neurogenic claudication: Secondary | ICD-10-CM | POA: Diagnosis not present

## 2021-11-05 DIAGNOSIS — Z9889 Other specified postprocedural states: Secondary | ICD-10-CM | POA: Diagnosis not present

## 2021-11-12 ENCOUNTER — Telehealth: Payer: Self-pay | Admitting: Family Medicine

## 2021-11-12 DIAGNOSIS — I10 Essential (primary) hypertension: Secondary | ICD-10-CM

## 2021-11-12 MED ORDER — VALSARTAN 160 MG PO TABS: 160.0000 mg | ORAL_TABLET | Freq: Every day | ORAL | 2 refills | Status: DC

## 2021-11-18 DIAGNOSIS — Z006 Encounter for examination for normal comparison and control in clinical research program: Secondary | ICD-10-CM

## 2021-11-18 NOTE — Research (Addendum)
11/22/21-spoke with patient, scheduled for V1P screening visit on 12/03/21 at 0830. Patient aware he will need to be fasting for this visit. Given our address and code to park in parking garage.  Contacted patient regarding V1P study. Pt states they are interested in learning more about the study and verified e-mail address on file. Copy of ICF e-mailed to patient for review. Will follow up after reviewing ICF to schedule screening visit.

## 2021-12-03 ENCOUNTER — Other Ambulatory Visit: Payer: Self-pay

## 2021-12-03 ENCOUNTER — Encounter: Payer: Medicare Other | Admitting: *Deleted

## 2021-12-03 VITALS — BP 169/90 | HR 75 | Ht 65.35 in | Wt 179.0 lb

## 2021-12-03 DIAGNOSIS — Z006 Encounter for examination for normal comparison and control in clinical research program: Secondary | ICD-10-CM

## 2021-12-03 NOTE — Research (Signed)
V1P Informed Consent  Protocol No. [id number] ICF Version number: 00.00.00  Subject Name: Jonathon Johnson MRN: [id number]  DOB: [birth year]   Subject met inclusion and exclusion criteria.  The informed consent form, study requirements and expectations were reviewed with the subject and questions and concerns were addressed prior to the signing of the consent form.  The subject verbalized understanding of the trial requirements.  The subject agreed to participate in the Victor[employer] 1 Prevent trial and signed the informed consent on 03-Dec-2021 at 0822.  Informed consent was obtained prior to performance of any protocol-specific procedures. A copy of the signed informed consent was given to the subject and original copy was placed in the subject's medical record.    Wallace Cullens, RN

## 2021-12-03 NOTE — Research (Signed)
VP1 Screening Visit  Subject number: [id number]  Date: 03-Dec-2021  Informed consent signed '[x]'  Signed 03-Dec-2021 at [phone]  Inclusion and exclusion criteria reviewed '[x]'  Inclusion Criteria Informed Consent Signed  '[x]'   Male 47-76 years of age Male 21-69 years of age '[x]'  '[]'   At increased risk for first MACE by one of the following:   a.) Coronary artery calcium (CAC) score ?100 Agatstin units '[x]'   b.) Coronary artery stenosis ?20% but <50% in left main coronary artery or ? 20% but <70% in any major epicardial coronary artery '[]'   c.) High 10-year ASCVD risk ? 20% '[]'   d.) Intermediate 10-year ASCVD risk 7.5% - <20% with at least 2 of the following risk-enhancing factors '[]'   1. A first degree relative with history of premature ASCVD (males age <62;  females age <65) '[]'  '[]'   2. History of premature menopause before age 78 '[]'   3. History of preeclampsia, gestational hypertension, or gestational diabetes '[]'   4. [place] Asian ancestry '[]'   5. Chronic inflammatory disorders: -rheumatoid arthritis -systemic lupus erythematous -psoriasis covering ? 10% of body surface area -Chron's disease -ulcerative colitis -HIV/AIDS '[]'  '[]'  '[]'  '[]'  '[]'  '[]'   6. hsCRP ? 69m/L documented in the 12 months prior to screening visit without underlying inflammatory conditions '[]'   7. Lipoprotein (a) ? 50 mg/dL or 125 nmol/L '[]'   8. eGFR <655mmin/1.73 documented between screening and randomization visits '[]'   9. ABI <0.9 with no symptoms of intermittent claudication '[]'   10. Metabolic syndrome- Increased waist circumference Elevated triglycerides Elevated blood pressure Elevated glucose Low HDL-C (3 or more documented between screening and randomization visits)  '[]'  '[]'  '[]'  '[]'  '[]'   If on background LLT, dose stable for at least 4 weeks prior to screening  '[x]'   LDL-C ? 7046mL but <190 mg/dL at screening visit '[x]'    Exclusion Criteria History of Major ASCVD event defined as any: Acute coronary syndrome in past  12 months Prior myocardial infarction Prior ischemic stroke Symptomatic peripheral artery disease as evidenced by either intermittent claudication, previous revascularization, or amputation due to atherosclerotic disease at any time prior to randomization    '[]'     N/A '[x]'   History of, or planned ischemia-driven revascularization prior to randomization '[]'  N/A '[x]'   Absence of coronary atherosclerosis on CT angiogram/invasive coronary angiogram in the 2 years prior to randomization '[]'  N/A '[x]'   Coronary artery calcium score of 0 obtained in the 2 years prior to randomization '[]'  N/A '[x]'   Active liver disease of hepatic dysfunction, defined as AST or ALT ? 3x ULN from central laboratory test as screening visit '[]'  N/A '[x]'   Current or planned renal dialysis or transplantation '[]'  N/A '[x]'   Previous (within 90 days prior to screening), current or planned treatment with a mAb directed toward PCSK9 '[]'  N/A '[x]'   Previous exposure to inclisiran or other non-mAb PCSK9 within 2 years prior to randomization '[]'  N/A '[x]'   Severe concomitant non-cardiovascular disease that is expected to reduce life expectancy to less than 5 years '[]'  N/A '[x]'   Use of other investigational drugs within 30 days or until expected pharmacodynamic effected has returned to baseline, whichever is longer '[]'  N/A '[x]'   History of hypersensitivity to any of the study drugs or its excipients or to drugs of similar chemical class '[]'  N/A '[x]'   Any surgical or medical condition which may place the participant at higher risk from his/her participation in the study, or is likely to prevent the participant from complying with the requirements of the study of completing the  study  '[]'   N/A '[x]'   Unwillingness or inability to comply with study procedures (including adherence to study visits, fasting blood draws and compliance with study treatment regimens), and medication administration and schedule  '[]'   N/A '[x]'   Pregnant or nursing women '[]'  N/A '[x]'    Women of child-bearing potential, unless they are using effective methods of contraception while taking study treatment '[]'  N/A '[x]'      Patient demography reviewed '[x]'  Male '[x]'    Male '[]'  Age: 22 ETHNICITY Hispanic/Latino '[]'    Non-Hispanic/Latino '[x]'  RACE White '[x]'   Black or African American '[]'   Asian '[]'  American [place] or [place] Native '[]'   Native [place] or Other Pacific Islander '[]'  Other '[]'   Smoking history reviewed '[x]'   Never '[]'  Former '[x]'  Current Every Day '[]'  Current Some Days '[]'  Cigarettes '[x]'   Pipe '[]'   Cigars '[x]'   e-Cigarettes '[]'   Packs Per Day: 1 Years: 58 Quit date:June 2013  Past and current medical history reviewed '[x]'  Surgical history reviewed '[x]'  Prior or concomitant medications reviewed '[x]'  Lipid lowering medications reviewed '[x]'   Physical Exam performed by PI or SUB-I '[x]'    Height: 166cm    Weight: 179 lbs./ 81.4 kg     Waist circumference:98 cm   TIME: BP: PULSE:  0839 156/94 76  0842 160/88 74  0846 169/90 75  Mean BP: 162/91   Mean Pulse: 75  Labs collected at: 0927   Serum pregnancy '[]'  Fasting Lipid Profile '[x]'  Broad Clinical Chemistry '[x]'  Hematology panel '[x]'  Urinalysis '[x]'   Genetic Consent obtained '[x]'    Current Outpatient Medications:    amitriptyline (ELAVIL) 25 MG tablet, TAKE 1 TABLET AT BEDTIME, Disp: 90 tablet, Rfl: 3   carvedilol (COREG) 12.5 MG tablet, TAKE 1 TABLET TWICE A DAY WITH A MEAL (KEEP OFFICE VISIT), Disp: 180 tablet, Rfl: 3   finasteride (PROSCAR) 5 MG tablet, TAKE 1 TABLET DAILY, Disp: 90 tablet, Rfl: 3   fluticasone (FLONASE) 50 MCG/ACT nasal spray, USE 2 SPRAYS IN EACH NOSTRIL DAILY, Disp: 48 g, Rfl: 3   Multiple Vitamin (MULTIVITAMIN) tablet, Take 1 tablet by mouth daily., Disp: , Rfl:    omeprazole (PRILOSEC) 40 MG capsule, TAKE 1 CAPSULE DAILY, Disp: 90 capsule, Rfl: 3   Propylene Glycol-Glycerin (SOOTHE OP), Place 1 drop into both eyes 2 (two) times daily as needed (dry/itchy eyes)., Disp: , Rfl:     rosuvastatin (CRESTOR) 20 MG tablet, Take 1 tablet (20 mg total) by mouth daily., Disp: 90 tablet, Rfl: 3   tamsulosin (FLOMAX) 0.4 MG CAPS capsule, TAKE 2 CAPSULES DAILY AFTER BREAKFAST, Disp: 180 capsule, Rfl: 3   valsartan (DIOVAN) 160 MG tablet, Take 1 tablet (160 mg total) by mouth daily., Disp: 90 tablet, Rfl: 2   WIXELA INHUB 250-50 MCG/ACT AEPB, USE 1 INHALATION TWICE A DAY (Patient taking differently: Inhale 1 puff into the lungs daily.), Disp: 180 each, Rfl: 3   methocarbamol (ROBAXIN) 500 MG tablet, Take 1 tablet (500 mg total) by mouth every 6 (six) hours as needed for muscle spasms. (Patient not taking: Reported on 12/03/2021), Disp: 30 tablet, Rfl: 2   traMADol (ULTRAM) 50 MG tablet, Take 1-2 tablets (50-100 mg total) by mouth every 6 (six) hours as needed for moderate pain or severe pain (Pt would like to use first line and avoid hydrocod if pos). (Patient not taking: Reported on 12/03/2021), Disp: 30 tablet, Rfl: 2

## 2021-12-05 NOTE — Research (Signed)
Labs from screening, please review labs and document any clinically significant labs or non clinically significant labs.

## 2021-12-09 NOTE — Research (Addendum)
Patient scheduled for baseline visit 12/12/21 at 0800.   12/09/21- Called patient to let him know his labs are all WNL and we can bring him in for his baseline visit. Left message on VM for patient to return call.

## 2021-12-12 ENCOUNTER — Encounter: Payer: Medicare Other | Admitting: *Deleted

## 2021-12-12 ENCOUNTER — Other Ambulatory Visit: Payer: Self-pay

## 2021-12-12 VITALS — BP 145/86 | HR 75 | Wt 179.6 lb

## 2021-12-12 DIAGNOSIS — Z006 Encounter for examination for normal comparison and control in clinical research program: Secondary | ICD-10-CM

## 2021-12-12 MED ORDER — STUDY - VICTORION-1 PREVENT - INCLISIRAN 300 MG/1.5 ML OR PLACEBO SQ INJECTION (PI-STUCKEY)
300.0000 mg | INJECTION | SUBCUTANEOUS | Status: DC
  Administered 2021-12-12: 300 mg via SUBCUTANEOUS
  Filled 2021-12-12: qty 1.5

## 2021-12-12 NOTE — Research (Signed)
Victorion 1 Prevent Baseline/Randomization Visit Patient Name: Jonathon Johnson DOB: 06/09/1945 MRN: [id number]  Date: 12/12/21  Subject ID: [id number] Kit number: [id number]  Inclusion and exclusion criteria reviewed '[x]'   Prior or concomitant medications reviewed '[x]'   Lipid lowering medications reviewed '[x]'   Surgical history reviewed '[x]'    TIME: BP: PULSE:  0811 131/85 72  0813 170/80 74  0816 145/86 75   Mean BP: 149/80    Mean Pulse: 74 Weight: 179.6 bs./ 81.5 kg  Lifestyle instructions reviewed '[x]'   Labs collected at: 0856   Fasting Lipid Profile '[x]'  Fasting Lp(a) '[x]'  Liver function tests '[]'  hsCRP '[x]'  Genetic testing '[]'  Exploratory biomarkers '[]'   AE/SAE assessment completed '[x]'   Randomization completed '[x]'   Study drug administered '[x]'     Current Outpatient Medications:    Study - VICTORION-1 PREVENT - inclisiran 300 mg/1.77m or placebo SQ injection (PI-Stuckey), Inject 300 mg into the skin every 6 (six) months. For Investigational Use Only. Inject 1.5 mL subcutaneously into the abdomen, upper arm or thigh. Do not inject into areas of active skin disease, such as sunburns, skin rashes, inflammation, or skin infections. Administered on day 1, day 90, and every 6 months thereafter. Please contact [facility] for any questions or concerns regarding this medication., Disp: , Rfl:    amitriptyline (ELAVIL) 25 MG tablet, TAKE 1 TABLET AT BEDTIME, Disp: 90 tablet, Rfl: 3   carvedilol (COREG) 12.5 MG tablet, TAKE 1 TABLET TWICE A DAY WITH A MEAL (KEEP OFFICE VISIT), Disp: 180 tablet, Rfl: 3   finasteride (PROSCAR) 5 MG tablet, TAKE 1 TABLET DAILY, Disp: 90 tablet, Rfl: 3   fluticasone (FLONASE) 50 MCG/ACT nasal spray, USE 2 SPRAYS IN EACH NOSTRIL DAILY, Disp: 48 g, Rfl: 3   methocarbamol (ROBAXIN) 500 MG tablet, Take 1 tablet (500 mg total) by mouth every 6 (six) hours as needed for muscle spasms. (Patient not taking: Reported on 12/03/2021), Disp: 30  tablet, Rfl: 2   Multiple Vitamin (MULTIVITAMIN) tablet, Take 1 tablet by mouth daily., Disp: , Rfl:    omeprazole (PRILOSEC) 40 MG capsule, TAKE 1 CAPSULE DAILY, Disp: 90 capsule, Rfl: 3   Propylene Glycol-Glycerin (SOOTHE OP), Place 1 drop into both eyes 2 (two) times daily as needed (dry/itchy eyes)., Disp: , Rfl:    rosuvastatin (CRESTOR) 20 MG tablet, Take 1 tablet (20 mg total) by mouth daily., Disp: 90 tablet, Rfl: 3   tamsulosin (FLOMAX) 0.4 MG CAPS capsule, TAKE 2 CAPSULES DAILY AFTER BREAKFAST, Disp: 180 capsule, Rfl: 3   traMADol (ULTRAM) 50 MG tablet, Take 1-2 tablets (50-100 mg total) by mouth every 6 (six) hours as needed for moderate pain or severe pain (Pt would like to use first line and avoid hydrocod if pos). (Patient not taking: Reported on 12/03/2021), Disp: 30 tablet, Rfl: 2   valsartan (DIOVAN) 160 MG tablet, Take 1 tablet (160 mg total) by mouth daily., Disp: 90 tablet, Rfl: 2   WIXELA INHUB 250-50 MCG/ACT AEPB, USE 1 INHALATION TWICE A DAY (Patient taking differently: Inhale 1 puff into the lungs daily.), Disp: 180 each, Rfl: 3  Current Facility-Administered Medications:    Study - VICTORION-1 PREVENT - inclisiran 300 mg/1.573mor placebo SQ injection (PI-Stuckey), 300 mg, Subcutaneous, Q6 months, StHillary BowMD, 300 mg at 12/12/21 0900  Drug Administration   Date: 12/12/21 Subject ID #: [id number] #: [id number] #: [id number] Administration Site: right lower quadrant  Patient did well with first injection. Observed for 20 minutes following study drug  administration. Denies any signs/symptoms of injection reaction. Patient scheduled for Visit 2- day 90 on October 3rd at 0800.

## 2021-12-12 NOTE — Progress Notes (Signed)
Patient seen today for discussion of Victorian-Prevent.  Patient has not had prior CV event.  Elevated calcium score and risk score by the pooled equation.  Long discussion about trial and purpose of trial.  Inclisiran, its risks and benefits, and prior status reviewed.  All questions answered.    Exam BP 156/94. P76. Lungs clear. 1-2/6 SEM.  No Diastolic murmur. Abdomen soft. Extremities no edema.  Abdomen - benign.  Extremities without edema.   Screening labs to be obtained today and further status pending.  I will discuss with Dr. Bjorn Pippin when lab studies available.    Arturo Morton. Riley Kill, MD, [facility] Medical Director, [facility]

## 2021-12-16 DIAGNOSIS — M4326 Fusion of spine, lumbar region: Secondary | ICD-10-CM | POA: Diagnosis not present

## 2021-12-16 DIAGNOSIS — Z9889 Other specified postprocedural states: Secondary | ICD-10-CM | POA: Diagnosis not present

## 2021-12-16 DIAGNOSIS — Z006 Encounter for examination for normal comparison and control in clinical research program: Secondary | ICD-10-CM

## 2021-12-16 NOTE — Progress Notes (Signed)
Patient seen today as part of visit for [name].  I discussed his case with Dr.Schumann regarding options and discussed with the patient in detail  He qualifies for the trial and wishes to participate.    VSS as recorded.  Lungs clear.  Cardiac rhythm regular with minimal SEM.  No DM. No edema.   Options discussed in detail with patient, including randomization, treatment alternatives.  He wishes to participate.   Jonathon Johnson. Riley Kill, MD Medical Director, [facility]

## 2021-12-16 NOTE — Research (Signed)
Labs from baseline visit. Please review and document any clinically significant or non significant lab results.

## 2021-12-20 NOTE — Telephone Encounter (Signed)
error 

## 2021-12-24 NOTE — Research (Signed)
Labs from 12/12/2021 Baseline V1  Are there any labs that are clinically significant?  Yes []  OR No[]   Is the patient eligible to continue enrollment in the study after screening visit?  Yes []   OR No[] 

## 2021-12-30 NOTE — Therapy (Signed)
OUTPATIENT PHYSICAL THERAPY THORACOLUMBAR EVALUATION   Patient Name: Jonathon Johnson MRN: [id number] DOB:March 25, 1946, 76 y.o., male Today's Date: 12/31/2021   PT End of Session - 12/31/21 0752     Visit Number 1    Date for PT Re-Evaluation 04/02/22    Authorization Type Medicare    PT Start Time 0750    PT Stop Time 0839    PT Time Calculation (min) 49 min    Activity Tolerance Patient tolerated treatment well    Behavior During Therapy WFL for tasks assessed/performed             Past Medical History:  Diagnosis Date   Asthma    Chicken pox    GERD (gastroesophageal reflux disease)    Hyperlipidemia    Hypertension    Past Surgical History:  Procedure Laterality Date   CHOLECYSTECTOMY     TRANSFORAMINAL LUMBAR INTERBODY FUSION (TLIF) WITH PEDICLE SCREW FIXATION 1 LEVEL Right 09/25/2021   Procedure: RIGHT-SIDED TRANSFORAMINAL LUMBAR INTERBODY FUSION AND DECOMPRESSION LUMBAR 4-  LUMBAR 5 WITH INSTRUMENTATION AND ALLOGRAFT;  Surgeon: Phylliss Bob, MD;  Location: [facility];  Service: Orthopedics;  Laterality: Right;   Patient Active Problem List   Diagnosis Date Noted   Radiculopathy 09/25/2021   Post herpetic neuralgia 11/08/2020   Chronic bilateral low back pain with bilateral sciatica 08/28/2020   Alcohol use 03/16/2020   Thumb pain, right 06/28/2019   Arthralgia of right temporomandibular joint 12/20/2018   Diarrhea 12/20/2018   Healthcare maintenance 07/19/2018   Insomnia 07/19/2018   Gastroesophageal reflux disease 07/19/2018   Benign prostatic hyperplasia with urinary hesitancy 07/19/2018   Essential hypertension 07/19/2018   Elevated LDL cholesterol level 07/19/2018   Cough 07/19/2018   Mild reactive airways disease 07/19/2018    PCP: Abelino Derrick, MD  REFERRING PROVIDER: Lynann Bologna, MD  REFERRING DIAG: S/P lumbar fusion L4-5  Rationale for Evaluation and Treatment Rehabilitation  THERAPY DIAG:  Other low back pain  Muscle weakness  (generalized)  ONSET DATE: 09/25/21  SUBJECTIVE:                                                                                                                                                                                           SUBJECTIVE STATEMENT: Patient had a one level fusion April 19th, he reports that he had some pain for the past 2 years.  Tried epidurals prior to the surgery.  REports that he has had a good recovery so far, some right low back pain at times.  Had a large brace from surgery until July 14th.   PAIN:  Are you having pain? Yes: NPRS scale: 0/10 Pain  location: right low back Pain description: sore, tight, ache Aggravating factors: bending over, getting up from sitting, weeding Relieving factors: ibuprofen   PRECAUTIONS: None  WEIGHT BEARING RESTRICTIONS No  FALLS:  Has patient fallen in last 6 months? No  LIVING ENVIRONMENT: Lives with: lives with their family Lives in: House/apartment Stairs: No Has following equipment at home: None  OCCUPATION: retired  PLOF: Independent  worked out at a gym some cardio and some weights 3x/week  PATIENT GOALS be by to as normal as possible   OBJECTIVE:   SCREENING FOR RED FLAGS: Bowel or bladder incontinence: No Spinal tumors: No Cauda equina syndrome: No Compression fracture: No Abdominal aneurysm: No  COGNITION:  Overall cognitive status: Within functional limits for tasks assessed     SENSATION: WFL  MUSCLE LENGTH: Hamstrings: Right 50 deg; Left 50 deg Tight calves and piriformis  POSTURE: rounded shoulders and forward head  PALPATION: Tight in the lumbar area, has a mild diastisis recti  LUMBAR ROM:   Active  A/PROM  eval  Flexion Decreased 50%  Extension Decreased 25%  Right lateral flexion Decreased 50%  Left lateral flexion Decreased 50%  Right rotation   Left rotation    (Blank rows = not tested)  LOWER EXTREMITY ROM:   WFL's tight HS  LOWER EXTREMITY MMT:    MMT  Right eval Left eval  Hip flexion 4 4-  Hip extension    Hip abduction 4 4  Hip adduction    Hip internal rotation    Hip external rotation    Knee flexion 4- 4-  Knee extension 4 4  Ankle dorsiflexion 4 4  Ankle plantarflexion 4 4  Ankle inversion    Ankle eversion     (Blank rows = not tested)   TODAY'S TREATMENT  HEP   PATIENT EDUCATION:  Education details: Gentle stretches, HS and piriformis, discussion of body mechanics, and gym safety Person educated: Patient Education method: Explanation, Demonstration, Tactile cues, Verbal cues, and Handouts Education comprehension: verbalized understanding   ASSESSMENT:  CLINICAL IMPRESSION: Patient is a 76 y.o. male who was seen today for physical therapy evaluation and treatment for s/p lumbar fusion, he reports that he is at [employer] and doing good, and feels that he does not need PT, He has very tight HS, some core weakness and some left LE weakness, I went over body mechanics and gym safety and cautioned him on the gym and other activities.    OBJECTIVE IMPAIRMENTS difficulty walking, decreased ROM, decreased strength, impaired flexibility, and improper body mechanics.   REHAB POTENTIAL: Good  CLINICAL DECISION MAKING: Stable/uncomplicated  EVALUATION COMPLEXITY: Low   GOALS: Goals reviewed with patient? Yes  SHORT TERM GOALS: Target date: 01/13/22  HEP Goal status: met   PLAN: PT FREQUENCY: 2x/week  PT DURATION: 12 weeks  PLANNED INTERVENTIONS: Therapeutic exercises, Therapeutic activity, Neuromuscular re-education, Balance training, Gait training, Patient/Family education, Self Care, Joint mobilization, Dry Needling, Electrical stimulation, Wheelchair mobility training, Moist heat, Taping, and Manual therapy.  PLAN FOR NEXT SESSION: Patient feels like he is doing well, will take the HEP that we went over today and start, he is to call within the next 4-8 weeks if he has issues and we could reassess, other wise  D/C as he is doing well   Sumner Boast, PT 12/31/2021, 8:38 AM

## 2021-12-31 ENCOUNTER — Encounter: Payer: Self-pay | Admitting: Physical Therapy

## 2021-12-31 ENCOUNTER — Ambulatory Visit: Payer: Medicare Other | Attending: Orthopedic Surgery | Admitting: Physical Therapy

## 2021-12-31 DIAGNOSIS — M5459 Other low back pain: Secondary | ICD-10-CM | POA: Diagnosis not present

## 2021-12-31 DIAGNOSIS — M6281 Muscle weakness (generalized): Secondary | ICD-10-CM | POA: Diagnosis not present

## 2022-01-04 ENCOUNTER — Other Ambulatory Visit: Payer: Self-pay | Admitting: Family Medicine

## 2022-01-04 DIAGNOSIS — N401 Enlarged prostate with lower urinary tract symptoms: Secondary | ICD-10-CM

## 2022-01-30 ENCOUNTER — Ambulatory Visit: Payer: Medicare Other | Admitting: Family Medicine

## 2022-02-03 ENCOUNTER — Encounter: Payer: Self-pay | Admitting: Family Medicine

## 2022-02-03 ENCOUNTER — Ambulatory Visit (INDEPENDENT_AMBULATORY_CARE_PROVIDER_SITE_OTHER): Payer: Medicare Other | Admitting: Family Medicine

## 2022-02-03 VITALS — BP 170/85 | HR 72 | Temp 97.5°F | Ht 65.5 in | Wt 181.0 lb

## 2022-02-03 DIAGNOSIS — R3911 Hesitancy of micturition: Secondary | ICD-10-CM | POA: Diagnosis not present

## 2022-02-03 DIAGNOSIS — Z789 Other specified health status: Secondary | ICD-10-CM | POA: Diagnosis not present

## 2022-02-03 DIAGNOSIS — E7800 Pure hypercholesterolemia, unspecified: Secondary | ICD-10-CM | POA: Diagnosis not present

## 2022-02-03 DIAGNOSIS — I10 Essential (primary) hypertension: Secondary | ICD-10-CM | POA: Diagnosis not present

## 2022-02-03 DIAGNOSIS — B0229 Other postherpetic nervous system involvement: Secondary | ICD-10-CM

## 2022-02-03 DIAGNOSIS — N401 Enlarged prostate with lower urinary tract symptoms: Secondary | ICD-10-CM

## 2022-02-03 NOTE — Progress Notes (Signed)
Established Patient Office Visit  Subjective   Patient ID: Jonathon Johnson, male    DOB: 12/28/1945  Age: 76 y.o. MRN: [id number]  Chief Complaint  Patient presents with   Follow-up    6 mth cholesterol no concerns. Pt is not fasting.    HPI follow-up of hypertension, lower back pain, history of postherpetic neuralgia, and alcohol use.  Continues research lipid study with cardiology.  Blood pressure at home regularly runs in the 120s over 60s to 70s.  It does go up when he sees the doctor.  Continues 2 to 3 glasses of wine daily.  Exercises regularly at least 3 days weekly.  He no longer has pain from herpetic neuralgia.  He is status post both Shingrix vaccines.  Status post back surgery back in April.  Fortunately back pain is greatly improved and resolved.  He is having some balance issues and continues to work on that issue with exercises.  Has access to dental care.  Continues follow-up with urology.    Review of Systems  Constitutional: Negative.   HENT: Negative.    Eyes:  Negative for blurred vision, discharge and redness.  Respiratory: Negative.    Cardiovascular: Negative.   Gastrointestinal:  Negative for abdominal pain.  Genitourinary: Negative.   Musculoskeletal: Negative.  Negative for myalgias.  Skin:  Negative for rash.  Neurological:  Negative for tingling, loss of consciousness and weakness.  Endo/Heme/Allergies:  Negative for polydipsia.      Objective:     BP (!) 170/85 (BP Location: Left Arm, Patient Position: Sitting, Cuff Size: Normal)   Pulse 72   Temp (!) 97.5 F (36.4 C) (Temporal)   Ht 5' 5.5" (1.664 m)   Wt 181 lb (82.1 kg)   SpO2 97%   BMI 29.66 kg/m    Physical Exam Constitutional:      General: He is not in acute distress.    Appearance: Normal appearance. He is not ill-appearing, toxic-appearing or diaphoretic.  HENT:     Head: Normocephalic and atraumatic.     Right Ear: External ear normal.     Left Ear: External ear normal.      Mouth/Throat:     Mouth: Mucous membranes are moist.     Pharynx: Oropharynx is clear. No oropharyngeal exudate or posterior oropharyngeal erythema.  Eyes:     General: No scleral icterus.       Right eye: No discharge.        Left eye: No discharge.     Extraocular Movements: Extraocular movements intact.     Conjunctiva/sclera: Conjunctivae normal.     Pupils: Pupils are equal, round, and reactive to light.  Neck:     Vascular: No carotid bruit.  Cardiovascular:     Rate and Rhythm: Normal rate and regular rhythm.  Pulmonary:     Effort: Pulmonary effort is normal. No respiratory distress.     Breath sounds: Normal breath sounds.  Abdominal:     General: Bowel sounds are normal.  Musculoskeletal:     Cervical back: No rigidity or tenderness.  Lymphadenopathy:     Cervical: No cervical adenopathy.  Skin:    General: Skin is warm and dry.  Neurological:     Mental Status: He is alert and oriented to person, place, and time.  Psychiatric:        Mood and Affect: Mood normal.        Behavior: Behavior normal.      No results found for any visits  on 02/03/22.    The 10-year ASCVD risk score (Arnett DK, et al., 2019) is: 37.2%    Assessment & Plan:   Problem List Items Addressed This Visit       Nervous and Auditory   Post herpetic neuralgia     Genitourinary   Benign prostatic hyperplasia with urinary hesitancy     Other   Elevated LDL cholesterol level - Primary   Alcohol use   Other Visit Diagnoses     White coat syndrome with diagnosis of hypertension           Return in about 6 months (around 08/06/2022), or if symptoms worsen or fail to improve.  Continue all medicines as above and follow-up with cardiology for cholesterol study.  Continue follow-up with urology.  Discussed alcohol's effect on the blood pressure.  Mliss Sax, MD

## 2022-02-21 ENCOUNTER — Other Ambulatory Visit (HOSPITAL_BASED_OUTPATIENT_CLINIC_OR_DEPARTMENT_OTHER): Payer: Self-pay

## 2022-02-21 DIAGNOSIS — Z23 Encounter for immunization: Secondary | ICD-10-CM | POA: Diagnosis not present

## 2022-02-21 MED ORDER — INFLUENZA VAC A&B SA ADJ QUAD 0.5 ML IM PRSY
PREFILLED_SYRINGE | INTRAMUSCULAR | 0 refills | Status: DC
  Filled 2022-02-21: qty 0.5, 1d supply, fill #0

## 2022-03-11 DIAGNOSIS — Z006 Encounter for examination for normal comparison and control in clinical research program: Secondary | ICD-10-CM

## 2022-03-11 MED ORDER — STUDY - VICTORION-1 PREVENT - INCLISIRAN 300 MG/1.5 ML OR PLACEBO SQ INJECTION (PI-STUCKEY)
300.0000 mg | INJECTION | SUBCUTANEOUS | Status: DC
  Administered 2022-03-11: 300 mg via SUBCUTANEOUS
  Filled 2022-03-11: qty 1.5

## 2022-03-11 NOTE — Research (Signed)
V1P visit 2  Subject ID:  Kit Number: [id number]

## 2022-03-11 NOTE — Research (Signed)
V2 V1P   Patient feeling great.  No complaints of chest pains.  Meds reviewed.  Lifestyle instructions reviewed with patient. Labs drawn today and sent out   Injection given today: yes Location of administration: abdominal region  Vitals:   03/11/22 0804  BP: (!) 168/86  Pulse: 73  Resp: 14  Height: 5\' 7"  (1.702 m)  Weight: 177 lb (80.3 kg)  SpO2: 99%  BMI (Calculated): 27.72     Current Outpatient Medications:    amitriptyline (ELAVIL) 25 MG tablet, TAKE 1 TABLET AT BEDTIME, Disp: 90 tablet, Rfl: 3   carvedilol (COREG) 12.5 MG tablet, TAKE 1 TABLET TWICE A DAY WITH A MEAL (KEEP OFFICE VISIT), Disp: 180 tablet, Rfl: 3   finasteride (PROSCAR) 5 MG tablet, TAKE 1 TABLET DAILY, Disp: 90 tablet, Rfl: 3   fluticasone (FLONASE) 50 MCG/ACT nasal spray, USE 2 SPRAYS IN EACH NOSTRIL DAILY, Disp: 48 g, Rfl: 3   influenza vaccine adjuvanted (FLUAD) 0.5 ML injection, Inject into the muscle., Disp: 0.5 mL, Rfl: 0   Multiple Vitamin (MULTIVITAMIN) tablet, Take 1 tablet by mouth daily., Disp: , Rfl:    omeprazole (PRILOSEC) 40 MG capsule, TAKE 1 CAPSULE DAILY, Disp: 90 capsule, Rfl: 3   Propylene Glycol-Glycerin (SOOTHE OP), Place 1 drop into both eyes 2 (two) times daily as needed (dry/itchy eyes)., Disp: , Rfl:    rosuvastatin (CRESTOR) 20 MG tablet, Take 1 tablet (20 mg total) by mouth daily., Disp: 90 tablet, Rfl: 3   Study - VICTORION-1 PREVENT - inclisiran 300 mg/1.6mL or placebo SQ injection (PI-Stuckey), Inject 300 mg into the skin every 6 (six) months. For Investigational Use Only. Inject 1.5 mL subcutaneously into the abdomen, upper arm or thigh. Do not inject into areas of active skin disease, such as sunburns, skin rashes, inflammation, or skin infections. Administered on day 1, day 90, and every 6 months thereafter. Please contact [facility] for any questions or concerns regarding this medication., Disp: , Rfl:    tamsulosin (FLOMAX) 0.4 MG CAPS  capsule, TAKE 2 CAPSULES DAILY AFTER BREAKFAST, Disp: 180 capsule, Rfl: 3   valsartan (DIOVAN) 160 MG tablet, Take 1 tablet (160 mg total) by mouth daily., Disp: 90 tablet, Rfl: 2   WIXELA INHUB 250-50 MCG/ACT AEPB, USE 1 INHALATION TWICE A DAY (Patient taking differently: Inhale 1 puff into the lungs daily.), Disp: 180 each, Rfl: 3  Current Facility-Administered Medications:    Study - VICTORION-1 PREVENT - inclisiran 300 mg/1.50mL or placebo SQ injection (PI-Stuckey), 300 mg, Subcutaneous, Q6 months, Hillary Bow, MD, 300 mg at 03/11/22 [phone]

## 2022-03-13 NOTE — Research (Signed)
Please review labs for any clinically significant abnormal labs:

## 2022-03-25 DIAGNOSIS — M5451 Vertebrogenic low back pain: Secondary | ICD-10-CM | POA: Diagnosis not present

## 2022-04-23 ENCOUNTER — Other Ambulatory Visit: Payer: Self-pay | Admitting: Cardiology

## 2022-04-23 NOTE — Telephone Encounter (Signed)
Patient of Dr. Schumann. Please review for refill. Thank you!  

## 2022-04-28 DIAGNOSIS — N401 Enlarged prostate with lower urinary tract symptoms: Secondary | ICD-10-CM | POA: Diagnosis not present

## 2022-04-28 DIAGNOSIS — Z125 Encounter for screening for malignant neoplasm of prostate: Secondary | ICD-10-CM | POA: Diagnosis not present

## 2022-04-28 DIAGNOSIS — R3912 Poor urinary stream: Secondary | ICD-10-CM | POA: Diagnosis not present

## 2022-04-29 ENCOUNTER — Telehealth: Payer: Self-pay | Admitting: *Deleted

## 2022-04-29 NOTE — Telephone Encounter (Signed)
Left message to call back and emailed patient the new consent

## 2022-05-08 ENCOUNTER — Encounter: Payer: Self-pay | Admitting: *Deleted

## 2022-05-08 DIAGNOSIS — Z006 Encounter for examination for normal comparison and control in clinical research program: Secondary | ICD-10-CM

## 2022-05-09 ENCOUNTER — Ambulatory Visit
Admission: EM | Admit: 2022-05-09 | Discharge: 2022-05-09 | Disposition: A | Discharge status: Home / Self Care | Payer: Medicare Other

## 2022-05-09 DIAGNOSIS — L02512 Cutaneous abscess of left hand: Secondary | ICD-10-CM

## 2022-05-09 MED ORDER — SULFAMETHOXAZOLE-TRIMETHOPRIM 800-160 MG PO TABS: 1.0000 | ORAL_TABLET | Freq: Two times a day (BID) | ORAL | 0 refills | Status: AC

## 2022-05-09 NOTE — ED Provider Notes (Signed)
[facility]    CSN: [id number] Arrival date & time: 05/09/22  0956    HISTORY   Chief Complaint  Patient presents with   Foreign Body    Left thumb    HPI Jonathon Johnson is a pleasant, 76 y.o. male who presents to urgent care today. Patient complains of getting a splinter in his left thumb 2 weeks ago.  Patient states he believes that the splinter is still present.  Patient states he has been attempting to pierce it with a needle and drain it, first episode 2 days ago.  Patient states there has been a lot of pus draining from the wound since and it is now painful to touch.  Patient denies red streaking or swelling of the thumb.  Patient states his Tdap booster is current.  The history is provided by the patient.   Past Medical History:  Diagnosis Date   Asthma    Chicken pox    GERD (gastroesophageal reflux disease)    Hyperlipidemia    Hypertension    Patient Active Problem List   Diagnosis Date Noted   Radiculopathy 09/25/2021   Post herpetic neuralgia 11/08/2020   Chronic bilateral low back pain with bilateral sciatica 08/28/2020   Alcohol use 03/16/2020   Thumb pain, right 06/28/2019   Arthralgia of right temporomandibular joint 12/20/2018   Diarrhea 12/20/2018   Healthcare maintenance 07/19/2018   Insomnia 07/19/2018   Gastroesophageal reflux disease 07/19/2018   Benign prostatic hyperplasia with urinary hesitancy 07/19/2018   Essential hypertension 07/19/2018   Elevated LDL cholesterol level 07/19/2018   Cough 07/19/2018   Mild reactive airways disease 07/19/2018   Past Surgical History:  Procedure Laterality Date   CHOLECYSTECTOMY     TRANSFORAMINAL LUMBAR INTERBODY FUSION (TLIF) WITH PEDICLE SCREW FIXATION 1 LEVEL Right 09/25/2021   Procedure: RIGHT-SIDED TRANSFORAMINAL LUMBAR INTERBODY FUSION AND DECOMPRESSION LUMBAR 4-  LUMBAR 5 WITH INSTRUMENTATION AND ALLOGRAFT;  Surgeon: Estill Bamberg, MD;  Location: [facility] OR;  Service: Orthopedics;  Laterality:  Right;    Home Medications    Prior to Admission medications   Medication Sig Start Date End Date Taking? Authorizing Provider  ibuprofen (ADVIL) 600 MG tablet Take 600 mg by mouth every 6 (six) hours as needed.   Yes [provider]  methocarbamol (ROBAXIN) 500 MG tablet Take by mouth. 03/25/22  Yes [provider]  amitriptyline (ELAVIL) 25 MG tablet TAKE 1 TABLET AT BEDTIME 10/14/21   Mliss Sax, MD  carvedilol (COREG) 12.5 MG tablet TAKE 1 TABLET TWICE A DAY WITH A MEAL (KEEP OFFICE VISIT) 05/23/21   Little Ishikawa, MD  finasteride (PROSCAR) 5 MG tablet TAKE 1 TABLET DAILY 01/05/22   Mliss Sax, MD  fluticasone [facility]) 50 MCG/ACT nasal spray USE 2 SPRAYS IN [facility] NOSTRIL DAILY 07/12/20   Worthy Rancher B, FNP  influenza vaccine adjuvanted (FLUAD) 0.5 ML injection Inject into the muscle. 02/21/22     Multiple Vitamin (MULTIVITAMIN) tablet Take 1 tablet by mouth daily.    [provider]  omeprazole (PRILOSEC) 40 MG capsule TAKE 1 CAPSULE DAILY 05/27/21   Mliss Sax, MD  Propylene Glycol-Glycerin (SOOTHE OP) Place 1 drop into both eyes 2 (two) times daily as needed (dry/itchy eyes).    [provider]  rosuvastatin (CRESTOR) 20 MG tablet Take 1 tablet (20 mg total) by mouth daily. Please schedule appointment for additional refills. 1st Attempt. 04/24/22   Little Ishikawa, MD  Study - VICTORION-1 PREVENT - inclisiran 300  mg/1.945mL or placebo SQ injection (PI-Stuckey) Inject 300 mg into the skin every 6 (six) months. For Investigational Use Only. Inject 1.5 mL subcutaneously into the abdomen, upper arm or thigh. Do not inject into areas of active skin disease, such as sunburns, skin rashes, inflammation, or skin infections. Administered on day 1, day 90, and every 6 months thereafter. Please contact [facility] for any questions or concerns regarding this medication. 12/12/21   Herby AbrahamStuckey,  Thomas D, MD  tamsulosin (FLOMAX) 0.4 MG CAPS capsule TAKE 2 CAPSULES DAILY AFTER BREAKFAST 07/12/20   Worthy RancherWebb, Padonda B, FNP  valsartan (DIOVAN) 160 MG tablet Take 1 tablet (160 mg total) by mouth daily. 11/12/21   Cleaver, Thomasene RippleJesse M, NP  WIXELA INHUB 250-50 MCG/ACT AEPB USE 1 INHALATION TWICE A DAY Patient taking differently: Inhale 1 puff into the lungs daily. 08/07/21   Mliss SaxKremer, William Alfred, MD    Family History Family History  Problem Relation Age of Onset   Alcohol abuse Mother    Depression Mother    Diabetes Mother    Alcohol abuse Father    COPD Father    Cancer Sister    Hypertension Brother    Migraines Daughter    Multiple sclerosis Daughter    Social History Social History   Tobacco Use   Smoking status: Former    Types: Cigarettes    Quit date: 07/19/2010    Years since quitting: 11.8   Smokeless tobacco: Never  Vaping Use   Vaping Use: Never used  Substance Use Topics   Alcohol use: Yes    Alcohol/week: 14.0 standard drinks of alcohol    Types: 14 Glasses of wine per week    Comment: 3-4 glasses of wine daily   Drug use: Never   Allergies   Amoxicillin and Morphine and related  Review of Systems Review of Systems Pertinent findings revealed after performing a 14 point review of systems has been noted in the history of present illness.  Physical Exam Triage Vital Signs ED Triage Vitals  Enc Vitals Group     BP 04/05/21 0827 (!) 147/82     Pulse Rate 04/05/21 0827 72     Resp 04/05/21 0827 18     Temp 04/05/21 0827 98.3 F (36.8 C)     Temp Source 04/05/21 0827 Oral     SpO2 04/05/21 0827 98 %     Weight --      Height --      Head Circumference --      Peak Flow --      Pain Score 04/05/21 0826 5     Pain Loc --      Pain Edu? --      Excl. in GC? --   No data found.  Updated Vital Signs BP (!) 164/98 (BP Location: Left Arm)   Pulse 73   Temp 98.2 F (36.8 C) (Oral)   Resp 16   SpO2 97%   Physical Exam Vitals and nursing note reviewed.   Constitutional:      General: He is not in acute distress.    Appearance: Normal appearance. He is normal weight. He is not ill-appearing.  HENT:     Head: Normocephalic and atraumatic.  Eyes:     Extraocular Movements: Extraocular movements intact.     Conjunctiva/sclera: Conjunctivae normal.     Pupils: Pupils are equal, round, and reactive to light.  Cardiovascular:     Rate and Rhythm: Normal rate and regular rhythm.  Pulmonary:     Effort: Pulmonary effort is normal.     Breath sounds: Normal breath sounds.  Musculoskeletal:        General: Normal range of motion.       Hands:     Cervical back: Normal range of motion and neck supple.  Skin:    General: Skin is warm and dry.  Neurological:     General: No focal deficit present.     Mental Status: He is alert and oriented to person, place, and time. Mental status is at baseline.  Psychiatric:        Mood and Affect: Mood normal.        Behavior: Behavior normal.        Thought Content: Thought content normal.        Judgment: Judgment normal.     Visual Acuity Right Eye Distance:   Left Eye Distance:   Bilateral Distance:    Right Eye Near:   Left Eye Near:    Bilateral Near:     UC Couse / Diagnostics / Procedures:     Radiology No results found.  Procedures Procedures (including critical care time) EKG  Pending results:  Labs Reviewed - No data to display  Medications Ordered in UC: Medications - No data to display  UC Diagnoses / Final Clinical Impressions(s)   I have reviewed the triage vital signs and the nursing notes.  Pertinent labs & imaging results that were available during my care of the patient were reviewed by me and considered in my medical decision making (see chart for details).    Final diagnoses:  Abscess of left thumb   ***  ED Prescriptions     Medication Sig Dispense Auth. Provider   sulfamethoxazole-trimethoprim (BACTRIM DS) 800-160 MG tablet Take 1 tablet by mouth 2  (two) times daily for 5 days. 10 tablet Theadora Rama Scales, PA-C      PDMP not reviewed this encounter.  Pending results:  Labs Reviewed - No data to display  Discharge Instructions:   Discharge Instructions      Please continue to soak your left thumb in warm water with Epsom salt for 20 minutes 2-3 times daily.  You can certainly encourage the wound to drain with gentle pressure but do not attempt to pierce it to facilitate this.  I sent a prescription for Bactrim to your pharmacy that you should take twice daily for the next 5 days.  Please follow-up either with your primary care physician return to urgent care if you do not have complete resolution of this issue after completing antibiotics.  Thank you for visiting urgent care today.      Disposition Upon Discharge:  Condition: stable for discharge home  Patient presented with an acute illness with associated systemic symptoms and significant discomfort requiring urgent management. In my opinion, this is a condition that a prudent lay person (someone who possesses an average knowledge of health and medicine) may potentially expect to result in complications if not addressed urgently such as respiratory distress, impairment of bodily function or dysfunction of bodily organs.   Routine symptom specific, illness specific and/or disease specific instructions were discussed with the patient and/or caregiver at length.   As such, the patient has been evaluated and assessed, work-up was performed and treatment was provided in alignment with urgent care protocols and evidence based medicine.  Patient/parent/caregiver has been advised that the patient may require follow up for further testing and treatment if the symptoms  continue in spite of treatment, as clinically indicated and appropriate.  Patient/parent/caregiver has been advised to return to the [facility] or PCP if no better; to PCP or the Emergency Department if new signs and  symptoms develop, or if the current signs or symptoms continue to change or worsen for further workup, evaluation and treatment as clinically indicated and appropriate  The patient will follow up with their current PCP if and as advised. If the patient does not currently have a PCP we will assist them in obtaining one.   The patient may need specialty follow up if the symptoms continue, in spite of conservative treatment and management, for further workup, evaluation, consultation and treatment as clinically indicated and appropriate.   Patient/parent/caregiver verbalized understanding and agreement of plan as discussed.  All questions were addressed during visit.  Please see discharge instructions below for further details of plan.  This office note has been dictated using Teaching laboratory technician.  Unfortunately, this method of dictation can sometimes lead to typographical or grammatical errors.  I apologize for your inconvenience in advance if this occurs.  Please do not hesitate to reach out to me if clarification is needed.

## 2022-05-09 NOTE — ED Triage Notes (Addendum)
Pt states he got a splinter in his left thumb x 2 weeks. States piece still in thumb. Has noted pus x 2 days ago. Treating with triple antibiotic ointment. Attempted to remove splinter with pin but no success. Last Tdap 06/28/2019.

## 2022-05-09 NOTE — Discharge Instructions (Signed)
Please continue to soak your left thumb in warm water with Epsom salt for 20 minutes 2-3 times daily.  You can certainly encourage the wound to drain with gentle pressure but do not attempt to pierce it to facilitate this.  I sent a prescription for Bactrim to your pharmacy that you should take twice daily for the next 5 days.  Please follow-up either with your primary care physician return to urgent care if you do not have complete resolution of this issue after completing antibiotics.  Thank you for visiting urgent care today.

## 2022-05-21 ENCOUNTER — Other Ambulatory Visit: Payer: Self-pay | Admitting: Family Medicine

## 2022-05-21 ENCOUNTER — Other Ambulatory Visit: Payer: Self-pay | Admitting: Cardiology

## 2022-05-21 DIAGNOSIS — R059 Cough, unspecified: Secondary | ICD-10-CM

## 2022-05-21 DIAGNOSIS — K219 Gastro-esophageal reflux disease without esophagitis: Secondary | ICD-10-CM

## 2022-06-18 ENCOUNTER — Other Ambulatory Visit: Payer: Self-pay | Admitting: General Practice

## 2022-07-28 ENCOUNTER — Ambulatory Visit (INDEPENDENT_AMBULATORY_CARE_PROVIDER_SITE_OTHER): Payer: Medicare Other

## 2022-07-28 VITALS — Ht 67.0 in | Wt 174.0 lb

## 2022-07-28 DIAGNOSIS — Z0000 Encounter for general adult medical examination without abnormal findings: Secondary | ICD-10-CM | POA: Diagnosis not present

## 2022-07-28 NOTE — Patient Instructions (Signed)
[name] , Thank you for taking time to come for your Medicare Wellness Visit. I appreciate your ongoing commitment to your health goals. Please review the following plan we discussed and let me know if I can assist you in the future.   These are the goals we discussed:  Goals      DIET - EAT MORE FRUITS AND VEGETABLES     DIET - INCREASE WATER INTAKE     Increase physical activity     Patient Stated     Eat smaller portions     Patient Stated     07/28/2022, wants to remain nimble        This is a list of the screening recommended for you and due dates:  Health Maintenance  Topic Date Due   Eye exam for diabetics  01/14/2022   COVID-19 Vaccine (7 - 2023-24 season) 02/07/2022   Medicare Annual Wellness Visit  07/29/2023   DTaP/Tdap/Td vaccine (3 - Td or Tdap) 06/27/2029   Pneumonia Vaccine  Completed   Flu Shot  Completed   Hepatitis C Screening: USPSTF Recommendation to screen - Ages 18-79 yo.  Completed   Zoster (Shingles) Vaccine  Completed   HPV Vaccine  Aged Out   Colon Cancer Screening  Discontinued    Advanced directives: Please bring a copy of your POA (Power of Attorney) and/or Living Will to your next appointment.   Conditions/risks identified: none  Next appointment: Follow up in one year for your annual wellness visit.   Preventive Care 29 Years and Older, Male  Preventive care refers to lifestyle choices and visits with your health care provider that can promote health and wellness. What does preventive care include? A yearly physical exam. This is also called an annual well check. Dental exams once or twice a year. Routine eye exams. Ask your health care provider how often you should have your eyes checked. Personal lifestyle choices, including: Daily care of your teeth and gums. Regular physical activity. Eating a healthy diet. Avoiding tobacco and drug use. Limiting alcohol use. Practicing safe sex. Taking low doses of aspirin every day. Taking  vitamin and mineral supplements as recommended by your health care provider. What happens during an annual well check? The services and screenings done by your health care provider during your annual well check will depend on your age, overall health, lifestyle risk factors, and family history of disease. Counseling  Your health care provider may ask you questions about your: Alcohol use. Tobacco use. Drug use. Emotional well-being. Home and relationship well-being. Sexual activity. Eating habits. History of falls. Memory and ability to understand (cognition). Work and work Statistician. Screening  You may have the following tests or measurements: Height, weight, and BMI. Blood pressure. Lipid and cholesterol levels. These may be checked every 5 years, or more frequently if you are over 68 years old. Skin check. Lung cancer screening. You may have this screening every year starting at age 21 if you have a 30-pack-year history of smoking and currently smoke or have quit within the past 15 years. Fecal occult blood test (FOBT) of the stool. You may have this test every year starting at age 34. Flexible sigmoidoscopy or colonoscopy. You may have a sigmoidoscopy every 5 years or a colonoscopy every 10 years starting at age 39. Prostate cancer screening. Recommendations will vary depending on your family history and other risks. Hepatitis C blood test. Hepatitis B blood test. Sexually transmitted disease (STD) testing. Diabetes screening. This is done  by checking your blood sugar (glucose) after you have not eaten for a while (fasting). You may have this done every 1-3 years. Abdominal aortic aneurysm (AAA) screening. You may need this if you are a current or former smoker. Osteoporosis. You may be screened starting at age 22 if you are at high risk. Talk with your health care provider about your test results, treatment options, and if necessary, the need for more tests. Vaccines  Your  health care provider may recommend certain vaccines, such as: Influenza vaccine. This is recommended every year. Tetanus, diphtheria, and acellular pertussis (Tdap, Td) vaccine. You may need a Td booster every 10 years. Zoster vaccine. You may need this after age 70. Pneumococcal 13-valent conjugate (PCV13) vaccine. One dose is recommended after age 19. Pneumococcal polysaccharide (PPSV23) vaccine. One dose is recommended after age 66. Talk to your health care provider about which screenings and vaccines you need and how often you need them. This information is not intended to replace advice given to you by your health care provider. Make sure you discuss any questions you have with your health care provider. Document Released: 06/22/2015 Document Revised: 02/13/2016 Document Reviewed: 03/27/2015 Elsevier Interactive Patient Education  2017 [place] Prevention in the Home Falls can cause injuries. They can happen to people of all ages. There are many things you can do to make your home safe and to help prevent falls. What can I do on the outside of my home? Regularly fix the edges of walkways and driveways and fix any cracks. Remove anything that might make you trip as you walk through a door, such as a raised step or threshold. Trim any bushes or trees on the path to your home. Use bright outdoor lighting. Clear any walking paths of anything that might make someone trip, such as rocks or tools. Regularly check to see if handrails are loose or broken. Make sure that both sides of any steps have handrails. Any raised decks and porches should have guardrails on the edges. Have any leaves, snow, or ice cleared regularly. Use sand or salt on walking paths during winter. Clean up any spills in your garage right away. This includes oil or grease spills. What can I do in the bathroom? Use night lights. Install grab bars by the toilet and in the tub and shower. Do not use towel bars as  grab bars. Use non-skid mats or decals in the tub or shower. If you need to sit down in the shower, use a plastic, non-slip stool. Keep the floor dry. Clean up any water that spills on the floor as soon as it happens. Remove soap buildup in the tub or shower regularly. Attach bath mats securely with double-sided non-slip rug tape. Do not have throw rugs and other things on the floor that can make you trip. What can I do in the bedroom? Use night lights. Make sure that you have a light by your bed that is easy to reach. Do not use any sheets or blankets that are too big for your bed. They should not hang down onto the floor. Have a firm chair that has side arms. You can use this for support while you get dressed. Do not have throw rugs and other things on the floor that can make you trip. What can I do in the kitchen? Clean up any spills right away. Avoid walking on wet floors. Keep items that you use a lot in easy-to-reach places. If you need to  reach something above you, use a strong step stool that has a grab bar. Keep electrical cords out of the way. Do not use floor polish or wax that makes floors slippery. If you must use wax, use non-skid floor wax. Do not have throw rugs and other things on the floor that can make you trip. What can I do with my stairs? Do not leave any items on the stairs. Make sure that there are handrails on both sides of the stairs and use them. Fix handrails that are broken or loose. Make sure that handrails are as long as the stairways. Check any carpeting to make sure that it is firmly attached to the stairs. Fix any carpet that is loose or worn. Avoid having throw rugs at the top or bottom of the stairs. If you do have throw rugs, attach them to the floor with carpet tape. Make sure that you have a light switch at the top of the stairs and the bottom of the stairs. If you do not have them, ask someone to add them for you. What else can I do to help prevent  falls? Wear shoes that: Do not have high heels. Have rubber bottoms. Are comfortable and fit you well. Are closed at the toe. Do not wear sandals. If you use a stepladder: Make sure that it is fully opened. Do not climb a closed stepladder. Make sure that both sides of the stepladder are locked into place. Ask someone to hold it for you, if possible. Clearly mark and make sure that you can see: Any grab bars or handrails. First and last steps. Where the edge of each step is. Use tools that help you move around (mobility aids) if they are needed. These include: Canes. Walkers. Scooters. Crutches. Turn on the lights when you go into a dark area. Replace any light bulbs as soon as they burn out. Set up your furniture so you have a clear path. Avoid moving your furniture around. If any of your floors are uneven, fix them. If there are any pets around you, be aware of where they are. Review your medicines with your doctor. Some medicines can make you feel dizzy. This can increase your chance of falling. Ask your doctor what other things that you can do to help prevent falls. This information is not intended to replace advice given to you by your health care provider. Make sure you discuss any questions you have with your health care provider. Document Released: 03/22/2009 Document Revised: 11/01/2015 Document Reviewed: 06/30/2014 Elsevier Interactive Patient Education  2017 [employer].

## 2022-07-28 NOTE — Progress Notes (Signed)
I connected with  Nakul Sheckler on 07/28/22 by a audio enabled telemedicine application and verified that I am speaking with the correct person using two identifiers.  Patient Location: Home  Provider Location: Office/Clinic  I discussed the limitations of evaluation and management by telemedicine. The patient expressed understanding and agreed to proceed.  Subjective:   Jonathon Johnson is a 77 y.o. male who presents for Medicare Annual/Subsequent preventive examination.  Review of Systems     Cardiac Risk Factors include: advanced age (>53mn, >>65women);hypertension;male gender     Objective:    Today's Vitals   07/28/22 1511  Weight: 174 lb (78.9 kg)  Height: '5\' 7"'$  (1.702 m)  PainSc: 2    Body mass index is 27.25 kg/m.     07/28/2022    3:16 PM 12/31/2021    7:52 AM 09/13/2021    8:57 AM 07/24/2021    3:44 PM 07/10/2020    8:58 AM 06/22/2019    9:42 AM  Advanced Directives  Does Patient Have a Medical Advance Directive? Yes No Yes Yes No No  Type of AMaterials engineerof AFreescale SemiconductorPower of Attorney    Does patient want to make changes to medical advance directive?    Yes (Inpatient - patient defers changing a medical advance directive and declines information at this time)  No - Patient declined  Copy of [place] Chart? No - copy requested   No - copy requested    Would patient like information on creating a medical advance directive?     No - Patient declined     Current Medications (verified) Outpatient Encounter Medications as of 07/28/2022  Medication Sig   amitriptyline (ELAVIL) 25 MG tablet TAKE 1 TABLET AT BEDTIME   carvedilol (COREG) 12.5 MG tablet Take 1 tablet (12.5 mg total) by mouth 2 (two) times daily with a meal.   finasteride (PROSCAR) 5 MG tablet TAKE 1 TABLET DAILY   fluticasone (FLONASE) 50 MCG/ACT nasal spray USE 2 SPRAYS IN EACH NOSTRIL DAILY   ibuprofen (ADVIL) 600 MG tablet  Take 600 mg by mouth every 6 (six) hours as needed.   methocarbamol (ROBAXIN) 500 MG tablet Take by mouth.   Multiple Vitamin (MULTIVITAMIN) tablet Take 1 tablet by mouth daily.   omeprazole (PRILOSEC) 40 MG capsule TAKE 1 CAPSULE DAILY   rosuvastatin (CRESTOR) 20 MG tablet Take 1 tablet (20 mg total) by mouth daily.   Study - VICTORION-1 PREVENT - inclisiran 300 mg/1.528mor placebo SQ injection (PI-Stuckey) Inject 300 mg into the skin every 6 (six) months. For Investigational Use Only. Inject 1.5 mL subcutaneously into the abdomen, upper arm or thigh. Do not inject into areas of active skin disease, such as sunburns, skin rashes, inflammation, or skin infections. Administered on day 1, day 90, and every 6 months thereafter. Please contact [facility] for any questions or concerns regarding this medication.   tamsulosin (FLOMAX) 0.4 MG CAPS capsule TAKE 2 CAPSULES DAILY AFTER BREAKFAST   valsartan (DIOVAN) 160 MG tablet TAKE 1 TABLET DAILY   WIXELA INHUB 250-50 MCG/ACT AEPB USE 1 INHALATION TWICE A DAY (Patient taking differently: Inhale 1 puff into the lungs daily.)   influenza vaccine adjuvanted (FLUAD) 0.5 ML injection Inject into the muscle.   Facility-Administered Encounter Medications as of 07/28/2022  Medication   Study - VICTORION-1 PREVENT - inclisiran 300 mg/1.63m82mr placebo SQ injection (PI-Stuckey)    Allergies (verified) Amoxicillin and  Morphine and related   History: Past Medical History:  Diagnosis Date   Asthma    Chicken pox    GERD (gastroesophageal reflux disease)    Hyperlipidemia    Hypertension    Past Surgical History:  Procedure Laterality Date   CHOLECYSTECTOMY     TRANSFORAMINAL LUMBAR INTERBODY FUSION (TLIF) WITH PEDICLE SCREW FIXATION 1 LEVEL Right 09/25/2021   Procedure: RIGHT-SIDED TRANSFORAMINAL LUMBAR INTERBODY FUSION AND DECOMPRESSION LUMBAR 4-  LUMBAR 5 WITH INSTRUMENTATION AND ALLOGRAFT;  Surgeon: Phylliss Bob, MD;   Location: [facility];  Service: Orthopedics;  Laterality: Right;   Family History  Problem Relation Age of Onset   Alcohol abuse Mother    Depression Mother    Diabetes Mother    Alcohol abuse Father    COPD Father    Cancer Sister    Hypertension Brother    Migraines Daughter    Multiple sclerosis Daughter    Social History   Socioeconomic History   Marital status: Married    Spouse name: Not on file   Number of children: Not on file   Years of education: Not on file   Highest education level: Not on file  Occupational History   Not on file  Tobacco Use   Smoking status: Former    Types: Cigarettes    Quit date: 07/19/2010    Years since quitting: 12.0   Smokeless tobacco: Never  Vaping Use   Vaping Use: Never used  Substance and Sexual Activity   Alcohol use: Yes    Alcohol/week: 14.0 standard drinks of alcohol    Types: 14 Glasses of wine per week    Comment: 3-4 glasses of wine daily   Drug use: Never   Sexual activity: Yes    Partners: Female  Other Topics Concern   Not on file  Social History Narrative   Right handed   Social Determinants of Health   Financial Resource Strain: Low Risk  (07/28/2022)   Overall Financial Resource Strain (CARDIA)    Difficulty of Paying Living Expenses: Not hard at all  Food Insecurity: No Food Insecurity (07/28/2022)   Hunger Vital Sign    Worried About Running Out of Food in the Last Year: Never true    Ran Out of Food in the Last Year: Never true  Transportation Needs: No Transportation Needs (07/28/2022)   PRAPARE - Hydrologist (Medical): No    Lack of Transportation (Non-Medical): No  Physical Activity: Sufficiently Active (07/28/2022)   Exercise Vital Sign    Days of Exercise per Week: 3 days    Minutes of Exercise per Session: 50 min  Stress: No Stress Concern Present (07/28/2022)   [place]    Feeling of Stress : Not at all   Social Connections: Moderately Integrated (07/24/2021)   Social Connection and Isolation Panel [NHANES]    Frequency of Communication with Friends and Family: More than three times a week    Frequency of Social Gatherings with Friends and Family: Once a week    Attends Religious Services: 1 to 4 times per year    Active Member of [employer] or Organizations: No    Attends Music therapist: Never    Marital Status: Married    Tobacco Counseling Counseling given: Not Answered   Clinical Intake:  Pre-visit preparation completed: Yes  Pain : 0-10 Pain Score: 2  Pain Type: Chronic pain Pain Location: Back Pain Orientation: Lower  Pain Descriptors / Indicators: Aching Pain Onset: More than a month ago Pain Frequency: Constant     Nutritional Status: BMI 25 -29 Overweight Nutritional Risks: None Diabetes: No  How often do you need to have someone help you when you read instructions, pamphlets, or other written materials from your doctor or pharmacy?: 1 - Never  Diabetic? no  Interpreter Needed?: No  Information entered by :: NAllen LPN   Activities of Daily Living    07/28/2022    3:19 PM 07/24/2022    9:11 AM  In your present state of health, do you have any difficulty performing the following activities:  Hearing? 0 0  Vision? 0 0  Difficulty concentrating or making decisions? 0 0  Walking or climbing stairs? 0 0  Dressing or bathing? 0 0  Doing errands, shopping? 0 0  Preparing Food and eating ? N N  Using the Toilet? N N  In the past six months, have you accidently leaked urine? Y Y  Do you have problems with loss of bowel control? N N  Managing your Medications? N N  Managing your Finances? N N  Housekeeping or managing your Housekeeping? N N    Patient Care Team: Libby Maw, MD as PCP - General (Family Medicine) Donato Heinz, MD as PCP - Cardiology (Cardiology)  Indicate any recent Medical Services you may have received  from other than [facility] providers in the past year (date may be approximate).     Assessment:   This is a routine wellness examination for Jonathon Johnson.  Hearing/Vision screen Vision Screening - Comments:: Regular eye exams, Dr. Eulas Post, [facility]  Dietary issues and exercise activities discussed: Current Exercise Habits: Home exercise routine, Type of exercise: Other - see comments;strength training/weights (elliptical and stationary bike), Time (Minutes): 50, Frequency (Times/Week): 3, Weekly Exercise (Minutes/Week): 150   Goals Addressed             This Visit's Progress    Patient Stated       07/28/2022, wants to remain nimble       Depression Screen    07/28/2022    3:19 PM 02/03/2022   10:19 AM 07/31/2021   11:40 AM 07/24/2021    3:41 PM 11/08/2020    1:08 PM 08/28/2020    9:47 AM 07/10/2020    9:02 AM  PHQ 2/9 Scores  PHQ - 2 Score 0 0 0 0 0 0 0    Fall Risk    07/28/2022    3:17 PM 07/24/2022    9:11 AM 02/03/2022   10:18 AM 07/31/2021   11:40 AM 07/24/2021    3:45 PM  [place] in the past year? '1 1 1 '$ 0 0  Comment turns too quickly      Number falls in past yr: 1 1 0 0 0  Injury with Fall? 0 0 0  0  Risk for fall due to : History of fall(s);Medication side effect;Impaired balance/gait    No Fall Risks  Follow up Falls prevention discussed;Education provided;Falls evaluation completed    Falls evaluation completed    FALL RISK PREVENTION PERTAINING TO THE HOME:  Any stairs in or around the home? Yes  If so, are there any without handrails? No  Home free of loose throw rugs in walkways, pet beds, electrical cords, etc? Yes  Adequate lighting in your home to reduce risk of falls? Yes   ASSISTIVE DEVICES UTILIZED TO PREVENT FALLS:  Life alert? No  Use of a cane, walker or w/c? No  Grab bars in the bathroom? Yes  Shower chair or bench in shower? No  Elevated toilet seat or a handicapped toilet? Yes   TIMED UP AND GO:  Was the test performed? No .        Cognitive Function:        07/28/2022    3:20 PM 07/10/2020    9:09 AM  6CIT Screen  What Year? 0 points 0 points  What month? 0 points 0 points  What time? 0 points 0 points  Count back from 20 0 points 0 points  Months in reverse 0 points 0 points  Repeat phrase 2 points 0 points  Total Score 2 points 0 points    Immunizations Immunization History  Administered Date(s) Administered   Fluad Quad(high Dose 65+) 02/09/2019, 02/21/2022   Influenza-Unspecified 03/11/2018, 02/15/2020, 03/18/2021   PFIZER Comirnaty(Gray Top)Covid-19 Tri-Sucrose Vaccine 09/18/2020   PFIZER(Purple Top)SARS-COV-2 Vaccination 07/24/2019, 08/16/2019, 03/12/2020, 02/21/2021   Pfizer Covid-19 Vaccine Bivalent Booster 10yr & up 03/07/2021   Pneumococcal Conjugate-13 06/28/2019   Pneumococcal Polysaccharide-23 05/04/2012   Tdap 03/27/2009, 06/28/2019   Zoster Recombinat (Shingrix) 08/06/2021, 11/12/2021   Zoster, Live 07/16/2012    TDAP status: Up to date  Flu Vaccine status: Up to date  Pneumococcal vaccine status: Up to date  Covid-19 vaccine status: Completed vaccines  Qualifies for Shingles Vaccine? Yes   Zostavax completed Yes   Shingrix Completed?: Yes  Screening Tests Health Maintenance  Topic Date Due   OPHTHALMOLOGY EXAM  01/14/2022   COVID-19 Vaccine (7 - 2023-24 season) 02/07/2022   Medicare Annual Wellness (AWV)  07/24/2022   DTaP/Tdap/Td (3 - Td or Tdap) 06/27/2029   Pneumonia Vaccine 77 Years old  Completed   INFLUENZA VACCINE  Completed   Hepatitis C Screening  Completed   Zoster Vaccines- Shingrix  Completed   HPV VACCINES  Aged Out   COLONOSCOPY (Pts 45-47yrInsurance coverage will need to be confirmed)  Discontinued    Health Maintenance  Health Maintenance Due  Topic Date Due   OPHTHALMOLOGY EXAM  01/14/2022   COVID-19 Vaccine (7 - 2023-24 season) 02/07/2022   Medicare Annual Wellness (AWV)  07/24/2022    Colorectal cancer screening: No longer required.    Lung Cancer Screening: (Low Dose CT Chest recommended if Age 77-80ears, 30 pack-year currently smoking OR have quit w/in 15years.) does not qualify.   Lung Cancer Screening Referral: no  Additional Screening:  Hepatitis C Screening: does qualify; Completed 06/28/2019  Vision Screening: Recommended annual ophthalmology exams for early detection of glaucoma and other disorders of the eye. Is the patient up to date with their annual eye exam?  Yes  Who is the provider or what is the name of the office in which the patient attends annual eye exams? [facility], would they like to be referred to a provider to establish care? No .   Dental Screening: Recommended annual dental exams for proper oral hygiene  Community Resource Referral / Chronic Care Management: CRR required this visit?  No   CCM required this visit?  No      Plan:     I have personally reviewed and noted the following in the patient's chart:   Medical and social history Use of alcohol, tobacco or illicit drugs  Current medications and supplements including opioid prescriptions. Patient is not currently taking opioid prescriptions. Functional ability and status Nutritional status Physical  activity Advanced directives List of other physicians Hospitalizations, surgeries, and ER visits in previous 12 months Vitals Screenings to include cognitive, depression, and falls Referrals and appointments  In addition, I have reviewed and discussed with patient certain preventive protocols, quality metrics, and best practice recommendations. A written personalized care plan for preventive services as well as general preventive health recommendations were provided to patient.     Kellie Simmering, LPN   [id number]   Nurse Notes: none  Due to this being a virtual visit, the after visit summary with patients personalized plan was offered to patient via mail or my-chart.  Patient would like to access on my-chart

## 2022-08-06 ENCOUNTER — Ambulatory Visit: Payer: Medicare Other | Admitting: Family Medicine

## 2022-08-07 ENCOUNTER — Encounter: Payer: Self-pay | Admitting: Family Medicine

## 2022-08-07 ENCOUNTER — Ambulatory Visit (INDEPENDENT_AMBULATORY_CARE_PROVIDER_SITE_OTHER): Payer: Medicare Other | Admitting: Family Medicine

## 2022-08-07 VITALS — BP 157/88 | HR 78 | Temp 98.2°F | Ht 67.0 in | Wt 182.2 lb

## 2022-08-07 DIAGNOSIS — I10 Essential (primary) hypertension: Secondary | ICD-10-CM | POA: Diagnosis not present

## 2022-08-07 DIAGNOSIS — Z0000 Encounter for general adult medical examination without abnormal findings: Secondary | ICD-10-CM

## 2022-08-07 DIAGNOSIS — N401 Enlarged prostate with lower urinary tract symptoms: Secondary | ICD-10-CM | POA: Diagnosis not present

## 2022-08-07 DIAGNOSIS — E7800 Pure hypercholesterolemia, unspecified: Secondary | ICD-10-CM

## 2022-08-07 DIAGNOSIS — R3911 Hesitancy of micturition: Secondary | ICD-10-CM | POA: Diagnosis not present

## 2022-08-07 NOTE — Progress Notes (Signed)
Established Patient Office Visit   Subjective:  Patient ID: Jonathon Johnson, male    DOB: Mar 30, 1946  Age: 77 y.o. MRN: [id number]  Chief Complaint  Patient presents with   Medical Management of Chronic Issues    6 month follow up, no concerns. Patient not fasting.     HPI Encounter Diagnoses  Name Primary?   Elevated LDL cholesterol level Yes   Healthcare maintenance    White coat syndrome with diagnosis of hypertension    Benign prostatic hyperplasia with urinary hesitancy    For follow-up of the above.  Continue using research study for the cholesterol.  Of course he is not certain as to whether or not he is receiving this statin drug or saline.  Last LDL cholesterol was 103.  ASCVD is elevated to 30%.  Cardiac calcium score was over thousand.  He continues exercising regularly and is on a low-fat low-cholesterol diet.  Blood pressure at home has been running 120s over 70s.  Saw urology in November.  They advised him to continue finasteride and return on an as-needed basis.  He has regular dental care.  Last colonoscopy in 2014 was normal.  Continues taking ibuprofen as needed for stiffness and pains in his hands.   Review of Systems  Constitutional: Negative.   HENT: Negative.    Eyes:  Negative for blurred vision, discharge and redness.  Respiratory: Negative.    Cardiovascular: Negative.   Gastrointestinal:  Negative for abdominal pain.  Genitourinary: Negative.   Musculoskeletal:  Positive for joint pain. Negative for myalgias.  Skin:  Negative for rash.  Neurological:  Negative for tingling, loss of consciousness and weakness.  Endo/Heme/Allergies:  Negative for polydipsia.     Current Outpatient Medications:    amitriptyline (ELAVIL) 25 MG tablet, TAKE 1 TABLET AT BEDTIME, Disp: 90 tablet, Rfl: 3   carvedilol (COREG) 12.5 MG tablet, Take 1 tablet (12.5 mg total) by mouth 2 (two) times daily with a meal., Disp: 60 tablet, Rfl: 2   finasteride (PROSCAR) 5 MG tablet,  TAKE 1 TABLET DAILY, Disp: 90 tablet, Rfl: 3   fluticasone (FLONASE) 50 MCG/ACT nasal spray, USE 2 SPRAYS IN EACH NOSTRIL DAILY, Disp: 48 g, Rfl: 3   ibuprofen (ADVIL) 600 MG tablet, Take 600 mg by mouth every 6 (six) hours as needed., Disp: , Rfl:    methocarbamol (ROBAXIN) 500 MG tablet, Take by mouth., Disp: , Rfl:    Multiple Vitamin (MULTIVITAMIN) tablet, Take 1 tablet by mouth daily., Disp: , Rfl:    omeprazole (PRILOSEC) 40 MG capsule, TAKE 1 CAPSULE DAILY, Disp: 90 capsule, Rfl: 3   rosuvastatin (CRESTOR) 20 MG tablet, Take 1 tablet (20 mg total) by mouth daily., Disp: 90 tablet, Rfl: 0   Study - VICTORION-1 PREVENT - inclisiran 300 mg/1.33m or placebo SQ injection (PI-Stuckey), Inject 300 mg into the skin every 6 (six) months. For Investigational Use Only. Inject 1.5 mL subcutaneously into the abdomen, upper arm or thigh. Do not inject into areas of active skin disease, such as sunburns, skin rashes, inflammation, or skin infections. Administered on day 1, day 90, and every 6 months thereafter. Please contact [facility] for any questions or concerns regarding this medication., Disp: , Rfl:    tamsulosin (FLOMAX) 0.4 MG CAPS capsule, TAKE 2 CAPSULES DAILY AFTER BREAKFAST, Disp: 180 capsule, Rfl: 3   valsartan (DIOVAN) 160 MG tablet, TAKE 1 TABLET DAILY, Disp: 90 tablet, Rfl: 3   WIXELA INHUB 250-50 MCG/ACT AEPB, USE 1 INHALATION  TWICE A DAY (Patient taking differently: Inhale 1 puff into the lungs daily.), Disp: 180 each, Rfl: 3  Current Facility-Administered Medications:    Study - VICTORION-1 PREVENT - inclisiran 300 mg/1.79m or placebo SQ injection (PI-Stuckey), 300 mg, Subcutaneous, Q6 months, SHillary Bow MD, 300 mg at 03/11/22 0912   Objective:     BP (!) 157/88 (BP Location: Left Arm, Patient Position: Sitting, Cuff Size: Normal)   Pulse 78   Temp 98.2 F (36.8 C) (Temporal)   Ht '5\' 7"'$  (1.702 m)   Wt 182 lb 3.2 oz (82.6 kg)   SpO2 97%    BMI 28.54 kg/m  BP Readings from Last 3 Encounters:  08/07/22 (!) 157/88  05/09/22 (!) 164/98  03/11/22 (!) 168/86   Wt Readings from Last 3 Encounters:  08/07/22 182 lb 3.2 oz (82.6 kg)  07/28/22 174 lb (78.9 kg)  03/11/22 177 lb (80.3 kg)      Physical Exam Constitutional:      General: He is not in acute distress.    Appearance: Normal appearance. He is not ill-appearing, toxic-appearing or diaphoretic.  HENT:     Head: Normocephalic and atraumatic.     Right Ear: External ear normal.     Left Ear: External ear normal.     Mouth/Throat:     Mouth: Mucous membranes are moist.     Pharynx: Oropharynx is clear. No oropharyngeal exudate or posterior oropharyngeal erythema.  Eyes:     General: No scleral icterus.       Right eye: No discharge.        Left eye: No discharge.     Extraocular Movements: Extraocular movements intact.     Conjunctiva/sclera: Conjunctivae normal.     Pupils: Pupils are equal, round, and reactive to light.  Cardiovascular:     Rate and Rhythm: Normal rate and regular rhythm.  Pulmonary:     Effort: Pulmonary effort is normal. No respiratory distress.     Breath sounds: Normal breath sounds.  Abdominal:     General: Bowel sounds are normal.     Tenderness: There is no abdominal tenderness. There is no guarding.  Musculoskeletal:     Cervical back: No rigidity or tenderness.  Skin:    General: Skin is warm and dry.  Neurological:     Mental Status: He is alert and oriented to person, place, and time.  Psychiatric:        Mood and Affect: Mood normal.        Behavior: Behavior normal.      No results found for any visits on 08/07/22.    The 10-year ASCVD risk score (Arnett DK, et al., 2019) is: 35.4%    Assessment & Plan:   Elevated LDL cholesterol level -     Comprehensive metabolic panel; Future -     Lipid panel; Future  Healthcare maintenance  White coat syndrome with diagnosis of hypertension -     CBC; Future -      Comprehensive metabolic panel; Future -     Urinalysis, Routine w reflex microscopic; Future  Benign prostatic hyperplasia with urinary hesitancy -     PSA; Future    Return in about 6 months (around 02/05/2023), or Please bring your cuff with you to next visit..  Not interested in seeing hand surgeon at this time.   WLibby Maw MD

## 2022-08-08 ENCOUNTER — Other Ambulatory Visit (INDEPENDENT_AMBULATORY_CARE_PROVIDER_SITE_OTHER): Payer: Medicare Other

## 2022-08-08 DIAGNOSIS — I10 Essential (primary) hypertension: Secondary | ICD-10-CM

## 2022-08-08 DIAGNOSIS — E7800 Pure hypercholesterolemia, unspecified: Secondary | ICD-10-CM

## 2022-08-08 DIAGNOSIS — R3911 Hesitancy of micturition: Secondary | ICD-10-CM | POA: Diagnosis not present

## 2022-08-08 DIAGNOSIS — N401 Enlarged prostate with lower urinary tract symptoms: Secondary | ICD-10-CM | POA: Diagnosis not present

## 2022-08-08 LAB — LIPID PANEL
Cholesterol: 174 mg/dL (ref 0–200)
HDL: 81 mg/dL (ref >39.00)
LDL Cholesterol: 76 mg/dL (ref 0–99)
NonHDL: 93.39
Total CHOL/HDL Ratio: 2
Triglycerides: 89 mg/dL (ref 0.0–149.0)
VLDL: 17.8 mg/dL (ref 0.0–40.0)

## 2022-08-08 LAB — URINALYSIS, ROUTINE W REFLEX MICROSCOPIC
Bilirubin Urine: NEGATIVE
Hgb urine dipstick: NEGATIVE
Ketones, ur: NEGATIVE
Leukocytes,Ua: NEGATIVE
Nitrite: NEGATIVE
Specific Gravity, Urine: 1.02 (ref 1.000–1.030)
Total Protein, Urine: NEGATIVE
Urine Glucose: NEGATIVE
Urobilinogen, UA: 0.2 (ref 0.0–1.0)
pH: 6 (ref 5.0–8.0)

## 2022-08-08 LAB — COMPREHENSIVE METABOLIC PANEL
ALT: 31 U/L (ref 0–53)
AST: 33 U/L (ref 0–37)
Albumin: 4.2 g/dL (ref 3.5–5.2)
Alkaline Phosphatase: 51 U/L (ref 39–117)
BUN: 14 mg/dL (ref 6–23)
CO2: 28 mEq/L (ref 19–32)
Calcium: 9.5 mg/dL (ref 8.4–10.5)
Chloride: 97 mEq/L (ref 96–112)
Creatinine, Ser: 0.86 mg/dL (ref 0.40–1.50)
GFR: 84.22 mL/min (ref >60.00)
Glucose, Bld: 85 mg/dL (ref 70–99)
Potassium: 5.1 mEq/L (ref 3.5–5.1)
Sodium: 134 mEq/L — ABNORMAL LOW (ref 135–145)
Total Bilirubin: 0.4 mg/dL (ref 0.2–1.2)
Total Protein: 6.7 g/dL (ref 6.0–8.3)

## 2022-08-08 LAB — CBC
HCT: 41.1 % (ref 39.0–52.0)
Hemoglobin: 14.3 g/dL (ref 13.0–17.0)
MCHC: 34.9 g/dL (ref 30.0–36.0)
MCV: 93.9 fl (ref 78.0–100.0)
Platelets: 285 10*3/uL (ref 150.0–400.0)
RBC: 4.37 Mil/uL (ref 4.22–5.81)
RDW: 13.2 % (ref 11.5–15.5)
WBC: 5.7 10*3/uL (ref 4.0–10.5)

## 2022-08-08 LAB — PSA: PSA: 1.01 ng/mL (ref 0.10–4.00)

## 2022-08-14 ENCOUNTER — Other Ambulatory Visit: Payer: Medicare Other

## 2022-08-17 NOTE — Progress Notes (Addendum)
Cardiology Office Note:    Date:  08/19/2022   ID:  Jonathon Johnson, DOB 1945/06/10, MRN [id number]  PCP:  Libby Maw, MD  Cardiologist:  Donato Heinz, MD  Electrophysiologist:  None   Referring MD: Libby Maw,*   Chief Complaint  Patient presents with   Coronary Artery Disease     History of Present Illness:    Jonathon Johnson is a 77 y.o. male with a hx of asthma, hypertension, hyperlipidemia, GERD who presents for follow-up. He was referred by Dr. Ethelene Hal for evaluation of tachycardia, initially seen on 12/15/2019. He was seen in the ED on 12/07/2019 for tachycardia.  He reported having palpitations and feeling like his heart was racing.  Checked his pulse and it was in the 130s.  In the ED, pulse remained in 120s, sinus tachycardia.  Lab work showed normal electrolytes,TSH, and negative troponin x2.  Age-adjusted D-dimer was negative.  He was given IV fluids, p.o. Ativan and IV metoprolol.  Heart rate improved to 80s to 90s.  Echocardiogram on 01/04/2020 showed normal biventricular function, no significant valvular disease. Zio patch x7 days on 01/12/2020 showed 23 episodes of SVT, longest lasting 17 beats. Patient triggered events corresponded to sinus rhythm  PACs/PVCs.  Calcium score 1212 (81st percentile) on 05/17/2021.  Since last clinic visit, he reports that he is doing well.  He denies any chest pain but does report he has been having dyspnea with exertion.  He works out at gym 3 days/week, does cardio and weight machines for total of about 45 to 60 minutes.  Denies any exertional chest pain but does report has been having shortness of breath with this.  Continues to have intermittent palpitations, stable from prior.  Denies any lower extremity edema.  He continues to drink 4 to 5 glasses of wine per night.   BP Readings from Last 3 Encounters:  08/19/22 126/72  08/07/22 (!) 157/88  05/09/22 (!) 164/98     Past Medical History:  Diagnosis Date    Asthma    Chicken pox    GERD (gastroesophageal reflux disease)    Hyperlipidemia    Hypertension     Past Surgical History:  Procedure Laterality Date   CHOLECYSTECTOMY     TRANSFORAMINAL LUMBAR INTERBODY FUSION (TLIF) WITH PEDICLE SCREW FIXATION 1 LEVEL Right 09/25/2021   Procedure: RIGHT-SIDED TRANSFORAMINAL LUMBAR INTERBODY FUSION AND DECOMPRESSION LUMBAR 4-  LUMBAR 5 WITH INSTRUMENTATION AND ALLOGRAFT;  Surgeon: Phylliss Bob, MD;  Location: [facility];  Service: Orthopedics;  Laterality: Right;    Current Medications: Current Meds  Medication Sig   amitriptyline (ELAVIL) 25 MG tablet TAKE 1 TABLET AT BEDTIME   aspirin EC 81 MG tablet Take 81 mg by mouth daily. Swallow whole.   carvedilol (COREG) 12.5 MG tablet Take 1 tablet (12.5 mg total) by mouth 2 (two) times daily with a meal.   finasteride (PROSCAR) 5 MG tablet TAKE 1 TABLET DAILY   fluticasone (FLONASE) 50 MCG/ACT nasal spray USE 2 SPRAYS IN EACH NOSTRIL DAILY   ibuprofen (ADVIL) 600 MG tablet Take 600 mg by mouth every 6 (six) hours as needed.   methocarbamol (ROBAXIN) 500 MG tablet Take by mouth every 6 (six) hours as needed.   Multiple Vitamin (MULTIVITAMIN) tablet Take 1 tablet by mouth daily.   omeprazole (PRILOSEC) 40 MG capsule TAKE 1 CAPSULE DAILY   rosuvastatin (CRESTOR) 20 MG tablet Take 1 tablet (20 mg total) by mouth daily.   Study - [place] 300  mg/1.29m or placebo SQ injection (PI-Stuckey) Inject 300 mg into the skin every 6 (six) months. For Investigational Use Only. Inject 1.5 mL subcutaneously into the abdomen, upper arm or thigh. Do not inject into areas of active skin disease, such as sunburns, skin rashes, inflammation, or skin infections. Administered on day 1, day 90, and every 6 months thereafter. Please contact [facility] for any questions or concerns regarding this medication.   tamsulosin (FLOMAX) 0.4 MG CAPS capsule TAKE 2 CAPSULES DAILY AFTER  BREAKFAST   valsartan (DIOVAN) 160 MG tablet TAKE 1 TABLET DAILY   WIXELA INHUB 250-50 MCG/ACT AEPB USE 1 INHALATION TWICE A DAY (Patient taking differently: Inhale 1 puff into the lungs daily.)   Current Facility-Administered Medications for the 08/19/22 encounter (Office Visit) with SDonato Heinz MD  Medication   Study - VICTORION-1 PREVENT - inclisiran 300 mg/1.574mor placebo SQ injection (PI-Stuckey)     Allergies:   Amoxicillin and Morphine and related   Social History   Socioeconomic History   Marital status: Married    Spouse name: Not on file   Number of children: Not on file   Years of education: Not on file   Highest education level: Not on file  Occupational History   Not on file  Tobacco Use   Smoking status: Former    Types: Cigarettes    Quit date: 07/19/2010    Years since quitting: 12.0   Smokeless tobacco: Never  Vaping Use   Vaping Use: Never used  Substance and Sexual Activity   Alcohol use: Yes    Alcohol/week: 14.0 standard drinks of alcohol    Types: 14 Glasses of wine per week    Comment: 3-4 glasses of wine daily   Drug use: Never   Sexual activity: Yes    Partners: Female  Other Topics Concern   Not on file  Social History Narrative   Right handed   Social Determinants of Health   Financial Resource Strain: Low Risk  (07/28/2022)   Overall Financial Resource Strain (CARDIA)    Difficulty of Paying Living Expenses: Not hard at all  Food Insecurity: No Food Insecurity (07/28/2022)   Hunger Vital Sign    Worried About Running Out of Food in the Last Year: Never true    Ran Out of Food in the Last Year: Never true  Transportation Needs: No Transportation Needs (07/28/2022)   PRAPARE - TrHydrologistMedical): No    Lack of Transportation (Non-Medical): No  Physical Activity: Sufficiently Active (07/28/2022)   Exercise Vital Sign    Days of Exercise per Week: 3 days    Minutes of Exercise per Session: 50 min   Stress: No Stress Concern Present (07/28/2022)   [place]  Feeling of Stress : Not at all  Social Connections: Moderately Integrated (07/24/2021)   Social Connection and Isolation Panel [NHANES]    Frequency of Communication with Friends and Family: More than three times a week    Frequency of Social Gatherings with Friends and Family: Once a week    Attends Religious Services: 1 to 4 times per year    Active Member of [employer] Organizations: No    Attends ClMusic therapistNever    Marital Status: Married     Family History: The patient's family history includes Alcohol abuse in his father and mother; COPD in his father; Cancer in his sister;  Depression in his mother; Diabetes in his mother; Hypertension in his brother; Migraines in his daughter; Multiple sclerosis in his daughter.  ROS:   Please see the history of present illness.     All other systems reviewed and are negative.  EKGs/Labs/Other Studies Reviewed:    The following studies were reviewed today:   EKG:   04/18/21: Normal sinus rhythm, rate 87, no ST abnormality 08/19/2022: Sinus rhythm with PACs, low voltage, rate 79    Recent Labs: 08/08/2022: ALT 31; BUN 14; Creatinine, Ser 0.86; Hemoglobin 14.3; Platelets 285.0; Potassium 5.1; Sodium 134  Recent Lipid Panel    Component Value Date/Time   CHOL 174 08/08/2022 0747   TRIG 89.0 08/08/2022 0747   HDL 81.00 08/08/2022 0747   CHOLHDL 2 08/08/2022 0747   VLDL 17.8 08/08/2022 0747   LDLCALC 76 08/08/2022 0747   LDLDIRECT 101.0 08/01/2021 0923    Physical Exam:    VS:  BP 126/72   Pulse 79   Ht  (1.702 m)   Wt 180 lb 6.4 oz (81.8 kg)   SpO2 98%   BMI 28.25 kg/m     Wt Readings from Last 3 Encounters:  08/19/22 180 lb 6.4 oz (81.8 kg)  08/07/22 182 lb 3.2 oz (82.6 kg)  07/28/22 174 lb (78.9 kg)     GEN:  in no acute distress HEENT: Normal NECK: No JVD; No carotid  bruits CARDIAC: RRR, no murmurs, rubs, gallops RESPIRATORY:  Clear to auscultation without rales, wheezing or rhonchi  ABDOMEN: Soft, non-tender, non-distended MUSCULOSKELETAL:  No edema; No deformity  SKIN: Warm and dry NEUROLOGIC:  Alert and oriented x 3 PSYCHIATRIC:  Normal affect   ASSESSMENT:    1. Dyspnea, unspecified type   2. Essential hypertension   3. Coronary artery calcification   4. Hyperlipidemia, unspecified hyperlipidemia type     PLAN:    CAD: Calcium score 1212 (81st percentile) on 05/17/2021.  Denies any chest pain but reports dyspnea on exertion, which may represent anginal equivalent -Recommend stress PET -Start aspirin 81 mg daily -Participating in [place] study, on inclisiran versus placebo, in addition to rosuvastatin 20 mg daily  Tachycardia/palpitations/lightheadedness: Description concerning for arrhythmia.  Echocardiogram on 01/04/2020 showed normal biventricular function, no significant valvular disease. Zio patch x7 days on 01/12/2020 showed 23 episodes of SVT, longest lasting 17 beats. Patient triggered events corresponded to sinus rhythm  PACs/PVCs.  Suspect alcohol use (4 to 5 glasses of wine per night) could be contributing, recommended cutting back on alcohol intake.  He denies any history of withdrawal symptoms.  Continue carvedilol 12.5 mg twice daily.  Reports palpitations have improved.  Hypertension: On losartan 100 mg daily and carvedilol 12.5 mg twice daily.  Appears controlled  Hyperlipidemia: 10-year ASCVD risk score 21%. LDL 108 on 11/29/2019.  Started rosuvastatin 10 mg daily, LDL 91 on 09/28/2020.  Calcium score 1212 (81st percentile) on 05/17/2021.  Participating in [place] study, on inclisiran versus placebo, in addition to rosuvastatin 20 mg daily.  LDL 76 on 08/08/2022  RTC in 6 months  Shared Decision Making/Informed Consent The risks [chest pain, shortness of breath, cardiac arrhythmias, dizziness, blood pressure fluctuations,  myocardial infarction, stroke/transient ischemic attack, nausea, vomiting, allergic reaction, radiation exposure, metallic taste sensation and life-threatening complications (estimated to be 1 in 10,000)], benefits (risk stratification, diagnosing coronary artery disease, treatment guidance) and alternatives of a cardiac PET stress test were discussed in detail with Mr. Bowring and he agrees to proceed.   Medication Adjustments/Labs and Tests  Ordered: Current medicines are reviewed at length with the patient today.  Concerns regarding medicines are outlined above.  Orders Placed This Encounter  Procedures   NM PET CT CARDIAC PERFUSION MULTI W/ABSOLUTE BLOODFLOW   EKG 12-Lead    No orders of the defined types were placed in this encounter.    Patient Instructions  Medication Instructions:  START aspirin 81mg  daily  *If you need a refill on your cardiac medications before your next appointment, please call your pharmacy*  Testing/Procedures: Cardiac PET Test at [facility]   Follow-Up: At [facility], you and your health needs are our priority.  As part of our continuing mission to provide you with exceptional heart care, we have created designated Provider Care Teams.  These Care Teams include your primary Cardiologist (physician) and Advanced Practice Providers (APPs -  Physician Assistants and Nurse Practitioners) who all work together to provide you with the care you need, when you need it.  We recommend signing up for the patient portal called "MyChart".  Sign up information is provided on this After Visit Summary.  MyChart is used to connect with patients for Virtual Visits (Telemedicine).  Patients are able to view lab/test results, encounter notes, upcoming appointments, etc.  Non-urgent messages can be sent to your provider as well.   To learn more about what you can do with MyChart, go to [email or web address].    Your next appointment:    6 months with Dr.  Bjorn Pippin   How to Prepare for Your Cardiac PET/CT Stress Test:  1. Please do not take these medications before your test:   Medications that may interfere with the cardiac pharmacological stress agent (ex. nitrates - including erectile dysfunction medications, isosorbide mononitrate, tamulosin or beta-blockers) the day of the exam. (Erectile dysfunction medication should be held for at least 72 hrs prior to test) Theophylline containing medications for 12 hours. Dipyridamole 48 hours prior to the test. Your remaining medications may be taken with water.  2. Nothing to eat or drink, except water, 3 hours prior to arrival time.   NO caffeine/decaffeinated products, or chocolate 12 hours prior to arrival.  3. NO perfume, cologne or lotion  4. Total time is 1 to 2 hours; you may want to bring reading material for the waiting time.  5. Please report to Radiology at the [facility] Main Entrance 30 minutes early for your test. [address] [place], [place] [address]  Diabetic Preparation:  Hold oral medications. You may take NPH and Lantus insulin. Do not take Humalog or Humulin R (Regular Insulin) the day of your test. Check blood sugars prior to leaving the house. If able to eat breakfast prior to 3 hour fasting, you may take all medications, including your insulin, Do not worry if you miss your breakfast dose of insulin - start at your next meal.  IF YOU THINK YOU MAY BE PREGNANT, OR ARE NURSING PLEASE INFORM THE TECHNOLOGIST.  In preparation for your appointment, medication and supplies will be purchased.  Appointment availability is limited, so if you need to cancel or reschedule, please call the Radiology Department at [phone]  24 hours in advance to avoid a cancellation fee of $100.00  What to Expect After you Arrive:  Once you arrive and check in for your appointment, you will be taken to a preparation room within the Radiology Department.  A technologist or  Nurse will obtain your medical history, verify that you are correctly prepped for the exam, and explain  the procedure.  Afterwards,  an IV will be started in your arm and electrodes will be placed on your skin for EKG monitoring during the stress portion of the exam. Then you will be escorted to the PET/CT scanner.  There, staff will get you positioned on the scanner and obtain a blood pressure and EKG.  During the exam, you will continue to be connected to the EKG and blood pressure machines.  A small, safe amount of a radioactive tracer will be injected in your IV to obtain a series of pictures of your heart along with an injection of a stress agent.    After your Exam:  It is recommended that you eat a meal and drink a caffeinated beverage to counter act any effects of the stress agent.  Drink plenty of fluids for the remainder of the day and urinate frequently for the first couple of hours after the exam.  Your doctor will inform you of your test results within 7-10 business days.  For questions about your test or how to prepare for your test, please call: Rockwell Alexandria, Cardiac Imaging Nurse Navigator  Larey Brick, Cardiac Imaging Nurse Navigator Office: [phone]    Signed, Little Ishikawa, MD  08/19/2022 9:25 AM    [facility]

## 2022-08-19 ENCOUNTER — Ambulatory Visit: Payer: Medicare Other | Attending: Cardiology | Admitting: Cardiology

## 2022-08-19 ENCOUNTER — Encounter: Payer: Self-pay | Admitting: Cardiology

## 2022-08-19 VITALS — BP 126/72 | HR 79 | Ht 67.0 in | Wt 180.4 lb

## 2022-08-19 DIAGNOSIS — R0600 Dyspnea, unspecified: Secondary | ICD-10-CM | POA: Diagnosis not present

## 2022-08-19 DIAGNOSIS — I2584 Coronary atherosclerosis due to calcified coronary lesion: Secondary | ICD-10-CM | POA: Insufficient documentation

## 2022-08-19 DIAGNOSIS — E785 Hyperlipidemia, unspecified: Secondary | ICD-10-CM | POA: Diagnosis not present

## 2022-08-19 DIAGNOSIS — I10 Essential (primary) hypertension: Secondary | ICD-10-CM | POA: Diagnosis not present

## 2022-08-19 DIAGNOSIS — I2510 Atherosclerotic heart disease of native coronary artery without angina pectoris: Secondary | ICD-10-CM | POA: Diagnosis not present

## 2022-08-19 NOTE — Patient Instructions (Addendum)
Medication Instructions:  START aspirin '81mg'$  daily  *If you need a refill on your cardiac medications before your next appointment, please call your pharmacy*  Testing/Procedures: Cardiac PET Test at [place]: At [facility], you and your health needs are our priority.  As part of our continuing mission to provide you with exceptional heart care, we have created designated Provider Care Teams.  These Care Teams include your primary Cardiologist (physician) and Advanced Practice Providers (APPs -  Physician Assistants and Nurse Practitioners) who all work together to provide you with the care you need, when you need it.  We recommend signing up for the patient portal called "MyChart".  Sign up information is provided on this After Visit Summary.  MyChart is used to connect with patients for Virtual Visits (Telemedicine).  Patients are able to view lab/test results, encounter notes, upcoming appointments, etc.  Non-urgent messages can be sent to your provider as well.   To learn more about what you can do with MyChart, go to [email or web address].    Your next appointment:    6 months with Dr. Gardiner Rhyme   How to Prepare for Your Cardiac PET/CT Stress Test:  1. Please do not take these medications before your test:   Medications that may interfere with the cardiac pharmacological stress agent (ex. nitrates - including erectile dysfunction medications, isosorbide mononitrate, tamulosin or beta-blockers) the day of the exam. (Erectile dysfunction medication should be held for at least 72 hrs prior to test) Theophylline containing medications for 12 hours. Dipyridamole 48 hours prior to the test. Your remaining medications may be taken with water.  2. Nothing to eat or drink, except water, 3 hours prior to arrival time.   NO caffeine/decaffeinated products, or chocolate 12 hours prior to arrival.  3. NO perfume, cologne or lotion  4. Total time is 1 to 2 hours;  you may want to bring reading material for the waiting time.  5. Please report to Radiology at the [facility] Main Entrance 30 minutes early for your test. [place], [place] [address]  Diabetic Preparation:  Hold oral medications. You may take NPH and Lantus insulin. Do not take Humalog or Humulin R (Regular Insulin) the day of your test. Check blood sugars prior to leaving the house. If able to eat breakfast prior to 3 hour fasting, you may take all medications, including your insulin, Do not worry if you miss your breakfast dose of insulin - start at your next meal.  IF YOU THINK YOU MAY BE PREGNANT, OR ARE NURSING PLEASE INFORM THE TECHNOLOGIST.  In preparation for your appointment, medication and supplies will be purchased.  Appointment availability is limited, so if you need to cancel or reschedule, please call the Radiology Department at [phone]  24 hours in advance to avoid a cancellation fee of $100.00  What to Expect After you Arrive:  Once you arrive and check in for your appointment, you will be taken to a preparation room within the Radiology Department.  A technologist or Nurse will obtain your medical history, verify that you are correctly prepped for the exam, and explain the procedure.  Afterwards,  an IV will be started in your arm and electrodes will be placed on your skin for EKG monitoring during the stress portion of the exam. Then you will be escorted to the PET/CT scanner.  There, staff will get you positioned on the scanner and obtain a blood pressure and EKG.  During the  exam, you will continue to be connected to the EKG and blood pressure machines.  A small, safe amount of a radioactive tracer will be injected in your IV to obtain a series of pictures of your heart along with an injection of a stress agent.    After your Exam:  It is recommended that you eat a meal and drink a caffeinated beverage to counter act any effects of the  stress agent.  Drink plenty of fluids for the remainder of the day and urinate frequently for the first couple of hours after the exam.  Your doctor will inform you of your test results within 7-10 business days.  For questions about your test or how to prepare for your test, please call: Marchia Bond, Cardiac Imaging Nurse Navigator  Gordy Clement, Cardiac Imaging Nurse Navigator Office: [phone]

## 2022-08-21 DIAGNOSIS — H35033 Hypertensive retinopathy, bilateral: Secondary | ICD-10-CM | POA: Diagnosis not present

## 2022-08-21 DIAGNOSIS — H18593 Other hereditary corneal dystrophies, bilateral: Secondary | ICD-10-CM | POA: Diagnosis not present

## 2022-08-21 DIAGNOSIS — H2513 Age-related nuclear cataract, bilateral: Secondary | ICD-10-CM | POA: Diagnosis not present

## 2022-09-02 ENCOUNTER — Encounter: Payer: Self-pay | Admitting: *Deleted

## 2022-09-04 ENCOUNTER — Other Ambulatory Visit: Payer: Self-pay

## 2022-09-04 ENCOUNTER — Encounter: Payer: Medicare Other | Admitting: *Deleted

## 2022-09-04 DIAGNOSIS — Z006 Encounter for examination for normal comparison and control in clinical research program: Secondary | ICD-10-CM

## 2022-09-04 MED ORDER — STUDY - VICTORION-1 PREVENT - INCLISIRAN 300 MG/1.5 ML OR PLACEBO SQ INJECTION (PI-STUCKEY)
300.0000 mg | INJECTION | SUBCUTANEOUS | Status: DC
  Filled 2022-09-04: qty 1.5

## 2022-09-04 NOTE — Research (Addendum)
DATE: 09-04-2022  SUBJECT ID: [id number]   Visit: V3        Day: 270          Prior or concomitant medications reviewed [x]   Lipid lowering medications reviewed [x]   Surgical history reviewed [x]   TIME: BP: PULSE:  0830 155/81 77  0832 149/80 70  0834 137/89 70  Mean BP: 147/83   Mean Pulse: 72  Labs collected at: 0839 Fasting Lipid Profile [x]    AE/SAE assessment completed [x]   Study drug administered [x]   Jonathon Johnson is here for V3 Day 270 of the V1P research study. He reports that he feels good and is eating better. No visits to the ED or Urgent care since last seen. Scheduled next visit for Sept 27 at 0830. Blood drawn at 0839.  Injection given in left lower abd at [place] number [id number]. Tol well. Ask Jonathon Johnson when GERD was diagnosed states 2014, Asthma in 2018, and HTN in 2021.   Current Outpatient Medications:    amitriptyline (ELAVIL) 25 MG tablet, TAKE 1 TABLET AT BEDTIME, Disp: 90 tablet, Rfl: 3   aspirin EC 81 MG tablet, Take 81 mg by mouth daily. Swallow whole., Disp: , Rfl:    carvedilol (COREG) 12.5 MG tablet, Take 1 tablet (12.5 mg total) by mouth 2 (two) times daily with a meal., Disp: 60 tablet, Rfl: 2   finasteride (PROSCAR) 5 MG tablet, TAKE 1 TABLET DAILY, Disp: 90 tablet, Rfl: 3   fluticasone (FLONASE) 50 MCG/ACT nasal spray, USE 2 SPRAYS IN EACH NOSTRIL DAILY, Disp: 48 g, Rfl: 3   ibuprofen (ADVIL) 600 MG tablet, Take 600 mg by mouth every 6 (six) hours as needed., Disp: , Rfl:    methocarbamol (ROBAXIN) 500 MG tablet, Take by mouth every 6 (six) hours as needed., Disp: , Rfl:    Multiple Vitamin (MULTIVITAMIN) tablet, Take 1 tablet by mouth daily., Disp: , Rfl:    omeprazole (PRILOSEC) 40 MG capsule, TAKE 1 CAPSULE DAILY, Disp: 90 capsule, Rfl: 3   rosuvastatin (CRESTOR) 20 MG tablet, Take 1 tablet (20 mg total) by mouth daily., Disp: 90 tablet, Rfl: 0   Study - VICTORION-1 PREVENT - inclisiran 300 mg/1.21mL or placebo SQ injection (PI-Stuckey), Inject 300 mg  into the skin every 6 (six) months. For Investigational Use Only. Inject 1.5 mL subcutaneously into the abdomen, upper arm or thigh. Do not inject into areas of active skin disease, such as sunburns, skin rashes, inflammation, or skin infections. Administered on day 1, day 90, and every 6 months thereafter. Please contact [facility] for any questions or concerns regarding this medication., Disp: , Rfl:    tamsulosin (FLOMAX) 0.4 MG CAPS capsule, TAKE 2 CAPSULES DAILY AFTER BREAKFAST, Disp: 180 capsule, Rfl: 3   valsartan (DIOVAN) 160 MG tablet, TAKE 1 TABLET DAILY, Disp: 90 tablet, Rfl: 3   WIXELA INHUB 250-50 MCG/ACT AEPB, USE 1 INHALATION TWICE A DAY (Patient taking differently: Inhale 1 puff into the lungs daily.), Disp: 180 each, Rfl: 3  Current Facility-Administered Medications:    Study - VICTORION-1 PREVENT - inclisiran 300 mg/1.76mL or placebo SQ injection (PI-Stuckey), 300 mg, Subcutaneous, Q6 months, Hillary Bow, MD     Re consent Subject met inclusion and exclusion criteria.  The informed consent form, study requirements and expectations were reviewed with the subject and questions and concerns were addressed prior to the signing of the consent form.  The subject verbalized understanding of the trial requirements.  The subject agreed  to participate in the [place] 1 Prevent trial and signed the informed consent on 04-Sep-2022 at 0906  Informed consent was obtained prior to performance of any protocol-specific procedures. A copy of the signed informed consent was given to the subject and original copy was placed in the subject's medical record.    V1P Study Drug Administration  Subject ID #: [id number] Kit #: [id number] Administration Site: left lower abd

## 2022-09-10 NOTE — Research (Addendum)
Are there any labs that are clinically significant?  Yes [x]  OR No[] 

## 2022-09-10 NOTE — Research (Signed)
V1P Informed Consent   Subject Name: Jonathon Johnson  Subject met inclusion and exclusion criteria.  The informed consent form, study requirements and expectations were reviewed with the subject and questions and concerns were addressed prior to the signing of the consent form.  The subject verbalized understanding of the trial requirements.  The subject agreed to participate in the V1P trial and signed the informed consent on 05/08/2022.  The informed consent was obtained prior to performance of any protocol-specific procedures for the subject.  A copy of the signed informed consent was given to the subject and a copy was placed in the subject's medical record.     Subject re-consented to  Version: 01.01.02 ICF Version Date: 16-Jan-2022 IRB approved: 02/21/2022  Philemon Kingdom D

## 2022-09-19 NOTE — Addendum Note (Signed)
Addended by: Johney Frame A on: 09/19/2022 12:21 PM   Modules accepted: Orders

## 2022-09-22 ENCOUNTER — Telehealth (HOSPITAL_COMMUNITY): Payer: Self-pay | Admitting: *Deleted

## 2022-09-22 NOTE — Addendum Note (Signed)
Addended by: Epifanio Lesches on: 09/22/2022 06:30 AM   Modules accepted: Orders

## 2022-09-22 NOTE — Telephone Encounter (Signed)
Reaching out to patient to offer assistance regarding upcoming cardiac imaging study; pt verbalizes understanding of appt date/time, parking situation and where to check in, pre-test NPO status and verified current allergies; name and call back number provided for further questions should they arise  Jonavan Vanhorn RN Navigator Cardiac Imaging [facility] [phone] office [phone] cell  Patient aware to avoid caffeine 12 hours prior his cardiac PET scan.  

## 2022-09-24 ENCOUNTER — Ambulatory Visit (HOSPITAL_COMMUNITY)
Admission: RE | Admit: 2022-09-24 | Discharge: 2022-09-24 | Disposition: A | Discharge status: Home / Self Care | Payer: Medicare Other | Source: Ambulatory Visit | Attending: Cardiology | Admitting: Cardiology

## 2022-09-24 DIAGNOSIS — R0600 Dyspnea, unspecified: Secondary | ICD-10-CM | POA: Insufficient documentation

## 2022-09-24 DIAGNOSIS — I2584 Coronary atherosclerosis due to calcified coronary lesion: Secondary | ICD-10-CM

## 2022-09-24 DIAGNOSIS — I2510 Atherosclerotic heart disease of native coronary artery without angina pectoris: Secondary | ICD-10-CM | POA: Diagnosis not present

## 2022-09-24 LAB — NM PET CT CARDIAC PERFUSION MULTI W/ABSOLUTE BLOODFLOW
LV dias vol: 83 mL (ref 62–150)
LV sys vol: 35 mL
MBFR: 1.75
Nuc Rest EF: 62 %
Nuc Stress EF: 58 %
Rest MBF: 1.36 ml/g/min
Rest Nuclear Isotope Dose: 20.5 mCi
ST Depression (mm): 0 mm
Stress MBF: 2.38 ml/g/min
Stress Nuclear Isotope Dose: 20.5 mCi
TID: 0.98

## 2022-09-24 MED ORDER — REGADENOSON 0.4 MG/5ML IV SOLN
INTRAVENOUS | Status: AC
  Filled 2022-09-24: qty 5

## 2022-09-24 MED ORDER — RUBIDIUM RB82 GENERATOR (RUBYFILL)
20.4700 | PACK | Freq: Once | INTRAVENOUS | Status: AC
  Administered 2022-09-24: 20.47 via INTRAVENOUS

## 2022-09-24 MED ORDER — REGADENOSON 0.4 MG/5ML IV SOLN
0.4000 mg | Freq: Once | INTRAVENOUS | Status: AC
  Administered 2022-09-24: 0.4 mg via INTRAVENOUS

## 2022-09-24 MED ORDER — RUBIDIUM RB82 GENERATOR (RUBYFILL)
20.5000 | PACK | Freq: Once | INTRAVENOUS | Status: AC
  Administered 2022-09-24: 20.45 via INTRAVENOUS

## 2022-09-24 NOTE — Progress Notes (Signed)
Patient presents for a cardiac PET stress test and tolerated procedure without incident. Patient maintained acceptable vital signs throughout the test and was offered caffeine after test.  Patient ambulated out of department with a steady gait.  

## 2022-10-02 ENCOUNTER — Other Ambulatory Visit: Payer: Self-pay | Admitting: Cardiology

## 2022-10-02 ENCOUNTER — Encounter: Payer: Self-pay | Admitting: Cardiology

## 2022-10-14 ENCOUNTER — Encounter: Payer: Self-pay | Admitting: *Deleted

## 2022-10-29 ENCOUNTER — Other Ambulatory Visit: Payer: Self-pay | Admitting: Family Medicine

## 2022-11-05 ENCOUNTER — Ambulatory Visit: Payer: Medicare Other | Admitting: Family Medicine

## 2022-12-15 DIAGNOSIS — H2513 Age-related nuclear cataract, bilateral: Secondary | ICD-10-CM | POA: Diagnosis not present

## 2022-12-15 DIAGNOSIS — H18593 Other hereditary corneal dystrophies, bilateral: Secondary | ICD-10-CM | POA: Diagnosis not present

## 2022-12-15 DIAGNOSIS — H35033 Hypertensive retinopathy, bilateral: Secondary | ICD-10-CM | POA: Diagnosis not present

## 2022-12-30 DIAGNOSIS — H25811 Combined forms of age-related cataract, right eye: Secondary | ICD-10-CM | POA: Diagnosis not present

## 2023-01-07 ENCOUNTER — Encounter (INDEPENDENT_AMBULATORY_CARE_PROVIDER_SITE_OTHER): Payer: Self-pay

## 2023-01-19 ENCOUNTER — Other Ambulatory Visit: Payer: Self-pay | Admitting: Family Medicine

## 2023-02-02 ENCOUNTER — Other Ambulatory Visit: Payer: Self-pay | Admitting: Family Medicine

## 2023-02-02 DIAGNOSIS — R3911 Hesitancy of micturition: Secondary | ICD-10-CM

## 2023-02-02 DIAGNOSIS — N401 Enlarged prostate with lower urinary tract symptoms: Secondary | ICD-10-CM

## 2023-02-03 ENCOUNTER — Ambulatory Visit: Payer: Medicare Other | Admitting: Family Medicine

## 2023-02-03 ENCOUNTER — Encounter: Payer: Self-pay | Admitting: Family Medicine

## 2023-02-03 VITALS — BP 120/78 | HR 93 | Temp 98.2°F | Ht 67.0 in | Wt 178.2 lb

## 2023-02-03 DIAGNOSIS — N401 Enlarged prostate with lower urinary tract symptoms: Secondary | ICD-10-CM

## 2023-02-03 DIAGNOSIS — E7800 Pure hypercholesterolemia, unspecified: Secondary | ICD-10-CM

## 2023-02-03 DIAGNOSIS — Z131 Encounter for screening for diabetes mellitus: Secondary | ICD-10-CM

## 2023-02-03 DIAGNOSIS — R3911 Hesitancy of micturition: Secondary | ICD-10-CM | POA: Diagnosis not present

## 2023-02-03 DIAGNOSIS — I10 Essential (primary) hypertension: Secondary | ICD-10-CM | POA: Diagnosis not present

## 2023-02-03 LAB — BASIC METABOLIC PANEL
BUN: 13 mg/dL (ref 6–23)
CO2: 29 mEq/L (ref 19–32)
Calcium: 9.3 mg/dL (ref 8.4–10.5)
Chloride: 98 mEq/L (ref 96–112)
Creatinine, Ser: 0.85 mg/dL (ref 0.40–1.50)
GFR: 84.23 mL/min (ref >60.00)
Glucose, Bld: 117 mg/dL — ABNORMAL HIGH (ref 70–99)
Potassium: 5 mEq/L (ref 3.5–5.1)
Sodium: 133 mEq/L — ABNORMAL LOW (ref 135–145)

## 2023-02-03 LAB — LIPID PANEL
Cholesterol: 166 mg/dL (ref 0–200)
HDL: 76.1 mg/dL (ref >39.00)
LDL Cholesterol: 76 mg/dL (ref 0–99)
NonHDL: 90.33
Total CHOL/HDL Ratio: 2
Triglycerides: 74 mg/dL (ref 0.0–149.0)
VLDL: 14.8 mg/dL (ref 0.0–40.0)

## 2023-02-03 LAB — HEMOGLOBIN A1C: Hgb A1c MFr Bld: 5.8 % (ref 4.6–6.5)

## 2023-02-03 NOTE — Progress Notes (Signed)
Established Patient Office Visit   Subjective:  Patient ID: Jonathon Johnson, male    DOB: 03/27/1946  Age: 77 y.o. MRN: [id number]  Chief Complaint  Patient presents with   Medical Management of Chronic Issues    6 month follow up. Pt is not fasting.     HPI Encounter Diagnoses  Name Primary?   Essential hypertension Yes   Elevated LDL cholesterol level    Screening for diabetes mellitus    Benign prostatic hyperplasia with urinary hesitancy    For follow-up of above.  Blood pressure has been running lower.  He is less stressed.  Continues with the heart study and rosuvastatin 20 mg daily.  Continues finasteride and tamsulosin for BPH.  Is helping.  He is active physically.  Scheduled for cataract extractions in the near future.   Review of Systems  Constitutional: Negative.   HENT: Negative.    Eyes:  Negative for blurred vision, discharge and redness.  Respiratory: Negative.    Cardiovascular: Negative.   Gastrointestinal:  Negative for abdominal pain.  Genitourinary: Negative.   Musculoskeletal: Negative.  Negative for myalgias.  Skin:  Negative for rash.  Neurological:  Negative for tingling, loss of consciousness and weakness.  Endo/Heme/Allergies:  Negative for polydipsia.     Current Outpatient Medications:    amitriptyline (ELAVIL) 25 MG tablet, TAKE 1 TABLET AT BEDTIME, Disp: 90 tablet, Rfl: 3   aspirin EC 81 MG tablet, Take 81 mg by mouth daily. Swallow whole., Disp: , Rfl:    carvedilol (COREG) 12.5 MG tablet, TAKE 1 TABLET TWICE A DAY WITH MEALS, Disp: 180 tablet, Rfl: 3   finasteride (PROSCAR) 5 MG tablet, TAKE 1 TABLET DAILY, Disp: 90 tablet, Rfl: 3   fluticasone (FLONASE) 50 MCG/ACT nasal spray, USE 2 SPRAYS IN EACH NOSTRIL DAILY, Disp: 48 g, Rfl: 3   ibuprofen (ADVIL) 600 MG tablet, Take 600 mg by mouth every 6 (six) hours as needed., Disp: , Rfl:    methocarbamol (ROBAXIN) 500 MG tablet, Take by mouth every 6 (six) hours as needed., Disp: , Rfl:     Multiple Vitamin (MULTIVITAMIN) tablet, Take 1 tablet by mouth daily., Disp: , Rfl:    omeprazole (PRILOSEC) 40 MG capsule, TAKE 1 CAPSULE DAILY, Disp: 90 capsule, Rfl: 3   rosuvastatin (CRESTOR) 20 MG tablet, TAKE 1 TABLET DAILY, Disp: 90 tablet, Rfl: 3   tamsulosin (FLOMAX) 0.4 MG CAPS capsule, TAKE 2 CAPSULES DAILY AFTER BREAKFAST, Disp: 180 capsule, Rfl: 3   valsartan (DIOVAN) 160 MG tablet, TAKE 1 TABLET DAILY, Disp: 90 tablet, Rfl: 3   WIXELA INHUB 250-50 MCG/ACT AEPB, USE 1 INHALATION TWICE A DAY, Disp: 180 each, Rfl: 3   Study - VICTORION-1 PREVENT - inclisiran 300 mg/1.37mL or placebo SQ injection (PI-Stuckey), Inject 300 mg into the skin every 6 (six) months. For Investigational Use Only. Inject 1.5 mL subcutaneously into the abdomen, upper arm or thigh. Do not inject into areas of active skin disease, such as sunburns, skin rashes, inflammation, or skin infections. Administered on day 1, day 90, and every 6 months thereafter. Please contact [facility] for any questions or concerns regarding this medication. (Patient not taking: Reported on 02/03/2023), Disp: , Rfl:   Current Facility-Administered Medications:    Study - VICTORION-1 PREVENT - inclisiran 300 mg/1.40mL or placebo SQ injection (PI-Stuckey), 300 mg, Subcutaneous, Q6 months, Herby Abraham, MD   Objective:     BP 120/78   Pulse 93   Temp 98.2 F (36.8  C)   Ht 5\' 7"  (1.702 m)   Wt 178 lb 3.2 oz (80.8 kg)   SpO2 99%   BMI 27.91 kg/m    Physical Exam Constitutional:      General: He is not in acute distress.    Appearance: Normal appearance. He is not ill-appearing, toxic-appearing or diaphoretic.  HENT:     Head: Normocephalic and atraumatic.     Right Ear: External ear normal.     Left Ear: External ear normal.  Eyes:     General: No scleral icterus.       Right eye: No discharge.        Left eye: No discharge.     Extraocular Movements: Extraocular movements intact.      Conjunctiva/sclera: Conjunctivae normal.     Pupils: Pupils are equal, round, and reactive to light.  Cardiovascular:     Rate and Rhythm: Normal rate and regular rhythm.  Pulmonary:     Effort: Pulmonary effort is normal. No respiratory distress.     Breath sounds: No wheezing, rhonchi or rales.  Skin:    General: Skin is warm and dry.  Neurological:     Mental Status: He is alert and oriented to person, place, and time.  Psychiatric:        Mood and Affect: Mood normal.        Behavior: Behavior normal.      No results found for any visits on 02/03/23.    The 10-year ASCVD risk score (Arnett DK, et al., 2019) is: 22.9%    Assessment & Plan:   Essential hypertension -     Basic metabolic panel  Elevated LDL cholesterol level -     Lipid panel  Screening for diabetes mellitus -     Hemoglobin A1c  Benign prostatic hyperplasia with urinary hesitancy    Return in about 6 months (around 08/06/2023).  Continue all current medications.  Adjustments made pending results.  Mliss Sax, MD

## 2023-02-10 DIAGNOSIS — H25811 Combined forms of age-related cataract, right eye: Secondary | ICD-10-CM | POA: Diagnosis not present

## 2023-02-10 DIAGNOSIS — H2181 Floppy iris syndrome: Secondary | ICD-10-CM | POA: Diagnosis not present

## 2023-02-10 DIAGNOSIS — H2511 Age-related nuclear cataract, right eye: Secondary | ICD-10-CM | POA: Diagnosis not present

## 2023-02-16 DIAGNOSIS — H2512 Age-related nuclear cataract, left eye: Secondary | ICD-10-CM | POA: Diagnosis not present

## 2023-02-19 ENCOUNTER — Ambulatory Visit: Payer: Medicare Other | Attending: Cardiology | Admitting: Cardiology

## 2023-02-19 ENCOUNTER — Encounter: Payer: Self-pay | Admitting: Cardiology

## 2023-02-19 VITALS — BP 134/86 | HR 73 | Ht 66.0 in | Wt 180.2 lb

## 2023-02-19 DIAGNOSIS — I10 Essential (primary) hypertension: Secondary | ICD-10-CM

## 2023-02-19 DIAGNOSIS — I2510 Atherosclerotic heart disease of native coronary artery without angina pectoris: Secondary | ICD-10-CM

## 2023-02-19 DIAGNOSIS — E785 Hyperlipidemia, unspecified: Secondary | ICD-10-CM

## 2023-02-19 DIAGNOSIS — R002 Palpitations: Secondary | ICD-10-CM | POA: Diagnosis not present

## 2023-02-19 DIAGNOSIS — I2584 Coronary atherosclerosis due to calcified coronary lesion: Secondary | ICD-10-CM | POA: Diagnosis not present

## 2023-02-19 NOTE — Progress Notes (Signed)
Cardiology Office Note:    Date:  02/19/2023   ID:  [name], DOB Feb 06, 1946, MRN [id number]  PCP:  Mliss Sax, MD  Cardiologist:  Little Ishikawa, MD  Electrophysiologist:  None   Referring MD: Mliss Sax,*   Chief Complaint  Patient presents with   Coronary Artery Disease     History of Present Illness:    Jonathon Johnson is a 77 y.o. male with a hx of CAD, asthma, hypertension, hyperlipidemia, GERD who presents for follow-up. He was referred by Dr. Doreene Burke for evaluation of tachycardia, initially seen on 12/15/2019. He was seen in the ED on 12/07/2019 for tachycardia.  He reported having palpitations and feeling like his heart was racing.  Checked his pulse and it was in the 130s.  In the ED, pulse remained in 120s, sinus tachycardia.  Lab work showed normal electrolytes,TSH, and negative troponin x2.  Age-adjusted D-dimer was negative.  He was given IV fluids, p.o. Ativan and IV metoprolol.  Heart rate improved to 80s to 90s.  Echocardiogram on 01/04/2020 showed normal biventricular function, no significant valvular disease. Zio patch x7 days on 01/12/2020 showed 23 episodes of SVT, longest lasting 17 beats. Patient triggered events corresponded to sinus rhythm  PACs/PVCs.  Calcium score 1212 (81st percentile) on 05/17/2021.  Stress PET 09/24/2022 showed normal perfusion, mildly abnormal myocardial blood flow reserve (1.75; had normal stress flows but high resting flows).  Since last clinic visit, he reports he is doing well.  Denies any chest pain, lower extremity edema, or palpitations.  Does report some dyspnea with exertion, occurs with working outside.  He works out regularly on the elliptical, denies any dyspnea with this.  Reports occasional lightheadedness but no syncope.  Planning to start walking now that the weather has improved.  Continues to drink about 4 glasses of wine per night.   BP Readings from Last 3 Encounters:  02/19/23 134/86  02/03/23  120/78  09/24/22 (!) 148/63     Past Medical History:  Diagnosis Date   Asthma    Chicken pox    GERD (gastroesophageal reflux disease)    Hyperlipidemia 11/04/2016   Hypertension 07/19/2018    Past Surgical History:  Procedure Laterality Date   CHOLECYSTECTOMY     TRANSFORAMINAL LUMBAR INTERBODY FUSION (TLIF) WITH PEDICLE SCREW FIXATION 1 LEVEL Right 09/25/2021   Procedure: RIGHT-SIDED TRANSFORAMINAL LUMBAR INTERBODY FUSION AND DECOMPRESSION LUMBAR 4-  LUMBAR 5 WITH INSTRUMENTATION AND ALLOGRAFT;  Surgeon: Estill Bamberg, MD;  Location: [facility] OR;  Service: Orthopedics;  Laterality: Right;    Current Medications: Current Meds  Medication Sig   amitriptyline (ELAVIL) 25 MG tablet Take 25 mg by mouth at bedtime.   aspirin EC 81 MG tablet Take 81 mg by mouth daily. Swallow whole.   atropine 1 % ophthalmic solution Place 1 drop into the right eye daily.   carvedilol (COREG) 12.5 MG tablet TAKE 1 TABLET TWICE A DAY WITH MEALS   finasteride (PROSCAR) 5 MG tablet TAKE 1 TABLET DAILY   fluticasone (FLONASE) 50 MCG/ACT nasal spray USE 2 SPRAYS IN EACH NOSTRIL DAILY   ibuprofen (ADVIL) 600 MG tablet Take 600 mg by mouth every 6 (six) hours as needed.   methocarbamol (ROBAXIN) 500 MG tablet Take by mouth every 6 (six) hours as needed.   Multiple Vitamin (MULTIVITAMIN) tablet Take 1 tablet by mouth daily.   omeprazole (PRILOSEC) 40 MG capsule TAKE 1 CAPSULE DAILY   rosuvastatin (CRESTOR) 20 MG tablet TAKE 1 TABLET DAILY  Study - VICTORION-1 PREVENT - inclisiran 300 mg/1.23mL or placebo SQ injection (PI-Stuckey) Inject 300 mg into the skin every 6 (six) months. For Investigational Use Only. Inject 1.5 mL subcutaneously into the abdomen, upper arm or thigh. Do not inject into areas of active skin disease, such as sunburns, skin rashes, inflammation, or skin infections. Administered on day 1, day 90, and every 6 months thereafter. Please contact [facility] for any  questions or concerns regarding this medication.   tamsulosin (FLOMAX) 0.4 MG CAPS capsule TAKE 2 CAPSULES DAILY AFTER BREAKFAST   valsartan (DIOVAN) 160 MG tablet TAKE 1 TABLET DAILY   WIXELA INHUB 250-50 MCG/ACT AEPB USE 1 INHALATION TWICE A DAY   Current Facility-Administered Medications for the 02/19/23 encounter (Office Visit) with Little Ishikawa, MD  Medication   Study - VICTORION-1 PREVENT - inclisiran 300 mg/1.36mL or placebo SQ injection (PI-Stuckey)     Allergies:   Amoxicillin and Morphine and codeine   Social History   Socioeconomic History   Marital status: Married    Spouse name: Not on file   Number of children: Not on file   Years of education: Not on file   Highest education level: Not on file  Occupational History   Not on file  Tobacco Use   Smoking status: Former    Current packs/day: 0.00    Types: Cigarettes    Quit date: 07/19/2010    Years since quitting: 12.5   Smokeless tobacco: Never  Vaping Use   Vaping status: Never Used  Substance and Sexual Activity   Alcohol use: Yes    Alcohol/week: 14.0 standard drinks of alcohol    Types: 14 Glasses of wine per week    Comment: 3-4 glasses of wine daily   Drug use: Never   Sexual activity: Yes    Partners: Female  Other Topics Concern   Not on file  Social History Narrative   Right handed   Social Determinants of Health   Financial Resource Strain: Low Risk  (07/28/2022)   Overall Financial Resource Strain (CARDIA)    Difficulty of Paying Living Expenses: Not hard at all  Food Insecurity: No Food Insecurity (07/28/2022)   Hunger Vital Sign    Worried About Running Out of Food in the Last Year: Never true    Ran Out of Food in the Last Year: Never true  Transportation Needs: No Transportation Needs (07/28/2022)   PRAPARE - Administrator, Civil Service (Medical): No    Lack of Transportation (Non-Medical): No  Physical Activity: Sufficiently Active (07/28/2022)   Exercise Vital  Sign    Days of Exercise per Week: 3 days    Minutes of Exercise per Session: 50 min  Stress: No Stress Concern Present (07/28/2022)   [name] Stress Questionnaire    Feeling of Stress : Not at all  Social Connections: Moderately Integrated (07/24/2021)   Social Connection and Isolation Panel [NHANES]    Frequency of Communication with Friends and Family: More than three times a week    Frequency of Social Gatherings with Friends and Family: Once a week    Attends Religious Services: 1 to 4 times per year    Active Member of [employer] or Organizations: No    Attends Banker Meetings: Never    Marital Status: Married     Family History: The patient's family history includes Alcohol abuse in his father and mother; COPD in his father; Cancer  in his sister; Depression in his mother; Diabetes in his mother; Hypertension in his brother; Migraines in his daughter; Multiple sclerosis in his daughter.  ROS:   Please see the history of present illness.     All other systems reviewed and are negative.  EKGs/Labs/Other Studies Reviewed:    The following studies were reviewed today:   EKG:   04/18/21: Normal sinus rhythm, rate 87, no ST abnormality 08/19/2022: Sinus rhythm with PACs, low voltage, rate 79 02/19/2023: Sinus rhythm with PACs, left axis deviation, rate 73    Recent Labs: 08/08/2022: ALT 31; Hemoglobin 14.3; Platelets 285.0 02/03/2023: BUN 13; Creatinine, Ser 0.85; Potassium 5.0; Sodium 133  Recent Lipid Panel    Component Value Date/Time   CHOL 166 02/03/2023 0846   TRIG 74.0 02/03/2023 0846   HDL 76.10 02/03/2023 0846   CHOLHDL 2 02/03/2023 0846   VLDL 14.8 02/03/2023 0846   LDLCALC 76 02/03/2023 0846   LDLDIRECT 101.0 08/01/2021 0923    Physical Exam:    VS:  BP 134/86   Pulse 73   Ht 5\' 6"  (1.676 m)   Wt 180 lb 3.2 oz (81.7 kg)   SpO2 99%   BMI 29.09 kg/m     Wt Readings from Last 3 Encounters:  02/19/23  180 lb 3.2 oz (81.7 kg)  02/03/23 178 lb 3.2 oz (80.8 kg)  09/04/22 182 lb (82.6 kg)     GEN:  in no acute distress HEENT: Normal NECK: No JVD; No carotid bruits CARDIAC: RRR, no murmurs, rubs, gallops RESPIRATORY:  Clear to auscultation without rales, wheezing or rhonchi  ABDOMEN: Soft, non-tender, non-distended MUSCULOSKELETAL:  No edema; No deformity  SKIN: Warm and dry NEUROLOGIC:  Alert and oriented x 3 PSYCHIATRIC:  Normal affect   ASSESSMENT:    1. Coronary artery calcification   2. Palpitations   3. Essential hypertension   4. Hyperlipidemia, unspecified hyperlipidemia type      PLAN:    CAD: Calcium score 1212 (81st percentile) on 05/17/2021.  Denies any chest pain but reports dyspnea on exertion.  Stress PET 09/24/2022 showed normal perfusion, mildly abnormal myocardial blood flow reserve (1.75; had normal stress flows but high resting flows). -Continue aspirin 81 mg daily -Participating in [place] study, on inclisiran versus placebo, in addition to rosuvastatin 20 mg daily  Tachycardia/palpitations/lightheadedness: Description concerning for arrhythmia.  Echocardiogram on 01/04/2020 showed normal biventricular function, no significant valvular disease. Zio patch x7 days on 01/12/2020 showed 23 episodes of SVT, longest lasting 17 beats. Patient triggered events corresponded to sinus rhythm  PACs/PVCs.  Suspect alcohol use (4 to 5 glasses of wine per night) could be contributing, recommended cutting back on alcohol intake.  He denies any history of withdrawal symptoms.  Continue carvedilol 12.5 mg twice daily.  Reports palpitations have improved.  Hypertension: On losartan 100 mg daily and carvedilol 12.5 mg twice daily.  Appears controlled  Hyperlipidemia: 10-year ASCVD risk score 21%. LDL 108 on 11/29/2019.  Started rosuvastatin 10 mg daily, LDL 91 on 09/28/2020.  Calcium score 1212 (81st percentile) on 05/17/2021.  Participating in [place] study, on inclisiran versus  placebo, in addition to rosuvastatin 20 mg daily.  LDL 76 on 02/03/2023  RTC in 6 months    Medication Adjustments/Labs and Tests Ordered: Current medicines are reviewed at length with the patient today.  Concerns regarding medicines are outlined above.  Orders Placed This Encounter  Procedures   EKG 12-Lead    No orders of the defined types were placed in this  encounter.    Patient Instructions  Medication Instructions:  Your physician recommends that you continue on your current medications as directed. Please refer to the Current Medication list given to you today.  *If you need a refill on your cardiac medications before your next appointment, please call your pharmacy*  Lab Work: None ordered If you have labs (blood work) drawn today and your tests are completely normal, you will receive your results only by: MyChart Message (if you have MyChart) OR A paper copy in the mail If you have any lab test that is abnormal or we need to change your treatment, we will call you to review the results.  Follow-Up: At [facility], you and your health needs are our priority.  As part of our continuing mission to provide you with exceptional heart care, we have created designated Provider Care Teams.  These Care Teams include your primary Cardiologist (physician) and Advanced Practice Providers (APPs -  Physician Assistants and Nurse Practitioners) who all work together to provide you with the care you need, when you need it.  Your next appointment:   6 month(s)  Provider:   Little Ishikawa, MD       Signed, Little Ishikawa, MD  02/19/2023 10:55 AM    [facility]

## 2023-02-19 NOTE — Patient Instructions (Signed)
Medication Instructions:  Your physician recommends that you continue on your current medications as directed. Please refer to the Current Medication list given to you today.  *If you need a refill on your cardiac medications before your next appointment, please call your pharmacy*  Lab Work: None ordered If you have labs (blood work) drawn today and your tests are completely normal, you will receive your results only by: MyChart Message (if you have MyChart) OR A paper copy in the mail If you have any lab test that is abnormal or we need to change your treatment, we will call you to review the results.  Follow-Up: At [facility], you and your health needs are our priority.  As part of our continuing mission to provide you with exceptional heart care, we have created designated Provider Care Teams.  These Care Teams include your primary Cardiologist (physician) and Advanced Practice Providers (APPs -  Physician Assistants and Nurse Practitioners) who all work together to provide you with the care you need, when you need it.  Your next appointment:   6 month(s)  Provider:   Little Ishikawa, MD

## 2023-02-22 IMAGING — MR MR LUMBAR SPINE W/O CM
4 of 5 series · 20 of 48 positions shown · non-contrast
Comparison: Lumbar MRI 09/01/2020.  Lumbar radiographs 08/28/2020.

CLINICAL DATA: 75-year-old male with low back pain. History of
L4-L5 spinal stenosis.

EXAM:
MRI LUMBAR SPINE WITHOUT CONTRAST
TECHNIQUE: Multiplanar, multisequence MR imaging of the lumbar spine was
performed. No intravenous contrast was administered.

[Series 5: T2 · sagittal · 4.0mm · 0.73mm/px · 6 of 16 slices shown (1 of 2)]
[im 1/16]
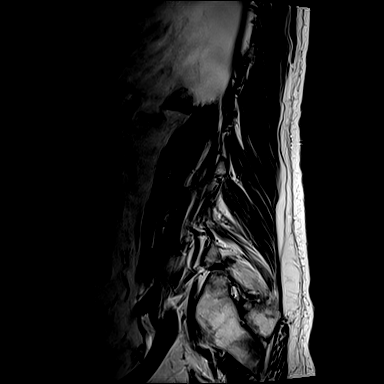
[im 4/16]
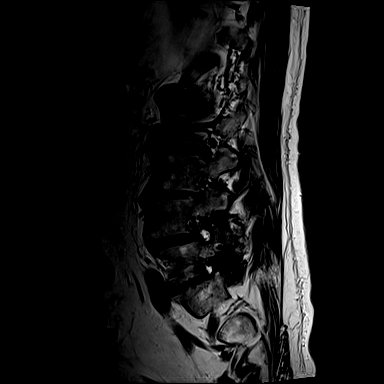
[im 7/16]
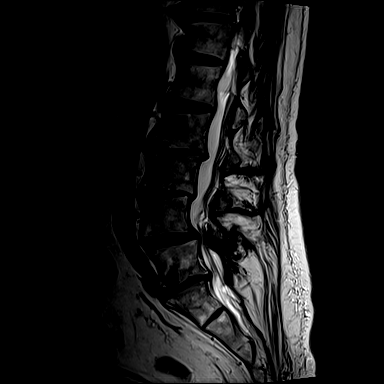
[im 10/16]
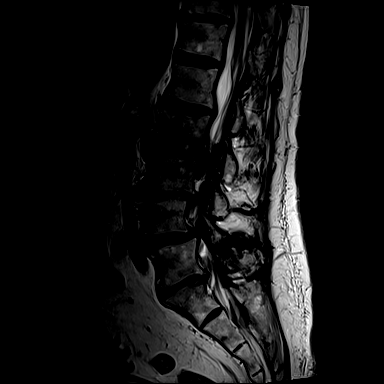
[im 13/16]
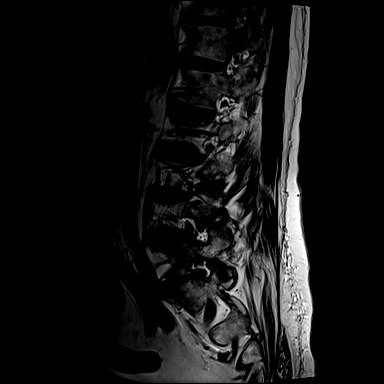
[im 16/16]
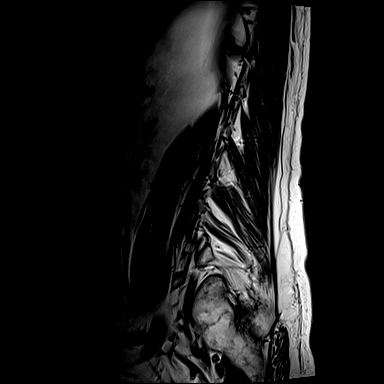

[Series 6: T1 · sagittal · 4.0mm · 0.73mm/px · 3 of 16 slices shown (1 of 2)]
[im 3/16]
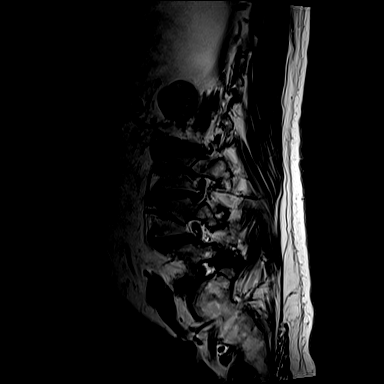
[im 8/16]
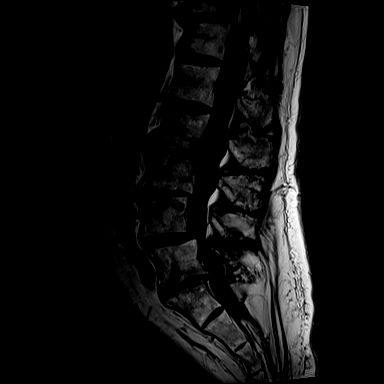
[im 13/16]
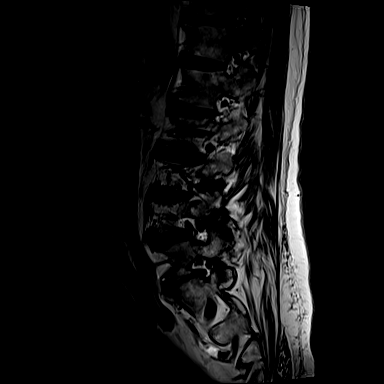

[Series 8: T2 · axial · 4.0mm · 0.28mm/px · z∈[-50,+121]mm · 8 of 32 slices shown (2 of 2)]
[im 1/32]
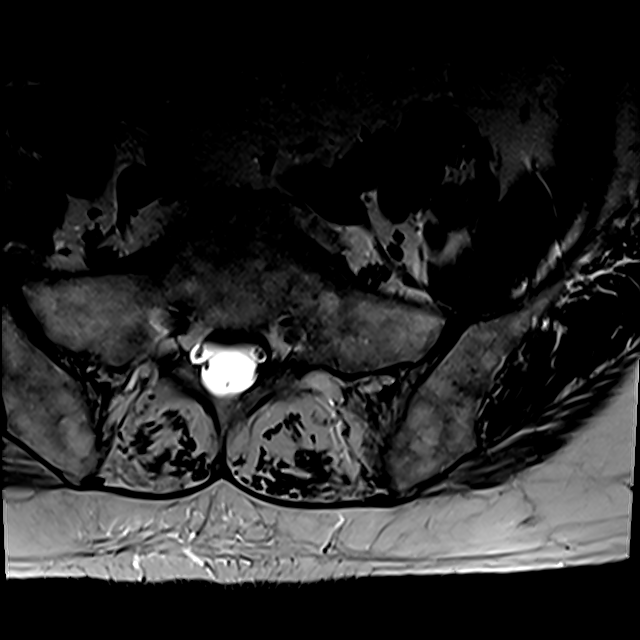
[im 5/32]
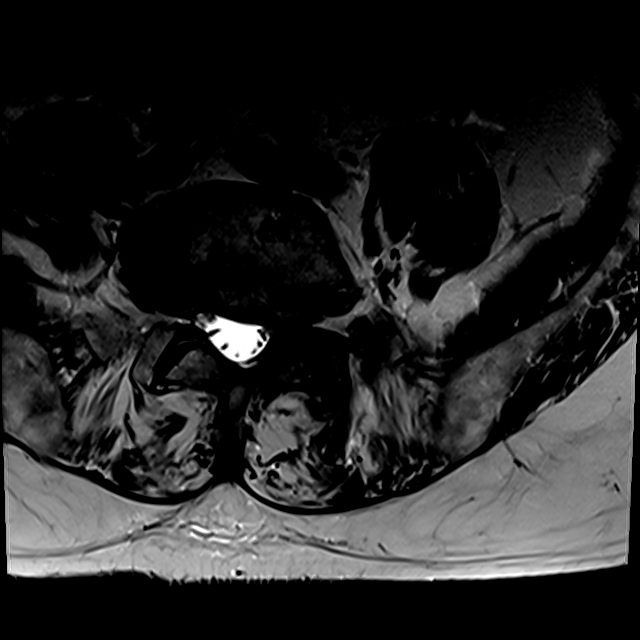
[im 10/32]
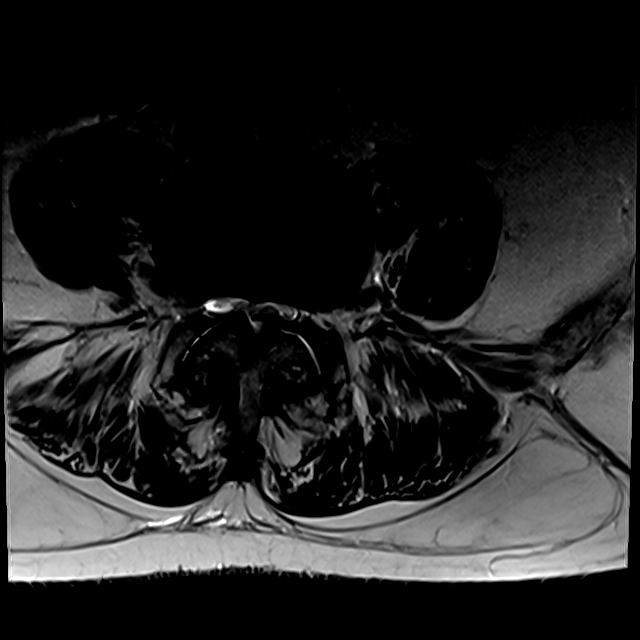
[im 15/32]
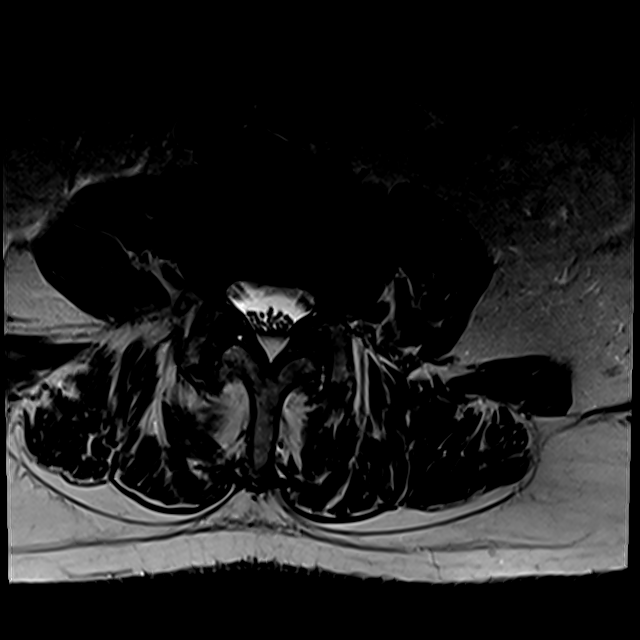
[im 17/32]
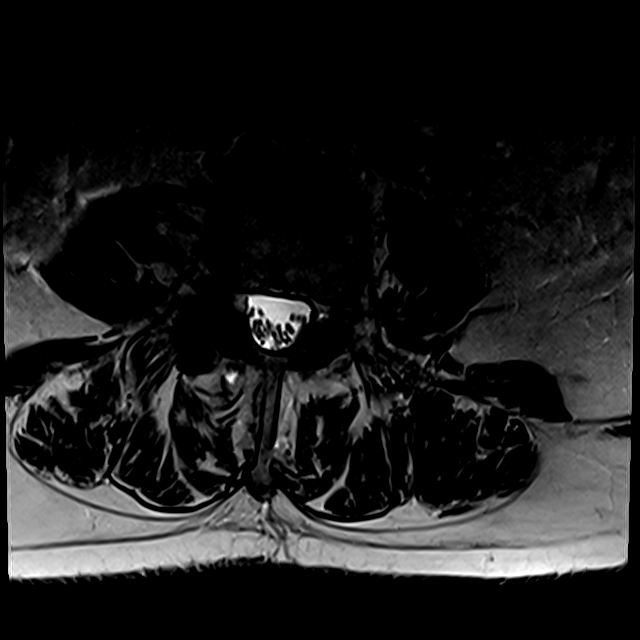
[im 22/32]
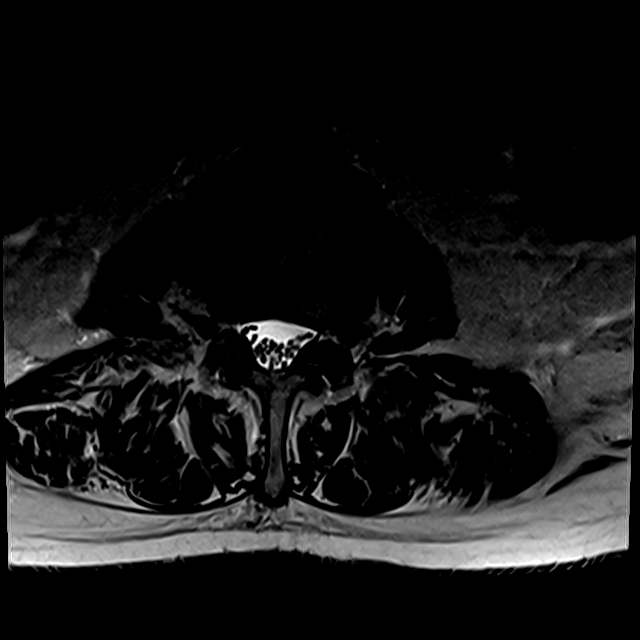
[im 27/32]
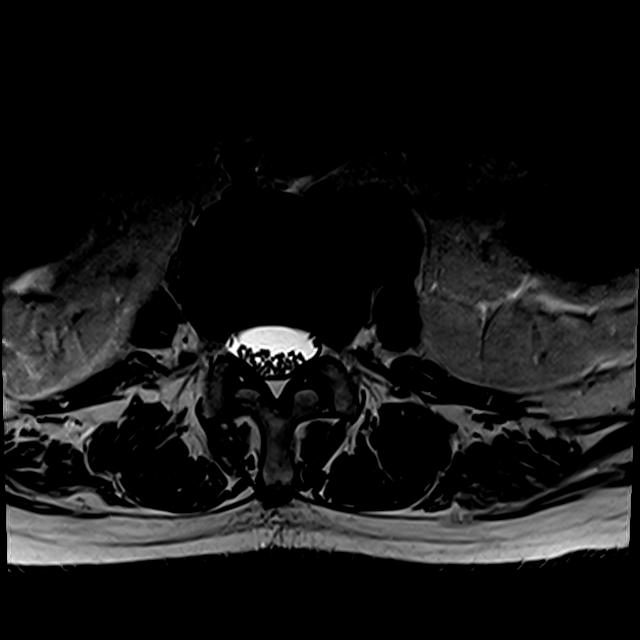
[im 32/32]
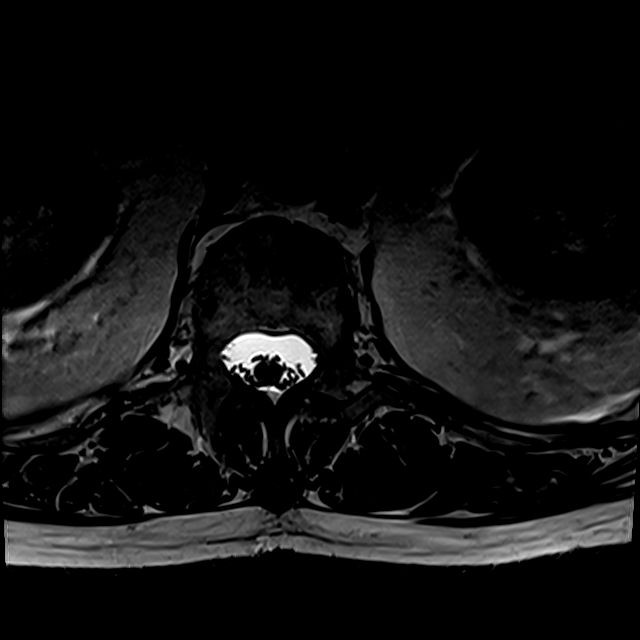

[Series 9: T1 · axial · 4.0mm · 0.56mm/px · z∈[-31,+96]mm · 3 of 32 slices shown (2 of 2)]
[im 5/32]
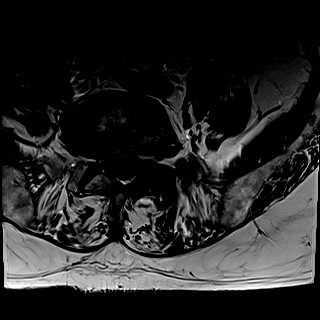
[im 17/32]
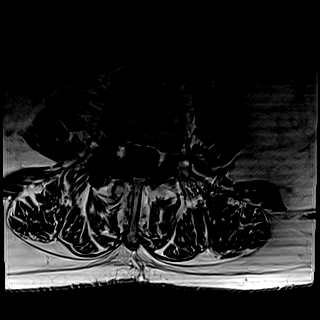
[im 27/32]
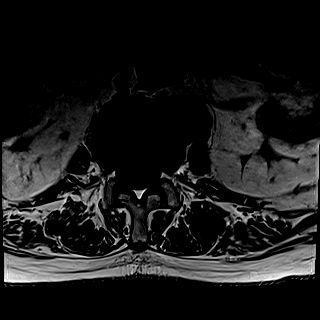

[20 of 48 positions shown; findings below may reference images not displayed]

FINDINGS: Segmentation: Normal on the prior radiographs which is the same
numbering system used on the MRI last year.

Alignment: Stable. Mild levoconvex lumbar scoliosis. Grade 1
anterolisthesis of L4 on L5 measures 4-5 mm. Mild chronic
retrolisthesis at L2-L3, L3-L4, and L5-S1.

Vertebrae: Bulky left anterolateral endplate osteophyte at L1-L2.
Faint degenerative endplate marrow edema at L2-L3 and L5-S1. Chronic
degenerative appearing anterior L5 vertebral body sclerosis. Normal
background bone marrow signal. Intact visible sacrum.

Conus medullaris and cauda equina: Conus extends to the L1 level. No
lower spinal cord or conus signal abnormality.

Paraspinal and other soft tissues: Diverticulosis of large bowel at
the pelvic inlet. Otherwise negative.

Disc levels:

T12-L1 through L1-L2 levels are stable with up to mild disc bulging
but no significant stenosis.

L2-L3: Pronounced chronic disc space loss with circumferential disc
osteophyte complex. No spinal or convincing lateral recess stenosis.
Mild to moderate bilateral L2 foraminal stenosis is stable and
greater on the right.

L3-L4: Chronic disc space loss with circumferential disc osteophyte
complex and mild to moderate facet and ligament flavum hypertrophy.
No significant spinal or lateral recess stenosis. Moderate to severe
bilateral L3 foraminal stenosis appears stable and greater on the
right.

L4-L5: Chronic grade 1 anterolisthesis with circumferential
disc/pseudo disc and severe facet hypertrophy. Degenerative facet
joint fluid has increased. Moderate ligament flavum hypertrophy.
Severe chronic spinal stenosis with symmetric lateral recess
involvement (series 5, image 9 and series 8, image 23). Mild to
moderate bilateral L4 foraminal stenosis. This level has not
significantly changed.

L5-S1: Chronic disc space loss and circumferential disc osteophyte
complex with moderate facet hypertrophy greater on the left. No
spinal stenosis. Mild chronic left lateral recess stenosis (left S1
nerve level). Moderate left and mild to moderate right L5 foraminal
stenosis. This level is stable.
IMPRESSION: 1. Stable MRI appearance of the lumbar spine since last year.
2. Chronic grade 1 anterolisthesis at L4-L5 with multifactorial
severe spinal and lateral recess stenosis, mild to moderate
foraminal stenosis.
3. Mild chronic retrolisthesis at L2-L3, L3-L4, and L5-S1. Up to
moderate bilateral L2, L5, and moderate to severe bilateral L3
neural foraminal stenosis.

## 2023-03-03 DIAGNOSIS — H2512 Age-related nuclear cataract, left eye: Secondary | ICD-10-CM | POA: Diagnosis not present

## 2023-03-06 ENCOUNTER — Other Ambulatory Visit: Payer: Self-pay

## 2023-03-06 ENCOUNTER — Encounter: Payer: Medicare Other | Admitting: *Deleted

## 2023-03-06 DIAGNOSIS — Z006 Encounter for examination for normal comparison and control in clinical research program: Secondary | ICD-10-CM

## 2023-03-06 MED ORDER — STUDY - VICTORION-1 PREVENT - INCLISIRAN 300 MG/1.5 ML OR PLACEBO SQ INJECTION (PI-STUCKEY)
300.0000 mg | INJECTION | SUBCUTANEOUS | Status: DC
  Administered 2023-03-06: 300 mg via SUBCUTANEOUS
  Filled 2023-03-06: qty 1.5

## 2023-03-06 NOTE — Research (Cosign Needed)
DATE: 27-Sept-2024  SUBJECT ID: [id number]   Visit: 4         Prior or concomitant medications reviewed [x]   Lipid lowering medications reviewed [x]   Surgical history reviewed [x]   TIME: BP: PULSE:  0831 143/93 68  0834 152/104 70  0836 138/77 67  Mean BP: 144/91 Mean Pulse: 68  Labs collected at: 0856  Fasting Lipid Profile [x]  Fasting Lp(a) [x]  Liver function tests []  Exploratory biomarkers []   AE/SAE assessment completed [x]   Study drug administered [x]   Endpoint assessment completed [x]   Mr Steidle is here for Visit 4 of V1P. He reports no abd pain, no visits to the ED or urgent care, and no medication changes since last seen. Injection given in left lower abd at 0858 Kit number [id number]. Tol well. Scheduled next visit for March 26 at 0830.    Current Outpatient Medications:    amitriptyline (ELAVIL) 25 MG tablet, Take 25 mg by mouth at bedtime., Disp: , Rfl:    aspirin EC 81 MG tablet, Take 81 mg by mouth daily. Swallow whole., Disp: , Rfl:    carvedilol (COREG) 12.5 MG tablet, TAKE 1 TABLET TWICE A DAY WITH MEALS, Disp: 180 tablet, Rfl: 3   finasteride (PROSCAR) 5 MG tablet, TAKE 1 TABLET DAILY, Disp: 90 tablet, Rfl: 3   fluticasone (FLONASE) 50 MCG/ACT nasal spray, USE 2 SPRAYS IN EACH NOSTRIL DAILY, Disp: 48 g, Rfl: 3   ibuprofen (ADVIL) 600 MG tablet, Take 600 mg by mouth every 6 (six) hours as needed., Disp: , Rfl:    methocarbamol (ROBAXIN) 500 MG tablet, Take by mouth every 6 (six) hours as needed., Disp: , Rfl:    Multiple Vitamin (MULTIVITAMIN) tablet, Take 1 tablet by mouth daily., Disp: , Rfl:    omeprazole (PRILOSEC) 40 MG capsule, TAKE 1 CAPSULE DAILY, Disp: 90 capsule, Rfl: 3   rosuvastatin (CRESTOR) 20 MG tablet, TAKE 1 TABLET DAILY, Disp: 90 tablet, Rfl: 3   Study - VICTORION-1 PREVENT - inclisiran 300 mg/1.38mL or placebo SQ injection (PI-Stuckey), Inject 300 mg into the skin every 6 (six) months. For Investigational Use Only. Inject 1.5 mL subcutaneously  into the abdomen, upper arm or thigh. Do not inject into areas of active skin disease, such as sunburns, skin rashes, inflammation, or skin infections. Administered on day 1, day 90, and every 6 months thereafter. Please contact [facility] for any questions or concerns regarding this medication., Disp: , Rfl:    tamsulosin (FLOMAX) 0.4 MG CAPS capsule, TAKE 2 CAPSULES DAILY AFTER BREAKFAST, Disp: 180 capsule, Rfl: 3   valsartan (DIOVAN) 160 MG tablet, TAKE 1 TABLET DAILY, Disp: 90 tablet, Rfl: 3   WIXELA INHUB 250-50 MCG/ACT AEPB, USE 1 INHALATION TWICE A DAY, Disp: 180 each, Rfl: 3   atropine 1 % ophthalmic solution, Place 1 drop into the right eye daily. (Patient not taking: Reported on 03/06/2023), Disp: , Rfl:   Current Facility-Administered Medications:    Study - VICTORION-1 PREVENT - inclisiran 300 mg/1.42mL or placebo SQ injection (PI-Stuckey), 300 mg, Subcutaneous, Q6 months, , 300 mg at 03/06/23 4742

## 2023-04-29 DIAGNOSIS — R3912 Poor urinary stream: Secondary | ICD-10-CM | POA: Diagnosis not present

## 2023-04-29 DIAGNOSIS — N401 Enlarged prostate with lower urinary tract symptoms: Secondary | ICD-10-CM | POA: Diagnosis not present

## 2023-04-30 ENCOUNTER — Encounter: Payer: Self-pay | Admitting: Family Medicine

## 2023-04-30 NOTE — Telephone Encounter (Signed)
Does he need an appointment with you or can yo just order labs and have him com in for a lab appt?

## 2023-06-09 ENCOUNTER — Other Ambulatory Visit: Payer: Self-pay | Admitting: Cardiology

## 2023-06-09 ENCOUNTER — Other Ambulatory Visit: Payer: Self-pay | Admitting: Family Medicine

## 2023-06-09 DIAGNOSIS — K219 Gastro-esophageal reflux disease without esophagitis: Secondary | ICD-10-CM

## 2023-06-09 DIAGNOSIS — R059 Cough, unspecified: Secondary | ICD-10-CM

## 2023-06-11 ENCOUNTER — Encounter: Payer: Self-pay | Admitting: Family Medicine

## 2023-06-11 MED ORDER — FLUTICASONE PROPIONATE 50 MCG/ACT NA SUSP: 2.0000 | Freq: Every day | NASAL | 3 refills | Status: AC

## 2023-06-11 NOTE — Telephone Encounter (Signed)
 LR   LOV   02/03/23 FOV   08/06/23     Please review and advise.   Thanks. Dm/cma

## 2023-06-30 ENCOUNTER — Ambulatory Visit
Admission: RE | Admit: 2023-06-30 | Discharge: 2023-06-30 | Disposition: A | Discharge status: Home / Self Care | Payer: Medicare Other | Source: Ambulatory Visit | Attending: Emergency Medicine | Admitting: Emergency Medicine

## 2023-06-30 VITALS — BP 153/89 | HR 78 | Temp 97.6°F | Resp 17

## 2023-06-30 DIAGNOSIS — B349 Viral infection, unspecified: Secondary | ICD-10-CM

## 2023-06-30 MED ORDER — ALBUTEROL SULFATE HFA 108 (90 BASE) MCG/ACT IN AERS: 2.0000 | INHALATION_SPRAY | Freq: Four times a day (QID) | RESPIRATORY_TRACT | 1 refills | Status: AC | PRN

## 2023-06-30 MED ORDER — ALBUTEROL SULFATE HFA 108 (90 BASE) MCG/ACT IN AERS
2.0000 | INHALATION_SPRAY | Freq: Once | RESPIRATORY_TRACT | Status: AC
  Administered 2023-06-30: 2 via RESPIRATORY_TRACT

## 2023-06-30 MED ORDER — BENZONATATE 100 MG PO CAPS: 100.0000 mg | ORAL_CAPSULE | Freq: Three times a day (TID) | ORAL | 0 refills | Status: DC | PRN

## 2023-06-30 NOTE — ED Triage Notes (Signed)
Pt presents with a cough that's been going on for about 1 wk. States the cough is frequent and somewhat productive. C/o nasal congestion and when blowing his now he has has streaks of blood in mucus. Has difficulty sleeping.

## 2023-06-30 NOTE — ED Provider Notes (Signed)
[facility]    CSN: [id number] Arrival date & time: 06/30/23  1549      History   Chief Complaint Chief Complaint  Patient presents with   Nasal Congestion    HPI Nikai Schreyer is a 78 y.o. male.  Here with 4-5 day history of dry cough and nasal congestion At first was having rib and chest pain with cough, but that resolved.  Cough mostly dry, occasional mucous production. Has not had fever or chills. Had used tylenol only until today started mucinex No known sick contacts, no recent travel Denies history of lung problems  Not having shortness of breath or wheezing   Past Medical History:  Diagnosis Date   Asthma    Chicken pox    GERD (gastroesophageal reflux disease)    Hyperlipidemia 11/04/2016   Hypertension 07/19/2018    Patient Active Problem List   Diagnosis Date Noted   Radiculopathy 09/25/2021   Post herpetic neuralgia 11/08/2020   Chronic bilateral low back pain with bilateral sciatica 08/28/2020   Alcohol use 03/16/2020   Thumb pain, right 06/28/2019   Arthralgia of right temporomandibular joint 12/20/2018   Diarrhea 12/20/2018   Screening for diabetes mellitus 07/19/2018   Insomnia 07/19/2018   Gastroesophageal reflux disease 07/19/2018   Benign prostatic hyperplasia with urinary hesitancy 07/19/2018   Essential hypertension 07/19/2018   Elevated LDL cholesterol level 07/19/2018   Cough 07/19/2018   Mild reactive airways disease 07/19/2018    Past Surgical History:  Procedure Laterality Date   CHOLECYSTECTOMY     TRANSFORAMINAL LUMBAR INTERBODY FUSION (TLIF) WITH PEDICLE SCREW FIXATION 1 LEVEL Right 09/25/2021   Procedure: RIGHT-SIDED TRANSFORAMINAL LUMBAR INTERBODY FUSION AND DECOMPRESSION LUMBAR 4-  LUMBAR 5 WITH INSTRUMENTATION AND ALLOGRAFT;  Surgeon: Estill Bamberg, MD;  Location: [facility] OR;  Service: Orthopedics;  Laterality: Right;       Home Medications    Prior to Admission medications   Medication Sig Start Date End Date  Taking? Authorizing Provider  albuterol (VENTOLIN HFA) 108 (90 Base) MCG/ACT inhaler Inhale 2 puffs into the lungs every 6 (six) hours as needed for wheezing or shortness of breath. 06/30/23  Yes Alece Koppel, Lurena Joiner, PA-C  benzonatate (TESSALON) 100 MG capsule Take 1 capsule (100 mg total) by mouth 3 (three) times daily as needed for cough. 06/30/23  Yes Jalyric Kaestner, Lurena Joiner, PA-C  amitriptyline (ELAVIL) 25 MG tablet Take 25 mg by mouth at bedtime. 10/29/22   [provider]  aspirin EC 81 MG tablet Take 81 mg by mouth daily. Swallow whole.    [provider]  atropine 1 % ophthalmic solution Place 1 drop into the right eye daily. Patient not taking: Reported on 03/06/2023 12/31/22   [provider]  carvedilol (COREG) 12.5 MG tablet TAKE 1 TABLET TWICE A DAY WITH MEALS 10/02/22   Little Ishikawa, MD  finasteride (PROSCAR) 5 MG tablet TAKE 1 TABLET DAILY 02/02/23   Mliss Sax, MD  fluticasone [facility]) 50 MCG/ACT nasal spray Place 2 sprays into both nostrils daily. 06/11/23   Mliss Sax, MD  ibuprofen (ADVIL) 600 MG tablet Take 600 mg by mouth every 6 (six) hours as needed.    [provider]  methocarbamol (ROBAXIN) 500 MG tablet Take by mouth every 6 (six) hours as needed. 03/25/22   [provider]  Multiple Vitamin (MULTIVITAMIN) tablet Take 1 tablet by mouth daily.    [provider]  omeprazole (PRILOSEC) 40 MG capsule TAKE 1 CAPSULE DAILY 06/09/23   Doreene Burke,  Talmadge Coventry, MD  rosuvastatin (CRESTOR) 20 MG tablet TAKE 1 TABLET DAILY 10/02/22   Little Ishikawa, MD  Study - VICTORION-1 PREVENT - inclisiran 300 mg/1.44mL or placebo SQ injection (PI-Stuckey) Inject 300 mg into the skin every 6 (six) months. For Investigational Use Only. Inject 1.5 mL subcutaneously into the abdomen, upper arm or thigh. Do not inject into areas of active skin disease, such as sunburns, skin rashes, inflammation, or skin infections. Administered  on day 1, day 90, and every 6 months thereafter. Please contact [facility] for any questions or concerns regarding this medication. 12/12/21   Herby Abraham, MD  tamsulosin (FLOMAX) 0.4 MG CAPS capsule TAKE 2 CAPSULES DAILY AFTER BREAKFAST 07/12/20   Worthy Rancher B, FNP  valsartan (DIOVAN) 160 MG tablet TAKE 1 TABLET DAILY 06/09/23   Little Ishikawa, MD  [facility] INHUB 250-50 MCG/ACT AEPB USE 1 INHALATION TWICE A DAY 01/19/23   Mliss Sax, MD    Family History Family History  Problem Relation Age of Onset   Alcohol abuse Mother    Depression Mother    Diabetes Mother    Alcohol abuse Father    COPD Father    Cancer Sister    Hypertension Brother    Migraines Daughter    Multiple sclerosis Daughter     Social History Social History   Tobacco Use   Smoking status: Former    Current packs/day: 0.00    Types: Cigarettes    Quit date: 07/19/2010    Years since quitting: 12.9   Smokeless tobacco: Never  Vaping Use   Vaping status: Never Used  Substance Use Topics   Alcohol use: Yes    Alcohol/week: 14.0 standard drinks of alcohol    Types: 14 Glasses of wine per week    Comment: 3-4 glasses of wine daily   Drug use: Never     Allergies   Amoxicillin and Morphine and codeine   Review of Systems Review of Systems Per HPI  Physical Exam Triage Vital Signs ED Triage Vitals  Encounter Vitals Group     BP 06/30/23 1613 (!) 153/89     Systolic BP Percentile --      Diastolic BP Percentile --      Pulse Rate 06/30/23 1613 78     Resp 06/30/23 1613 17     Temp 06/30/23 1613 97.6 F (36.4 C)     Temp Source 06/30/23 1613 Oral     SpO2 06/30/23 1613 93 %     Weight --      Height --      Head Circumference --      Peak Flow --      Pain Score 06/30/23 1612 0     Pain Loc --      Pain Education --      Exclude from Growth Chart --    No data found.  Updated Vital Signs BP (!) 153/89 (BP Location: Right Arm)    Pulse 78   Temp 97.6 F (36.4 C) (Oral)   Resp 17   SpO2 93%    Cold fingers. Recheck O2 is 96-97%  Physical Exam Vitals and nursing note reviewed.  Constitutional:      General: He is not in acute distress. HENT:     Right Ear: Tympanic membrane and ear canal normal.     Left Ear: Tympanic membrane and ear canal normal.     Nose: No rhinorrhea.  Mouth/Throat:     Mouth: Mucous membranes are moist.     Pharynx: Oropharynx is clear. No posterior oropharyngeal erythema.  Eyes:     Conjunctiva/sclera: Conjunctivae normal.  Cardiovascular:     Rate and Rhythm: Normal rate and regular rhythm.     Pulses: Normal pulses.     Heart sounds: Normal heart sounds.  Pulmonary:     Effort: Pulmonary effort is normal. No respiratory distress.     Breath sounds: Normal breath sounds. No wheezing or rales.  Musculoskeletal:     Cervical back: Normal range of motion.  Lymphadenopathy:     Cervical: No cervical adenopathy.  Skin:    General: Skin is warm and dry.  Neurological:     Mental Status: He is alert and oriented to person, place, and time.     UC Treatments / Results  Labs (all labs ordered are listed, but only abnormal results are displayed) Labs Reviewed - No data to display  EKG   Radiology No results found.  Procedures Procedures (including critical care time)  Medications Ordered in UC Medications  albuterol (VENTOLIN HFA) 108 (90 Base) MCG/ACT inhaler 2 puff (2 puffs Inhalation Given 06/30/23 1702)    Initial Impression / Assessment and Plan / UC Course  I have reviewed the triage vital signs and the nursing notes.  Pertinent labs & imaging results that were available during my care of the patient were reviewed by me and considered in my medical decision making (see chart for details).  Afebrile, well appearing, clear lungs on exam Sating 96% on room air With 4-5 day history I suspect viral etiology. No indication for testing at this time. Recommend to  continue the mucinex for congestion and increase fluids. For cough I have sent tessalon to use TID. Other OTC medications and symptomatic care are discussed. Return and ED precautions. Patient agreeable to plan   Final Clinical Impressions(s) / UC Diagnoses   Final diagnoses:  Viral illness     Discharge Instructions      Please continue mucinex for congestion. Stay very hydrated!  The tessalon cough pills can be taken 3x daily. If this medication makes you drowsy, take only one pill before bed.  I recommend honey and throat lozenges as well  If symptoms worsen or fail to improve in the next 4-5 days, please return     ED Prescriptions     Medication Sig Dispense Auth. Provider   benzonatate (TESSALON) 100 MG capsule Take 1 capsule (100 mg total) by mouth 3 (three) times daily as needed for cough. 30 capsule Lalisa Kiehn, PA-C   albuterol (VENTOLIN HFA) 108 (90 Base) MCG/ACT inhaler Inhale 2 puffs into the lungs every 6 (six) hours as needed for wheezing or shortness of breath. 8 g Lurae Hornbrook, Lurena Joiner, PA-C      PDMP not reviewed this encounter.   Marlow Baars, [place] 06/30/23 9528

## 2023-06-30 NOTE — Discharge Instructions (Addendum)
Please continue mucinex for congestion. Stay very hydrated!  The tessalon cough pills can be taken 3x daily. If this medication makes you drowsy, take only one pill before bed.  I recommend honey and throat lozenges as well  If symptoms worsen or fail to improve in the next 4-5 days, please return

## 2023-08-03 ENCOUNTER — Other Ambulatory Visit: Payer: Self-pay | Admitting: Cardiology

## 2023-08-06 ENCOUNTER — Encounter: Payer: Self-pay | Admitting: Family Medicine

## 2023-08-06 ENCOUNTER — Ambulatory Visit (INDEPENDENT_AMBULATORY_CARE_PROVIDER_SITE_OTHER): Payer: Medicare Other | Admitting: Family Medicine

## 2023-08-06 VITALS — BP 142/80 | HR 76 | Temp 97.6°F | Ht 66.0 in | Wt 178.6 lb

## 2023-08-06 DIAGNOSIS — I10 Essential (primary) hypertension: Secondary | ICD-10-CM | POA: Diagnosis not present

## 2023-08-06 DIAGNOSIS — E7800 Pure hypercholesterolemia, unspecified: Secondary | ICD-10-CM

## 2023-08-06 DIAGNOSIS — Z131 Encounter for screening for diabetes mellitus: Secondary | ICD-10-CM | POA: Diagnosis not present

## 2023-08-06 DIAGNOSIS — E871 Hypo-osmolality and hyponatremia: Secondary | ICD-10-CM | POA: Diagnosis not present

## 2023-08-06 LAB — BASIC METABOLIC PANEL
BUN: 15 mg/dL (ref 6–23)
CO2: 29 meq/L (ref 19–32)
Calcium: 8.8 mg/dL (ref 8.4–10.5)
Chloride: 95 meq/L — ABNORMAL LOW (ref 96–112)
Creatinine, Ser: 0.93 mg/dL (ref 0.40–1.50)
GFR: 79.31 mL/min (ref >60.00)
Glucose, Bld: 92 mg/dL (ref 70–99)
Potassium: 4.7 meq/L (ref 3.5–5.1)
Sodium: 131 meq/L — ABNORMAL LOW (ref 135–145)

## 2023-08-06 LAB — LDL CHOLESTEROL, DIRECT: Direct LDL: 73 mg/dL

## 2023-08-06 LAB — HEMOGLOBIN A1C: Hgb A1c MFr Bld: 5.9 % (ref 4.6–6.5)

## 2023-08-06 NOTE — Progress Notes (Addendum)
 Established Patient Office Visit   Subjective:  Patient ID: Jonathon Johnson, male    DOB: 1946-03-17  Age: 78 y.o. MRN: [id number]  Chief Complaint  Patient presents with   Medical Management of Chronic Issues    6 mointh follow up. Pt is fasting.    HPI Encounter Diagnoses  Name Primary?   Elevated LDL cholesterol level Yes   Essential hypertension    Screening for diabetes mellitus    Hyponatremia    Follow-up of above.  Continue study with cardiology and atorvastatin.  Continues valsartan and carvedilol for control of hypertension.  Has not been checking it recently.  Patient is exercising by walking and going to the gym.  He has been able to lose some weight.  Continues follow-up with urology for BPH.  Feels as though balance is improved since he has been walking.   Review of Systems  Constitutional: Negative.   HENT: Negative.    Eyes:  Negative for blurred vision, discharge and redness.  Respiratory: Negative.    Cardiovascular: Negative.   Gastrointestinal:  Negative for abdominal pain.  Genitourinary: Negative.   Musculoskeletal: Negative.  Negative for myalgias.  Skin:  Negative for rash.  Neurological:  Negative for tingling, loss of consciousness and weakness.  Endo/Heme/Allergies:  Negative for polydipsia.     Current Outpatient Medications:    albuterol (VENTOLIN HFA) 108 (90 Base) MCG/ACT inhaler, Inhale 2 puffs into the lungs every 6 (six) hours as needed for wheezing or shortness of breath., Disp: 8 g, Rfl: 1   amitriptyline (ELAVIL) 25 MG tablet, Take 25 mg by mouth at bedtime., Disp: , Rfl:    aspirin EC 81 MG tablet, Take 81 mg by mouth daily. Swallow whole., Disp: , Rfl:    atropine 1 % ophthalmic solution, Place 1 drop into the right eye daily., Disp: , Rfl:    carvedilol (COREG) 12.5 MG tablet, TAKE 1 TABLET TWICE A DAY WITH MEALS, Disp: 60 tablet, Rfl: 0   finasteride (PROSCAR) 5 MG tablet, TAKE 1 TABLET DAILY, Disp: 90 tablet, Rfl: 3   fluticasone  (FLONASE) 50 MCG/ACT nasal spray, Place 2 sprays into both nostrils daily., Disp: 48 g, Rfl: 3   ibuprofen (ADVIL) 600 MG tablet, Take 600 mg by mouth every 6 (six) hours as needed., Disp: , Rfl:    Multiple Vitamin (MULTIVITAMIN) tablet, Take 1 tablet by mouth daily., Disp: , Rfl:    omeprazole (PRILOSEC) 40 MG capsule, TAKE 1 CAPSULE DAILY, Disp: 90 capsule, Rfl: 3   rosuvastatin (CRESTOR) 20 MG tablet, TAKE 1 TABLET DAILY, Disp: 90 tablet, Rfl: 3   Study - VICTORION-1 PREVENT - inclisiran 300 mg/1.79mL or placebo SQ injection (PI-Stuckey), Inject 300 mg into the skin every 6 (six) months. For Investigational Use Only. Inject 1.5 mL subcutaneously into the abdomen, upper arm or thigh. Do not inject into areas of active skin disease, such as sunburns, skin rashes, inflammation, or skin infections. Administered on day 1, day 90, and every 6 months thereafter. Please contact [facility] for any questions or concerns regarding this medication., Disp: , Rfl:    tamsulosin (FLOMAX) 0.4 MG CAPS capsule, TAKE 2 CAPSULES DAILY AFTER BREAKFAST, Disp: 180 capsule, Rfl: 3   valsartan (DIOVAN) 160 MG tablet, TAKE 1 TABLET DAILY, Disp: 90 tablet, Rfl: 2   WIXELA INHUB 250-50 MCG/ACT AEPB, USE 1 INHALATION TWICE A DAY, Disp: 180 each, Rfl: 3   methocarbamol (ROBAXIN) 500 MG tablet, Take by mouth every 6 (six) hours  as needed., Disp: , Rfl:   Current Facility-Administered Medications:    Study - VICTORION-1 PREVENT - inclisiran 300 mg/1.58mL or placebo SQ injection (PI-Stuckey), 300 mg, Subcutaneous, Q6 months, , 300 mg at 03/06/23 0858   Objective:     BP (!) 142/80   Pulse 76   Temp 97.6 F (36.4 C)   Ht 5\' 6"  (1.676 m)   Wt 178 lb 9.6 oz (81 kg)   SpO2 98%   BMI 28.83 kg/m  BP Readings from Last 3 Encounters:  08/06/23 (!) 142/80  06/30/23 (!) 153/89  03/06/23 138/77   Wt Readings from Last 3 Encounters:  08/06/23 178 lb 9.6 oz (81 kg)  03/06/23 175 lb 3.2 oz  (79.5 kg)  02/19/23 180 lb 3.2 oz (81.7 kg)      Physical Exam Constitutional:      General: He is not in acute distress.    Appearance: Normal appearance. He is not ill-appearing, toxic-appearing or diaphoretic.  HENT:     Head: Normocephalic and atraumatic.     Right Ear: External ear normal.     Left Ear: External ear normal.  Eyes:     General: No scleral icterus.       Right eye: No discharge.        Left eye: No discharge.     Extraocular Movements: Extraocular movements intact.     Conjunctiva/sclera: Conjunctivae normal.  Cardiovascular:     Rate and Rhythm: Normal rate and regular rhythm.  Pulmonary:     Effort: Pulmonary effort is normal. No respiratory distress.     Breath sounds: Normal breath sounds. No wheezing, rhonchi or rales.  Musculoskeletal:     Cervical back: No rigidity or tenderness.  Skin:    General: Skin is warm and dry.  Neurological:     Mental Status: He is alert and oriented to person, place, and time.  Psychiatric:        Mood and Affect: Mood normal.        Behavior: Behavior normal.      Results for orders placed or performed in visit on 08/06/23  Basic metabolic panel  Result Value Ref Range   Sodium 131 (L) 135 - 145 mEq/L   Potassium 4.7 3.5 - 5.1 mEq/L   Chloride 95 (L) 96 - 112 mEq/L   CO2 29 19 - 32 mEq/L   Glucose, Bld 92 70 - 99 mg/dL   BUN 15 6 - 23 mg/dL   Creatinine, Ser 7.84 0.40 - 1.50 mg/dL   GFR 69.62 >95.28 mL/min   Calcium 8.8 8.4 - 10.5 mg/dL  Hemoglobin U1L  Result Value Ref Range   Hgb A1c MFr Bld 5.9 4.6 - 6.5 %  LDL cholesterol, direct  Result Value Ref Range   Direct LDL 73.0 mg/dL      The ASCVD Risk score (Arnett DK, et al., 2019) failed to calculate for the following reasons:   The systolic blood pressure is missing    Assessment & Plan:   Elevated LDL cholesterol level -     LDL cholesterol, direct  Essential hypertension -     Basic metabolic panel  Screening for diabetes mellitus -      Basic metabolic panel -     Hemoglobin A1c  Hyponatremia -     Sodium, urine, random; Future -     Osmolality, urine; Future -     Arginine vasopressin hormone; Future -     Osmolality; Future -  Ambulatory referral to Nephrology    Return in about 6 months (around 02/03/2024), or Continue exercise and weight loss efforts.  Check and record blood pressures periodically..  Continue exercise and weight loss efforts.  Check and record blood pressures periodically.  Information given on managing hypertension.  Mliss Sax, MD

## 2023-08-07 ENCOUNTER — Ambulatory Visit: Payer: Medicare Other

## 2023-08-07 DIAGNOSIS — E871 Hypo-osmolality and hyponatremia: Secondary | ICD-10-CM | POA: Insufficient documentation

## 2023-08-07 DIAGNOSIS — Z0000 Encounter for general adult medical examination without abnormal findings: Secondary | ICD-10-CM

## 2023-08-07 NOTE — Addendum Note (Signed)
 Addended by: Andrez Grime on: 08/07/2023 08:23 AM   Modules accepted: Orders

## 2023-08-07 NOTE — Patient Instructions (Signed)
 Mr. Burrous , Thank you for taking time to come for your Medicare Wellness Visit. I appreciate your ongoing commitment to your health goals. Please review the following plan we discussed and let me know if I can assist you in the future.   Referrals/Orders/Follow-Ups/Clinician Recommendations: none  This is a list of the screening recommended for you and due dates:  Health Maintenance  Topic Date Due   Eye exam for diabetics  06/10/2023   Medicare Annual Wellness Visit  08/06/2024   DTaP/Tdap/Td vaccine (3 - Td or Tdap) 06/27/2029   Pneumonia Vaccine  Completed   Flu Shot  Completed   COVID-19 Vaccine  Completed   Hepatitis C Screening  Completed   Zoster (Shingles) Vaccine  Completed   HPV Vaccine  Aged Out   Colon Cancer Screening  Discontinued    Advanced directives: (Copy Requested) Please bring a copy of your health care power of attorney and living will to the office to be added to your chart at your convenience.  Next Medicare Annual Wellness Visit scheduled for next year: Yes  insert Preventive Care attachment Insert FALL PREVENTION attachment if needed

## 2023-08-07 NOTE — Progress Notes (Signed)
 Subjective:   Jonathon Johnson is a 78 y.o. who presents for a Medicare Wellness preventive visit.  Visit Complete: Virtual I connected with  Menno Jennette on 08/07/23 by a audio enabled telemedicine application and verified that I am speaking with the correct person using two identifiers.  Patient Location: Home  Provider Location: Office/Clinic  I discussed the limitations of evaluation and management by telemedicine. The patient expressed understanding and agreed to proceed.  Vital Signs: Because this visit was a virtual/telehealth visit, some criteria may be missing or patient reported. Any vitals not documented were not able to be obtained and vitals that have been documented are patient reported.  VideoError- Librarian, academic were attempted between this provider and patient, however failed, due to patient having technical difficulties OR patient did not have access to video capability.  We continued and completed visit with audio only.   AWV Questionnaire: Yes: Patient Medicare AWV questionnaire was completed by the patient on 08/03/2023; I have confirmed that all information answered by patient is correct and no changes since this date.  Cardiac Risk Factors include: advanced age (>76men, >17 women);hypertension;male gender     Objective:    Today's Vitals   There is no height or weight on file to calculate BMI.     08/07/2023    8:12 AM 07/28/2022    3:16 PM 12/31/2021    7:52 AM 09/13/2021    8:57 AM 07/24/2021    3:44 PM 07/10/2020    8:58 AM 06/22/2019    9:42 AM  Advanced Directives  Does Patient Have a Medical Advance Directive? Yes Yes No Yes Yes No No  Type of Sales promotion account executive of [employer] Power of [employer] Power of Attorney    Does patient want to make changes to medical advance directive?     Yes (Inpatient - patient defers changing a medical advance directive and declines  information at this time)  No - Patient declined  Copy of Healthcare Power of Attorney in Chart? No - copy requested No - copy requested   No - copy requested    Would patient like information on creating a medical advance directive?      No - Patient declined     Current Medications (verified) Outpatient Encounter Medications as of 08/07/2023  Medication Sig   albuterol (VENTOLIN HFA) 108 (90 Base) MCG/ACT inhaler Inhale 2 puffs into the lungs every 6 (six) hours as needed for wheezing or shortness of breath.   amitriptyline (ELAVIL) 25 MG tablet Take 25 mg by mouth at bedtime.   aspirin EC 81 MG tablet Take 81 mg by mouth daily. Swallow whole.   atropine 1 % ophthalmic solution Place 1 drop into the right eye daily.   carvedilol (COREG) 12.5 MG tablet TAKE 1 TABLET TWICE A DAY WITH MEALS   finasteride (PROSCAR) 5 MG tablet TAKE 1 TABLET DAILY   fluticasone (FLONASE) 50 MCG/ACT nasal spray Place 2 sprays into both nostrils daily.   ibuprofen (ADVIL) 600 MG tablet Take 600 mg by mouth every 6 (six) hours as needed.   methocarbamol (ROBAXIN) 500 MG tablet Take by mouth every 6 (six) hours as needed.   Multiple Vitamin (MULTIVITAMIN) tablet Take 1 tablet by mouth daily.   omeprazole (PRILOSEC) 40 MG capsule TAKE 1 CAPSULE DAILY   rosuvastatin (CRESTOR) 20 MG tablet TAKE 1 TABLET DAILY   Study - VICTORION-1 PREVENT - inclisiran 300 mg/1.22mL or placebo SQ  injection (PI-Stuckey) Inject 300 mg into the skin every 6 (six) months. For Investigational Use Only. Inject 1.5 mL subcutaneously into the abdomen, upper arm or thigh. Do not inject into areas of active skin disease, such as sunburns, skin rashes, inflammation, or skin infections. Administered on day 1, day 90, and every 6 months thereafter. Please contact [facility] for any questions or concerns regarding this medication.   tamsulosin (FLOMAX) 0.4 MG CAPS capsule TAKE 2 CAPSULES DAILY AFTER BREAKFAST   valsartan  (DIOVAN) 160 MG tablet TAKE 1 TABLET DAILY   WIXELA INHUB 250-50 MCG/ACT AEPB USE 1 INHALATION TWICE A DAY   benzonatate (TESSALON) 100 MG capsule Take 1 capsule (100 mg total) by mouth 3 (three) times daily as needed for cough. (Patient not taking: Reported on 08/07/2023)   Facility-Administered Encounter Medications as of 08/07/2023  Medication   Study - VICTORION-1 PREVENT - inclisiran 300 mg/1.86mL or placebo SQ injection (PI-Stuckey)    Allergies (verified) Amoxicillin and Morphine and codeine   History: Past Medical History:  Diagnosis Date   Asthma    Chicken pox    GERD (gastroesophageal reflux disease)    Hyperlipidemia 11/04/2016   Hypertension 07/19/2018   Past Surgical History:  Procedure Laterality Date   CATARACT EXTRACTION Bilateral    02/2023   CHOLECYSTECTOMY     TRANSFORAMINAL LUMBAR INTERBODY FUSION (TLIF) WITH PEDICLE SCREW FIXATION 1 LEVEL Right 09/25/2021   Procedure: RIGHT-SIDED TRANSFORAMINAL LUMBAR INTERBODY FUSION AND DECOMPRESSION LUMBAR 4-  LUMBAR 5 WITH INSTRUMENTATION AND ALLOGRAFT;  Surgeon: Estill Bamberg, MD;  Location: [facility] OR;  Service: Orthopedics;  Laterality: Right;   Family History  Problem Relation Age of Onset   Alcohol abuse Mother    Depression Mother    Diabetes Mother    Alcohol abuse Father    COPD Father    Cancer Sister    Hypertension Brother    Migraines Daughter    Multiple sclerosis Daughter    Social History   Socioeconomic History   Marital status: Married    Spouse name: Not on file   Number of children: Not on file   Years of education: Not on file   Highest education level: Some college, no degree  Occupational History   Not on file  Tobacco Use   Smoking status: Former    Current packs/day: 0.00    Types: Cigarettes    Quit date: 07/19/2010    Years since quitting: 13.0   Smokeless tobacco: Never  Vaping Use   Vaping status: Never Used  Substance and Sexual Activity   Alcohol use: Yes    Alcohol/week:  14.0 standard drinks of alcohol    Types: 14 Glasses of wine per week    Comment: 3-4 glasses of wine daily   Drug use: Never   Sexual activity: Yes    Partners: Female  Other Topics Concern   Not on file  Social History Narrative   Right handed   Social Drivers of Health   Financial Resource Strain: Low Risk  (08/07/2023)   Overall Financial Resource Strain (CARDIA)    Difficulty of Paying Living Expenses: Not hard at all  Food Insecurity: No Food Insecurity (08/07/2023)   Hunger Vital Sign    Worried About Running Out of Food in the Last Year: Never true    Ran Out of Food in the Last Year: Never true  Transportation Needs: No Transportation Needs (08/07/2023)   PRAPARE - Administrator, Civil Service (Medical):  No    Lack of Transportation (Non-Medical): No  Physical Activity: Sufficiently Active (08/07/2023)   Exercise Vital Sign    Days of Exercise per Week: 3 days    Minutes of Exercise per Session: 50 min  Recent Concern: Physical Activity - Insufficiently Active (08/02/2023)   Exercise Vital Sign    Days of Exercise per Week: 3 days    Minutes of Exercise per Session: 40 min  Stress: No Stress Concern Present (08/07/2023)   [name] Stress Questionnaire    Feeling of Stress : Not at all  Social Connections: Moderately Integrated (08/07/2023)   Social Connection and Isolation Panel [NHANES]    Frequency of Communication with Friends and Family: Three times a week    Frequency of Social Gatherings with Friends and Family: Once a week    Attends Religious Services: More than 4 times per year    Active Member of [employer] or Organizations: No    Attends Banker Meetings: Never    Marital Status: Married  Recent Concern: Social Connections - Moderately Isolated (08/02/2023)   Social Connection and Isolation Panel [NHANES]    Frequency of Communication with Friends and Family: Once a week    Frequency of Social  Gatherings with Friends and Family: Once a week    Attends Religious Services: More than 4 times per year    Active Member of [employer] or Organizations: No    Attends Engineer, structural: Not on file    Marital Status: Married    Tobacco Counseling Counseling given: Not Answered    Clinical Intake:  Pre-visit preparation completed: Yes  Pain : No/denies pain     Nutritional Risks: None Diabetes: No  How often do you need to have someone help you when you read instructions, pamphlets, or other written materials from your doctor or pharmacy?: 1 - Never  Interpreter Needed?: No  Information entered by :: NAllen LPN   Activities of Daily Living     08/03/2023    8:41 AM  In your present state of health, do you have any difficulty performing the following activities:  Hearing? 0  Vision? 0  Difficulty concentrating or making decisions? 0  Walking or climbing stairs? 0  Dressing or bathing? 0  Doing errands, shopping? 0  Preparing Food and eating ? N  Using the Toilet? N  In the past six months, have you accidently leaked urine? N  Do you have problems with loss of bowel control? N  Managing your Medications? N  Managing your Finances? N  Housekeeping or managing your Housekeeping? N    Patient Care Team: Mliss Sax, MD as PCP - General (Family Medicine) Little Ishikawa, MD as PCP - Cardiology (Cardiology)  Indicate any recent Medical Services you may have received from other than [facility] providers in the past year (date may be approximate).     Assessment:   This is a routine wellness examination for Demondre.  Hearing/Vision screen Hearing Screening - Comments:: Denies hearing issues Vision Screening - Comments:: Regular eye exams, [facility], Dr. Sherrine Maples   Goals Addressed             This Visit's Progress    Patient Stated       08/07/2023, get BP down a little bit more       Depression Screen     08/07/2023     8:15 AM 08/07/2022    9:32 AM 07/28/2022  3:19 PM 02/03/2022   10:19 AM 07/31/2021   11:40 AM 07/24/2021    3:41 PM 11/08/2020    1:08 PM  PHQ 2/9 Scores  PHQ - 2 Score 0 0 0 0 0 0 0  PHQ- 9 Score 0          Fall Risk     08/03/2023    8:41 AM 08/07/2022    9:32 AM 07/28/2022    3:17 PM 07/24/2022    9:11 AM 02/03/2022   10:18 AM  Fall Risk   Falls in the past year? 1 0 1 1 1   Comment tripped over shrub, lost balance  turns too quickly    Number falls in past yr: 1 0 1 1 0  Injury with Fall? 1 0 0 0 0  Comment jammed thumb      Risk for fall due to : Medication side effect;History of fall(s) No Fall Risks History of fall(s);Medication side effect;Impaired balance/gait    Follow up Falls prevention discussed;Falls evaluation completed Falls evaluation completed Falls prevention discussed;Education provided;Falls evaluation completed      MEDICARE RISK AT HOME:  Medicare Risk at Home Any stairs in or around the home?: (Patient-Rptd) Yes If so, are there any without handrails?: (Patient-Rptd) No Home free of loose throw rugs in walkways, pet beds, electrical cords, etc?: (Patient-Rptd) Yes Adequate lighting in your home to reduce risk of falls?: (Patient-Rptd) Yes Life alert?: (Patient-Rptd) No Use of a cane, walker or w/c?: (Patient-Rptd) No Grab bars in the bathroom?: (Patient-Rptd) Yes Shower chair or bench in shower?: (Patient-Rptd) No Elevated toilet seat or a handicapped toilet?: Yes  TIMED UP AND GO:  Was the test performed?  No  Cognitive Function: 6CIT completed        08/07/2023    8:17 AM 07/28/2022    3:20 PM 07/10/2020    9:09 AM  6CIT Screen  What Year? 0 points 0 points 0 points  What month? 0 points 0 points 0 points  What time? 0 points 0 points 0 points  Count back from 20 0 points 0 points 0 points  Months in reverse 0 points 0 points 0 points  Repeat phrase 0 points 2 points 0 points  Total Score 0 points 2 points 0 points     Immunizations Immunization History  Administered Date(s) Administered   Fluad Quad(high Dose 65+) 02/09/2019, 02/21/2022   Influenza-Unspecified 03/11/2018, 02/15/2020, 03/18/2021, 02/23/2023   PFIZER Comirnaty(Gray Top)Covid-19 Tri-Sucrose Vaccine 09/18/2020   PFIZER(Purple Top)SARS-COV-2 Vaccination 07/24/2019, 08/16/2019, 03/12/2020, 02/21/2021   Pfizer Covid-19 Vaccine Bivalent Booster 20yrs & up 03/07/2021   Pneumococcal Conjugate-13 06/28/2019   Pneumococcal Polysaccharide-23 05/04/2012   Tdap 03/27/2009, 06/28/2019   Unspecified SARS-COV-2 Vaccination 02/23/2023   Zoster Recombinant(Shingrix) 08/06/2021, 11/12/2021   Zoster, Live 07/16/2012    Screening Tests Health Maintenance  Topic Date Due   OPHTHALMOLOGY EXAM  06/10/2023   Medicare Annual Wellness (AWV)  08/06/2024   DTaP/Tdap/Td (3 - Td or Tdap) 06/27/2029   Pneumonia Vaccine 59+ Years old  Completed   INFLUENZA VACCINE  Completed   COVID-19 Vaccine  Completed   Hepatitis C Screening  Completed   Zoster Vaccines- Shingrix  Completed   HPV VACCINES  Aged Out   Colonoscopy  Discontinued    Health Maintenance  Health Maintenance Due  Topic Date Due   OPHTHALMOLOGY EXAM  06/10/2023   Health Maintenance Items Addressed: Everything up to date  Additional Screening:  Vision Screening: Recommended annual ophthalmology exams for early detection  of glaucoma and other disorders of the eye.  Dental Screening: Recommended annual dental exams for proper oral hygiene  Community Resource Referral / Chronic Care Management: CRR required this visit?  No   CCM required this visit?  No     Plan:     I have personally reviewed and noted the following in the patient's chart:   Medical and social history Use of alcohol, tobacco or illicit drugs  Current medications and supplements including opioid prescriptions. Patient is not currently taking opioid prescriptions. Functional ability and status Nutritional  status Physical activity Advanced directives List of other physicians Hospitalizations, surgeries, and ER visits in previous 12 months Vitals Screenings to include cognitive, depression, and falls Referrals and appointments  In addition, I have reviewed and discussed with patient certain preventive protocols, quality metrics, and best practice recommendations. A written personalized care plan for preventive services as well as general preventive health recommendations were provided to patient.     Barb Merino, LPN   1/61/0960   After Visit Summary: (MyChart) Due to this being a telephonic visit, the after visit summary with patients personalized plan was offered to patient via MyChart   Notes: Nothing significant to report at this time.

## 2023-08-17 ENCOUNTER — Other Ambulatory Visit

## 2023-08-17 DIAGNOSIS — E871 Hypo-osmolality and hyponatremia: Secondary | ICD-10-CM

## 2023-08-18 LAB — SODIUM, URINE, RANDOM: Sodium, Ur: 21 mmol/L — ABNORMAL LOW (ref 28–272)

## 2023-08-18 NOTE — Addendum Note (Signed)
 Addended by: Nadene Rubins A on: 08/18/2023 01:04 PM   Modules accepted: Orders

## 2023-08-19 ENCOUNTER — Other Ambulatory Visit

## 2023-08-19 DIAGNOSIS — E871 Hypo-osmolality and hyponatremia: Secondary | ICD-10-CM

## 2023-08-21 LAB — OSMOLALITY, URINE: Osmolality, Ur: 312 mosm/kg (ref 50–1200)

## 2023-08-21 LAB — OSMOLALITY: Osmolality: 281 mosm/kg (ref 278–305)

## 2023-08-25 ENCOUNTER — Ambulatory Visit: Payer: Medicare Other | Admitting: Cardiology

## 2023-08-26 LAB — ARGININE VASOPRESSIN HORMONE
ADH: 2.2 pg/mL (ref 0.0–4.7)
Osmolality Meas: 274 mosm/kg — ABNORMAL LOW (ref 280–301)

## 2023-08-27 NOTE — Addendum Note (Signed)
 Addended by: Andrez Grime on: 08/27/2023 09:56 AM   Modules accepted: Orders

## 2023-08-31 NOTE — Progress Notes (Unsigned)
 [name] [address] [place] [address] Phone: [phone] Subjective:   INadine Counts, am serving as a scribe for Dr. Antoine Primas.  I'm seeing this patient by the request  of:  Mliss Sax, MD  CC: Left thigh pain  [id number]  Jonathon Johnson is a 78 y.o. male coming in with complaint of L thigh pain. Last seen in 2022 for lumbar spine pain with sciatica. Patient states the pain began 2 weeks. Pain has stayed about the same. Uncomfortable and annoying. Takes 800mg  motrin. Wake him at night.  Patient since we have seen him he did undergo a interbody fusion at L4-L5.  This was in April 2023.    Past Medical History:  Diagnosis Date   Asthma    Chicken pox    GERD (gastroesophageal reflux disease)    Hyperlipidemia 11/04/2016   Hypertension 07/19/2018   Past Surgical History:  Procedure Laterality Date   CATARACT EXTRACTION Bilateral    02/2023   CHOLECYSTECTOMY     TRANSFORAMINAL LUMBAR INTERBODY FUSION (TLIF) WITH PEDICLE SCREW FIXATION 1 LEVEL Right 09/25/2021   Procedure: RIGHT-SIDED TRANSFORAMINAL LUMBAR INTERBODY FUSION AND DECOMPRESSION LUMBAR 4-  LUMBAR 5 WITH INSTRUMENTATION AND ALLOGRAFT;  Surgeon: Estill Bamberg, MD;  Location: [facility] OR;  Service: Orthopedics;  Laterality: Right;   Social History   Socioeconomic History   Marital status: Married    Spouse name: Not on file   Number of children: Not on file   Years of education: Not on file   Highest education level: Some college, no degree  Occupational History   Not on file  Tobacco Use   Smoking status: Former    Current packs/day: 0.00    Types: Cigarettes    Quit date: 07/19/2010    Years since quitting: 13.1   Smokeless tobacco: Never  Vaping Use   Vaping status: Never Used  Substance and Sexual Activity   Alcohol use: Yes    Alcohol/week: 14.0 standard drinks of alcohol    Types: 14 Glasses of wine per week    Comment: 3-4 glasses of wine daily    Drug use: Never   Sexual activity: Yes    Partners: Female  Other Topics Concern   Not on file  Social History Narrative   Right handed   Social Drivers of Health   Financial Resource Strain: Low Risk  (08/07/2023)   Overall Financial Resource Strain (CARDIA)    Difficulty of Paying Living Expenses: Not hard at all  Food Insecurity: No Food Insecurity (08/07/2023)   Hunger Vital Sign    Worried About Running Out of Food in the Last Year: Never true    Ran Out of Food in the Last Year: Never true  Transportation Needs: No Transportation Needs (08/07/2023)   PRAPARE - Administrator, Civil Service (Medical): No    Lack of Transportation (Non-Medical): No  Physical Activity: Sufficiently Active (08/07/2023)   Exercise Vital Sign    Days of Exercise per Week: 3 days    Minutes of Exercise per Session: 50 min  Recent Concern: Physical Activity - Insufficiently Active (08/02/2023)   Exercise Vital Sign    Days of Exercise per Week: 3 days    Minutes of Exercise per Session: 40 min  Stress: No Stress Concern Present (08/07/2023)   [name] Stress Questionnaire    Feeling of Stress : Not at all  Social Connections: Moderately Integrated (08/07/2023)  Social Connection and Isolation Panel [NHANES]    Frequency of Communication with Friends and Family: Three times a week    Frequency of Social Gatherings with Friends and Family: Once a week    Attends Religious Services: More than 4 times per year    Active Member of [employer] or Organizations: No    Attends Engineer, structural: Never    Marital Status: Married  Recent Concern: Social Connections - Moderately Isolated (08/02/2023)   Social Connection and Isolation Panel [NHANES]    Frequency of Communication with Friends and Family: Once a week    Frequency of Social Gatherings with Friends and Family: Once a week    Attends Religious Services: More than 4 times per year     Active Member of [employer] or Organizations: No    Attends Engineer, structural: Not on file    Marital Status: Married   Allergies  Allergen Reactions   Amoxicillin Diarrhea   Morphine And Codeine Nausea And Vomiting   Family History  Problem Relation Age of Onset   Alcohol abuse Mother    Depression Mother    Diabetes Mother    Alcohol abuse Father    COPD Father    Cancer Sister    Hypertension Brother    Migraines Daughter    Multiple sclerosis Daughter       Current Outpatient Medications (Cardiovascular):    carvedilol (COREG) 12.5 MG tablet, TAKE 1 TABLET TWICE A DAY WITH MEALS   rosuvastatin (CRESTOR) 20 MG tablet, TAKE 1 TABLET DAILY   Study - VICTORION-1 PREVENT - inclisiran 300 mg/1.74mL or placebo SQ injection (PI-Stuckey), Inject 300 mg into the skin every 6 (six) months. For Investigational Use Only. Inject 1.5 mL subcutaneously into the abdomen, upper arm or thigh. Do not inject into areas of active skin disease, such as sunburns, skin rashes, inflammation, or skin infections. Administered on day 1, day 90, and every 6 months thereafter. Please contact [facility] for any questions or concerns regarding this medication.   valsartan (DIOVAN) 160 MG tablet, TAKE 1 TABLET DAILY  Current Facility-Administered Medications (Cardiovascular):    Study - VICTORION-1 PREVENT - inclisiran 300 mg/1.34mL or placebo SQ injection (PI-Stuckey)  Current Outpatient Medications (Respiratory):    albuterol (VENTOLIN HFA) 108 (90 Base) MCG/ACT inhaler, Inhale 2 puffs into the lungs every 6 (six) hours as needed for wheezing or shortness of breath.   fluticasone (FLONASE) 50 MCG/ACT nasal spray, Place 2 sprays into both nostrils daily.   WIXELA INHUB 250-50 MCG/ACT AEPB, USE 1 INHALATION TWICE A DAY   Current Outpatient Medications (Analgesics):    aspirin EC 81 MG tablet, Take 81 mg by mouth daily. Swallow whole.   ibuprofen (ADVIL) 600 MG  tablet, Take 600 mg by mouth every 6 (six) hours as needed.     Current Outpatient Medications (Other):    amitriptyline (ELAVIL) 25 MG tablet, Take 25 mg by mouth at bedtime.   atropine 1 % ophthalmic solution, Place 1 drop into the right eye daily.   finasteride (PROSCAR) 5 MG tablet, TAKE 1 TABLET DAILY   methocarbamol (ROBAXIN) 500 MG tablet, Take by mouth every 6 (six) hours as needed.   Multiple Vitamin (MULTIVITAMIN) tablet, Take 1 tablet by mouth daily.   omeprazole (PRILOSEC) 40 MG capsule, TAKE 1 CAPSULE DAILY   tamsulosin (FLOMAX) 0.4 MG CAPS capsule, TAKE 2 CAPSULES DAILY AFTER BREAKFAST    Reviewed prior external information including notes and imaging from  primary care provider As well as notes that were available from care everywhere and other healthcare systems.  Past medical history, social, surgical and family history all reviewed in electronic medical record.  No pertanent information unless stated regarding to the chief complaint.   Review of Systems:  No headache, visual changes, nausea, vomiting, diarrhea, constipation, dizziness, abdominal pain, skin rash, fevers, chills, night sweats, weight loss, swollen lymph nodes, body aches, joint swelling, chest pain, shortness of breath, mood changes. POSITIVE muscle aches  Objective  Blood pressure 118/70, pulse 72, height 5\' 6"  (1.676 m), weight 178 lb (80.7 kg), SpO2 95%.   General: No apparent distress alert and oriented x3 mood and affect normal, dressed appropriately.  HEENT: Pupils equal, extraocular movements intact  Respiratory: Patient's speak in full sentences and does not appear short of breath  Cardiovascular: No lower extremity edema, non tender, no erythema  Left leg shows antalgic gait noted.  Patient does have significant limitation in internal rotation of the left hip compared to the contralateral side.  Tightness even with FABER test noted.  Severe tenderness noted to palpation over the left greater  trochanteric area.   After verbal consent patient was prepped with alcohol swab and with a 21-gauge 2 inch needle injected into the left greater trochanteric area with 2 cc of 0.5% Marcaine and 1 cc of Kenalog 40 mg/mL.  No blood loss.  Band-Aid placed.  Postinjection instructions given    Impression and Recommendations:    The above documentation has been reviewed and is accurate and complete Judi Saa, DO

## 2023-09-01 ENCOUNTER — Ambulatory Visit (INDEPENDENT_AMBULATORY_CARE_PROVIDER_SITE_OTHER): Admitting: Family Medicine

## 2023-09-01 ENCOUNTER — Ambulatory Visit (INDEPENDENT_AMBULATORY_CARE_PROVIDER_SITE_OTHER)

## 2023-09-01 ENCOUNTER — Encounter: Payer: Self-pay | Admitting: Family Medicine

## 2023-09-01 VITALS — BP 118/70 | HR 72 | Ht 66.0 in | Wt 178.0 lb

## 2023-09-01 DIAGNOSIS — M25552 Pain in left hip: Secondary | ICD-10-CM

## 2023-09-01 DIAGNOSIS — M160 Bilateral primary osteoarthritis of hip: Secondary | ICD-10-CM | POA: Diagnosis not present

## 2023-09-01 DIAGNOSIS — M7062 Trochanteric bursitis, left hip: Secondary | ICD-10-CM

## 2023-09-01 DIAGNOSIS — M1612 Unilateral primary osteoarthritis, left hip: Secondary | ICD-10-CM

## 2023-09-01 NOTE — Assessment & Plan Note (Signed)
 Patient given injection and tolerated the procedure well, discussed icing regimen of home exercises, discussed which activities to do and which ones to avoid.  Increase activity slowly.  I do think that this is secondary to the compensation for the underlying hip arthritis but hopefully will help him with some of the nighttime pain that he was experiencing.  Patient is in agreement with the plan.  Follow-up again in 2 months

## 2023-09-01 NOTE — Patient Instructions (Addendum)
 PT at [facility] injection today Do prescribed exercises at least 3x a week Good to see you! See you again in 2 months

## 2023-09-01 NOTE — Assessment & Plan Note (Signed)
 Left hip arthritis noted.  Moderate to severe.  This seems to be more on the anterior inferior aspect than anywhere else.  Could be consistent with patient's radicular symptoms.  Discussed with patient conservative management versus potential surgical intervention.  Patient would like to consider the conservative aspect first.  Patient will be referred for formal physical therapy.  Discussed with patient about icing regimen and home exercises including hip abductor strengthening.  Worsening groin pain or instability will need to consider the possibility of advanced imaging.  Patient is consistent with this.  Discussed which activities to do and which ones to avoid.  Follow-up again in 6 to 8 weeks to further evaluate.  And discuss other treatment options if necessary for referral.

## 2023-09-02 ENCOUNTER — Encounter: Payer: Medicare Other | Admitting: *Deleted

## 2023-09-02 DIAGNOSIS — Z006 Encounter for examination for normal comparison and control in clinical research program: Secondary | ICD-10-CM

## 2023-09-02 MED ORDER — STUDY - VICTORION-1 PREVENT - INCLISIRAN 300 MG/1.5 ML OR PLACEBO SQ INJECTION (PI-STUCKEY)
300.0000 mg | INJECTION | SUBCUTANEOUS | Status: AC
  Administered 2023-09-02: 300 mg via SUBCUTANEOUS
  Filled 2023-09-02: qty 1.5

## 2023-09-02 NOTE — Research (Addendum)
 DATE: 02-September-2023  SUBJECT ID: [id number]  Visit: V5         Prior or concomitant medications reviewed [x]   Lipid lowering medications reviewed [x]   Surgical history reviewed [x]   TIME: BP: PULSE:  0845 164/95 83  0846 177/96 77  0848 157/94 77  Mean BP: 166/94   Mean Pulse: 79  Labs collected at: 0909 Fasting Lipid Profile [x]  Fasting Lp(a) []  Liver function tests []  Exploratory biomarkers []   AE/SAE assessment completed [x]   Study drug administered [x]   Endpoint assessment completed [x]     Jonathon Johnson is here for Visit 5 of V1P. He reports no abd pain. He did go to the  the Ed for dry cough and nasal congestion on 06-30-23. The Dr in the Ed added an albuterol inhaler. No other med changes noted.  Life style instructions reviewed with Jonathon Keaney. Injection was given in left lower abd at 0915 tol well. Kit number [id number].Scheduled next visit for 22-Sept-2025 at 0830.   Current Outpatient Medications:    albuterol (VENTOLIN HFA) 108 (90 Base) MCG/ACT inhaler, Inhale 2 puffs into the lungs every 6 (six) hours as needed for wheezing or shortness of breath., Disp: 8 g, Rfl: 1   amitriptyline (ELAVIL) 25 MG tablet, Take 25 mg by mouth at bedtime., Disp: , Rfl:    aspirin EC 81 MG tablet, Take 81 mg by mouth daily. Swallow whole., Disp: , Rfl:    atropine 1 % ophthalmic solution, Place 1 drop into the right eye daily., Disp: , Rfl:    carvedilol (COREG) 12.5 MG tablet, TAKE 1 TABLET TWICE A DAY WITH MEALS, Disp: 60 tablet, Rfl: 0   finasteride (PROSCAR) 5 MG tablet, TAKE 1 TABLET DAILY, Disp: 90 tablet, Rfl: 3   fluticasone (FLONASE) 50 MCG/ACT nasal spray, Place 2 sprays into both nostrils daily., Disp: 48 g, Rfl: 3   ibuprofen (ADVIL) 600 MG tablet, Take 600 mg by mouth every 6 (six) hours as needed., Disp: , Rfl:    methocarbamol (ROBAXIN) 500 MG tablet, Take by mouth every 6 (six) hours as needed., Disp: , Rfl:    Multiple Vitamin (MULTIVITAMIN) tablet, Take 1 tablet by  mouth daily., Disp: , Rfl:    omeprazole (PRILOSEC) 40 MG capsule, TAKE 1 CAPSULE DAILY, Disp: 90 capsule, Rfl: 3   rosuvastatin (CRESTOR) 20 MG tablet, TAKE 1 TABLET DAILY, Disp: 90 tablet, Rfl: 3   Study - VICTORION-1 PREVENT - inclisiran 300 mg/1.36mL or placebo SQ injection (PI-Stuckey), Inject 300 mg into the skin every 6 (six) months. For Investigational Use Only. Inject 1.5 mL subcutaneously into the abdomen, upper arm or thigh. Do not inject into areas of active skin disease, such as sunburns, skin rashes, inflammation, or skin infections. Administered on day 1, day 90, and every 6 months thereafter. Please contact [facility] for any questions or concerns regarding this medication., Disp: , Rfl:    tamsulosin (FLOMAX) 0.4 MG CAPS capsule, TAKE 2 CAPSULES DAILY AFTER BREAKFAST, Disp: 180 capsule, Rfl: 3   valsartan (DIOVAN) 160 MG tablet, TAKE 1 TABLET DAILY, Disp: 90 tablet, Rfl: 2   WIXELA INHUB 250-50 MCG/ACT AEPB, USE 1 INHALATION TWICE A DAY, Disp: 180 each, Rfl: 3

## 2023-09-03 ENCOUNTER — Other Ambulatory Visit: Payer: Self-pay | Admitting: Cardiology

## 2023-09-13 ENCOUNTER — Encounter: Payer: Self-pay | Admitting: Family Medicine

## 2023-09-30 ENCOUNTER — Ambulatory Visit (HOSPITAL_BASED_OUTPATIENT_CLINIC_OR_DEPARTMENT_OTHER): Attending: Family Medicine | Admitting: Physical Therapy

## 2023-09-30 ENCOUNTER — Encounter (HOSPITAL_BASED_OUTPATIENT_CLINIC_OR_DEPARTMENT_OTHER): Payer: Self-pay | Admitting: Physical Therapy

## 2023-09-30 DIAGNOSIS — M6281 Muscle weakness (generalized): Secondary | ICD-10-CM | POA: Insufficient documentation

## 2023-09-30 DIAGNOSIS — M5459 Other low back pain: Secondary | ICD-10-CM | POA: Diagnosis not present

## 2023-09-30 DIAGNOSIS — M25552 Pain in left hip: Secondary | ICD-10-CM | POA: Insufficient documentation

## 2023-09-30 DIAGNOSIS — M25652 Stiffness of left hip, not elsewhere classified: Secondary | ICD-10-CM | POA: Insufficient documentation

## 2023-09-30 NOTE — Therapy (Addendum)
 OUTPATIENT PHYSICAL THERAPY LOWER EXTREMITY EVALUATION   Patient Name: Jonathon Johnson MRN: [id number] DOB:[birth year], 78 y.o., male Today's Date: 09/30/2023  END OF SESSION:  PT End of Session - 09/30/23 0748     Visit Number 1    Number of Visits 16    Date for PT Re-Evaluation 11/25/23    PT Start Time 0800    PT Stop Time 0843    PT Time Calculation (min) 43 min    Activity Tolerance Patient tolerated treatment well;No increased pain    Behavior During Therapy [facility] for tasks assessed/performed             Past Medical History:  Diagnosis Date   Asthma    Chicken pox    GERD (gastroesophageal reflux disease)    Hyperlipidemia 11/04/2016   Hypertension 07/19/2018   Past Surgical History:  Procedure Laterality Date   CATARACT EXTRACTION Bilateral    02/2023   CHOLECYSTECTOMY     TRANSFORAMINAL LUMBAR INTERBODY FUSION (TLIF) WITH PEDICLE SCREW FIXATION 1 LEVEL Right 09/25/2021   Procedure: RIGHT-SIDED TRANSFORAMINAL LUMBAR INTERBODY FUSION AND DECOMPRESSION LUMBAR 4-  LUMBAR 5 WITH INSTRUMENTATION AND ALLOGRAFT;  Surgeon: Virl Grimes, MD;  Location: [facility] OR;  Service: Orthopedics;  Laterality: Right;   Patient Active Problem List   Diagnosis Date Noted   Arthritis of left hip 09/01/2023   Greater trochanteric bursitis of left hip 09/01/2023   Hyponatremia 08/07/2023   Radiculopathy 09/25/2021   Post herpetic neuralgia 11/08/2020   Chronic bilateral low back pain with bilateral sciatica 08/28/2020   Alcohol  use 03/16/2020   Thumb pain, right 06/28/2019   Arthralgia of right temporomandibular joint 12/20/2018   Diarrhea 12/20/2018   Screening for diabetes mellitus 07/19/2018   Insomnia 07/19/2018   Gastroesophageal reflux disease 07/19/2018   Benign prostatic hyperplasia with urinary hesitancy 07/19/2018   Essential hypertension 07/19/2018   Elevated LDL cholesterol level 07/19/2018   Cough 07/19/2018   Mild reactive airways disease 07/19/2018    PCP: Tonna Frederic, MD  REFERRING PROVIDER: Isidro Margo, DO  REFERRING DIAG:  Diagnosis  M25.552 (ICD-10-CM) - Left hip pain    THERAPY DIAG:  Pain in left hip  Stiffness of left hip, not elsewhere classified  Other low back pain  Muscle weakness (generalized)  Rationale for Evaluation and Treatment: Rehabilitation  ONSET DATE: Before Christmas  SUBJECTIVE:   SUBJECTIVE STATEMENT: Eval: Pt came in with left hip pain. Received recent injection for left trochanteric bursitis. Pt states pain and discomfort has been the same since and before the injection. Feels the pain most when walking and often has issues with endurance and balance. Is retired but wants to feel more comfortable ambulating community distances and being on his feet for longer periods of time. Pt states pain at worst is 2/10 and feels like a toothache. Pain is located deep in the hip and sometimes feels spots on the lateral hip.   PERTINENT HISTORY: (2022) LBP with neuro symptoms radiating into bilateral lower legs,  Disc space narrowing is noted throughout the lumbar spine. Mild anterolisthesis of a degenerative nature is noted at L4-5. Multilevel osteophytic changes are seen worst on the left at L1-2. No soft tissue abnormality is noted. PAIN:  Are you having pain? Yes: NPRS scale: 2/10 Pain location: deep Pain description: achy, toothache Aggravating factors: sleeping on left side Relieving factors: rest  PRECAUTIONS: None  RED FLAGS: None   WEIGHT BEARING RESTRICTIONS: No  FALLS:  Has patient fallen in last 6  months? Yes. Number of falls 2 last one was on Monday  LIVING ENVIRONMENT:   OCCUPATION: retired  PLOF: Independent  PATIENT GOALS: Find out what can help him feel better  NEXT MD VISIT: 5/27  OBJECTIVE:  Note: Objective measures were completed at Evaluation unless otherwise noted.  DIAGNOSTIC FINDINGS:  FINDINGS: 2022 Five lumbar type vertebral bodies are well visualized.  Vertebral body height is well maintained. Disc space narrowing is noted throughout the lumbar spine. Mild anterolisthesis of a degenerative nature is noted at L4-5. Multilevel osteophytic changes are seen worst on the left at L1-2. No soft tissue abnormality is noted.   IMPRESSION: Multilevel degenerative change with anterolisthesis at L4-5.  PATIENT SURVEYS:  LEFS 49/80  COGNITION: Overall cognitive status: Within functional limits for tasks assessed     SENSATION: WFL  EDEMA:    MUSCLE LENGTH:   POSTURE: rounded shoulders, forward head, and flexed trunk   PALPATION: TTP over lateral hip, IT band, glute region  LOWER EXTREMITY ROM:  Passive ROM Right eval Left eval  Hip flexion  limited  Hip extension    Hip abduction    Hip adduction    Hip internal rotation  limited  Hip external rotation  [facility]  Knee flexion    Knee extension    Ankle dorsiflexion    Ankle plantarflexion    Ankle inversion    Ankle eversion     (Blank rows = not tested) LUMBAR ROM:   Active  A/PROM  eval  Flexion   Extension   Right lateral flexion   Left lateral flexion   Right rotation   Left rotation    (Blank rows = not tested)     LOWER EXTREMITY MMT:  MMT Right eval Left eval  Hip flexion 33.6 33.5  Hip extension    Hip abduction 39.2 27.3  Hip adduction    Hip internal rotation    Hip external rotation    Knee flexion    Knee extension 51.7 34.7  Ankle dorsiflexion    Ankle plantarflexion    Ankle inversion    Ankle eversion     (Blank rows = not tested)  LOWER EXTREMITY SPECIAL TESTS:  Hip special tests: FADIR: negative  FUNCTIONAL TESTS:  Feet together eyes closed: moderate swaying Tandem: moderate swaying  GAIT: 84ft with no AD gait   Moderate trendelenburg and avoidance                                                                                                                                Treatment Date: 4/22 Manual:  Reviewed  self soft tissue mobilization in lumbar spine and hip   There-ex: Seated clamshell yellow RTB 2x10 Seated LAQ yellow RTB 2x10 Pt education on exercise grading and progressions    PATIENT EDUCATION:  Education details: HEP, symptom management, self care Person educated: Patient Education method: Explanation, Demonstration, Tactile cues, Verbal cues, and Handouts Education comprehension:  verbalized understanding and returned demonstration  HOME EXERCISE PROGRAM: Access Code: [email or web address] URL: [email or web address] Date: 09/30/2023 Prepared by: Lonzell Robin  Exercises - Standing Upper Trapezius Mobilization with Small Ball  - 1 x daily - 7 x weekly - 3 sets - 10 reps - Theracane Under Arm   - 1 x daily - 7 x weekly - 3 sets - 10 reps - Seated Long Arc Quad  - 1 x daily - 7 x weekly - 3 sets - 10 reps - Seated Hip Abduction  - 1 x daily - 7 x weekly - 3 sets - 10 reps  ASSESSMENT:  CLINICAL IMPRESSION: Eval: Patient is a 78 y.o. male who was seen today for physical therapy evaluation and treatment for left hip pain. Balance and gait were assessed noting diminished Objective measurements were taken and noted with strength deficits in hip abduction and knee extension. Trigger points were spotted in lateral hip near greater trochanter, IT band, and upper glute region. Pt was educated on at home soft tissue mobilizations with theracane and tennis ball. Exercises were performed for general strengthening. Education was provided on exercises for proper muscle activation and posture. Pt can progress balance, strength, and gait endurance. Pt will continue to benefit from skilled physical therapy to improve community ambulation and functional strength.   OBJECTIVE IMPAIRMENTS: Abnormal gait, decreased activity tolerance, decreased balance, decreased coordination, decreased endurance, decreased mobility, difficulty walking, decreased ROM, decreased strength, hypomobility, and impaired  flexibility.   ACTIVITY LIMITATIONS: carrying, lifting, bending, standing, squatting, sleeping, stairs, bathing, toileting, dressing, and locomotion level  PARTICIPATION LIMITATIONS: cleaning, laundry, shopping, community activity, and yard work  PERSONAL FACTORS: 1-2 comorbidities: Lumbar surgery, balance  are also affecting patient's functional outcome.   REHAB POTENTIAL: Good  CLINICAL DECISION MAKING: Evolving/moderate complexity fluctuation in pain levels with ambulation and balance  EVALUATION COMPLEXITY: Moderate   GOALS: Goals reviewed with patient? Yes  SHORT TERM GOALS: Target date: 10/28/2023   Pt will increase hip abduction strength by 5lbs for community ambulation. Baseline: Goal status: INITIAL  2.  Pt will increase knee extension strength by 5lbs for community ambulation.  Baseline:  Goal status: INITIAL  3.  Pt will be able to complete tandem stance with eyes closed with moderate swaying. Baseline:  Goal status: INITIAL    LONG TERM GOALS: Target date: 11/25/2023    Pt will increase hip abduction strength by 5lbs for community ambulation. Baseline:  Goal status: INITIAL  2.  Pt will increase knee extension strength by 5lbs for community ambulation.  Baseline:  Goal status: INITIAL  3.  Pt will be able to complete tandem stance with minimal swaying.  Baseline:  Goal status: INITIAL  4.  Pt will be able to ambulate community distances with increased endurance more than . Baseline:  Goal status: INITIAL    PLAN:  PT FREQUENCY: 1-2x/week  PT DURATION: 8 weeks  PLANNED INTERVENTIONS: 97164- PT Re-evaluation, 97110-Therapeutic exercises, 97530- Therapeutic activity, 97112- Neuromuscular re-education, 97535- Self Care, 40347- Manual therapy, [phone]- Gait training, [phone]- Aquatic Therapy, [phone]- Electrical stimulation (unattended), [phone]- Electrical stimulation (manual), and [id number]- Traction (mechanical)  PLAN FOR NEXT SESSION: Expand HEP,  work on balance and gait training   Kitty Perkins, PT 09/30/2023, 4:43 PM  Katheran Palms SPT   During this treatment session, the therapist was present, participating in and directing the treatment.   I have reviewed and concur with this student's documentation.   Kitty Perkins, PT 09/30/2023 4:45 PM

## 2023-09-30 NOTE — Addendum Note (Signed)
 Addended by: Kitty Perkins on: 09/30/2023 04:56 PM   Modules accepted: Orders

## 2023-10-05 ENCOUNTER — Ambulatory Visit (HOSPITAL_BASED_OUTPATIENT_CLINIC_OR_DEPARTMENT_OTHER): Payer: Self-pay | Admitting: Physical Therapy

## 2023-10-05 ENCOUNTER — Encounter (HOSPITAL_BASED_OUTPATIENT_CLINIC_OR_DEPARTMENT_OTHER): Payer: Self-pay | Admitting: Physical Therapy

## 2023-10-05 DIAGNOSIS — M6281 Muscle weakness (generalized): Secondary | ICD-10-CM

## 2023-10-05 DIAGNOSIS — M25652 Stiffness of left hip, not elsewhere classified: Secondary | ICD-10-CM

## 2023-10-05 DIAGNOSIS — M5459 Other low back pain: Secondary | ICD-10-CM | POA: Diagnosis not present

## 2023-10-05 DIAGNOSIS — M25552 Pain in left hip: Secondary | ICD-10-CM

## 2023-10-05 NOTE — Therapy (Unsigned)
 OUTPATIENT PHYSICAL THERAPY LOWER EXTREMITY EVALUATION   Patient Name: Jonathon Johnson MRN: [id number] DOB:03/23/1946, 78 y.o., male Today's Date: 10/05/2023  END OF SESSION:  PT End of Session - 10/05/23 0801     Visit Number 2    Number of Visits 16    Date for PT Re-Evaluation 11/25/23    PT Start Time 0801    PT Stop Time 0844    PT Time Calculation (min) 43 min    Activity Tolerance Patient tolerated treatment well;No increased pain    Behavior During Therapy [facility] for tasks assessed/performed              Past Medical History:  Diagnosis Date   Asthma    Chicken pox    GERD (gastroesophageal reflux disease)    Hyperlipidemia 11/04/2016   Hypertension 07/19/2018   Past Surgical History:  Procedure Laterality Date   CATARACT EXTRACTION Bilateral    02/2023   CHOLECYSTECTOMY     TRANSFORAMINAL LUMBAR INTERBODY FUSION (TLIF) WITH PEDICLE SCREW FIXATION 1 LEVEL Right 09/25/2021   Procedure: RIGHT-SIDED TRANSFORAMINAL LUMBAR INTERBODY FUSION AND DECOMPRESSION LUMBAR 4-  LUMBAR 5 WITH INSTRUMENTATION AND ALLOGRAFT;  Surgeon: Virl Grimes, MD;  Location: [facility] OR;  Service: Orthopedics;  Laterality: Right;   Patient Active Problem List   Diagnosis Date Noted   Arthritis of left hip 09/01/2023   Greater trochanteric bursitis of left hip 09/01/2023   Hyponatremia 08/07/2023   Radiculopathy 09/25/2021   Post herpetic neuralgia 11/08/2020   Chronic bilateral low back pain with bilateral sciatica 08/28/2020   Alcohol  use 03/16/2020   Thumb pain, right 06/28/2019   Arthralgia of right temporomandibular joint 12/20/2018   Diarrhea 12/20/2018   Screening for diabetes mellitus 07/19/2018   Insomnia 07/19/2018   Gastroesophageal reflux disease 07/19/2018   Benign prostatic hyperplasia with urinary hesitancy 07/19/2018   Essential hypertension 07/19/2018   Elevated LDL cholesterol level 07/19/2018   Cough 07/19/2018   Mild reactive airways disease 07/19/2018    PCP:  Tonna Frederic, MD  REFERRING PROVIDER: Isidro Margo, DO  REFERRING DIAG:  Diagnosis  M25.552 (ICD-10-CM) - Left hip pain    THERAPY DIAG:  Pain in left hip  Muscle weakness (generalized)  Stiffness of left hip, not elsewhere classified  Other low back pain  Rationale for Evaluation and Treatment: Rehabilitation  ONSET DATE: Before Christmas  SUBJECTIVE:   SUBJECTIVE STATEMENT: 4/28 pt states the HEP caused pain and discomfort during the second set of the exercises. Put his knee brace on and felt a lot better. Today is the best he has felt in some time.   Eval: Pt came in with left hip pain. Received recent injection for left trochanteric bursitis. Pt states pain and discomfort has been the same since and before the injection. Feels the pain most when walking and often has issues with endurance and balance. Is retired but wants to feel more comfortable ambulating community distances and being on his feet for longer periods of time. Pt states pain at worst is 2/10 and feels like a toothache. Pain is located deep in the hip and sometimes feels spots on the lateral hip.   PERTINENT HISTORY: (2022) LBP with neuro symptoms radiating into bilateral lower legs,  Disc space narrowing is noted throughout the lumbar spine. Mild anterolisthesis of a degenerative nature is noted at L4-5. Multilevel osteophytic changes are seen worst on the left at L1-2. No soft tissue abnormality is noted. PAIN:  Are you having pain? Yes: NPRS scale:  2/10 Pain location: deep Pain description: achy, toothache Aggravating factors: sleeping on left side Relieving factors: rest  PRECAUTIONS: None  RED FLAGS: None   WEIGHT BEARING RESTRICTIONS: No  FALLS:  Has patient fallen in last 6 months? Yes. Number of falls 2 last one was on Monday  LIVING ENVIRONMENT:   OCCUPATION: retired  PLOF: Independent  PATIENT GOALS: Find out what can help him feel better  NEXT MD VISIT:  5/27  OBJECTIVE:  Note: Objective measures were completed at Evaluation unless otherwise noted.  DIAGNOSTIC FINDINGS:  FINDINGS: 2022 Five lumbar type vertebral bodies are well visualized. Vertebral body height is well maintained. Disc space narrowing is noted throughout the lumbar spine. Mild anterolisthesis of a degenerative nature is noted at L4-5. Multilevel osteophytic changes are seen worst on the left at L1-2. No soft tissue abnormality is noted.   IMPRESSION: Multilevel degenerative change with anterolisthesis at L4-5.  PATIENT SURVEYS:  LEFS 49/80  COGNITION: Overall cognitive status: Within functional limits for tasks assessed     SENSATION: WFL  EDEMA:    MUSCLE LENGTH:   POSTURE: rounded shoulders, forward head, and flexed trunk   PALPATION: TTP over lateral hip, IT band, glute region  LOWER EXTREMITY ROM:  Passive ROM Right eval Left eval  Hip flexion  limited  Hip extension    Hip abduction    Hip adduction    Hip internal rotation  limited  Hip external rotation  [facility]  Knee flexion    Knee extension    Ankle dorsiflexion    Ankle plantarflexion    Ankle inversion    Ankle eversion     (Blank rows = not tested) LUMBAR ROM:   Active  A/PROM  eval  Flexion   Extension   Right lateral flexion   Left lateral flexion   Right rotation   Left rotation    (Blank rows = not tested)     LOWER EXTREMITY MMT:  MMT Right eval Left eval  Hip flexion 33.6 33.5  Hip extension    Hip abduction 39.2 27.3  Hip adduction    Hip internal rotation    Hip external rotation    Knee flexion    Knee extension 51.7 34.7  Ankle dorsiflexion    Ankle plantarflexion    Ankle inversion    Ankle eversion     (Blank rows = not tested)  LOWER EXTREMITY SPECIAL TESTS:  Hip special tests: FADIR: negative  FUNCTIONAL TESTS:  Feet together eyes closed: moderate swaying Tandem: moderate swaying  GAIT: 9ft with no AD gait   Moderate trendelenburg  and avoidance                                                                                                                                Treatment Date: 4/28 Manual: All PROM performed with distraction to reduce pain and improve movement STM trigger point therapy to lateral hip region, IT  band There-ex: Nusetp warm up lvl 3 SAQ 2lb RPE 4 Leg ext machine 2x10 25lbs 2 up 1 down RPE 5 Seated abd machine 2x10 55lbs RPE 5 BW mini squats at railing 1x10      4/22 Manual:  Reviewed self soft tissue mobilization in lumbar spine and hip   There-ex: Seated clamshell yellow RTB 2x10 Seated LAQ yellow RTB 2x10 Pt education on exercise grading and progressions    PATIENT EDUCATION:  Education details: HEP, symptom management, self care Person educated: Patient Education method: Explanation, Demonstration, Tactile cues, Verbal cues, and Handouts Education comprehension: verbalized understanding and returned demonstration  HOME EXERCISE PROGRAM: Access Code: [email or web address] URL: [email or web address] Date: 09/30/2023 Prepared by: Lonzell Robin  Exercises - Standing Upper Trapezius Mobilization with Small Ball  - 1 x daily - 7 x weekly - 3 sets - 10 reps - Theracane Under Arm   - 1 x daily - 7 x weekly - 3 sets - 10 reps - Seated Long Arc Quad  - 1 x daily - 7 x weekly - 3 sets - 10 reps - Seated Hip Abduction  - 1 x daily - 7 x weekly - 3 sets - 10 reps  ASSESSMENT:  CLINICAL IMPRESSION: 4/28 Pt warmed up on nustep for with no increase in symptoms. Manual therapy was performed noting mild tightness in lateral hip. Banded LAQ were bothersome last visit, adjustment was made to SAQ with ankle weight which was tolerated better. Exercises were progressed in the gym because pt prefers gym exercises. All exercises were done with cueing for muscle activation for general strengthening. Balance is still an area pt wants to improve. Verbalized  doing BERG next visit to get baseline for balance. Pt will continue to benefit from skilled physical therapy to improve functional strength and balance for ADL's.     Eval: Patient is a 79 y.o. male who was seen today for physical therapy evaluation and treatment for left hip pain. Balance and gait were assessed noting diminished Objective measurements were taken and noted with strength deficits in hip abduction and knee extension. Trigger points were spotted in lateral hip near greater trochanter, IT band, and upper glute region. Pt was educated on at home soft tissue mobilizations with theracane and tennis ball. Exercises were performed for general strengthening. Education was provided on exercises for proper muscle activation and posture. Pt can progress balance, strength, and gait endurance. Pt will continue to benefit from skilled physical therapy to improve community ambulation and functional strength.   OBJECTIVE IMPAIRMENTS: Abnormal gait, decreased activity tolerance, decreased balance, decreased coordination, decreased endurance, decreased mobility, difficulty walking, decreased ROM, decreased strength, hypomobility, and impaired flexibility.   ACTIVITY LIMITATIONS: carrying, lifting, bending, standing, squatting, sleeping, stairs, bathing, toileting, dressing, and locomotion level  PARTICIPATION LIMITATIONS: cleaning, laundry, shopping, community activity, and yard work  PERSONAL FACTORS: 1-2 comorbidities: Lumbar surgery, balance  are also affecting patient's functional outcome.   REHAB POTENTIAL: Good  CLINICAL DECISION MAKING: Evolving/moderate complexity fluctuation in pain levels with ambulation and balance  EVALUATION COMPLEXITY: Moderate   GOALS: Goals reviewed with patient? Yes  SHORT TERM GOALS: Target date: 10/28/2023   Pt will increase hip abduction strength by 5lbs for community ambulation. Baseline: Goal status: INITIAL  2.  Pt will increase knee extension  strength by 5lbs for community ambulation.  Baseline:  Goal status: INITIAL  3.  Pt will be able to complete tandem stance with eyes closed with moderate swaying. Baseline:  Goal status:  INITIAL    LONG TERM GOALS: Target date: 11/25/2023    Pt will increase hip abduction strength by 5lbs for community ambulation. Baseline:  Goal status: INITIAL  2.  Pt will increase knee extension strength by 5lbs for community ambulation.  Baseline:  Goal status: INITIAL  3.  Pt will be able to complete tandem stance with minimal swaying.  Baseline:  Goal status: INITIAL  4.  Pt will be able to ambulate community distances with increased endurance more than . Baseline:  Goal status: INITIAL    PLAN:  PT FREQUENCY: 1-2x/week  PT DURATION: 8 weeks  PLANNED INTERVENTIONS: 97164- PT Re-evaluation, 97110-Therapeutic exercises, 97530- Therapeutic activity, [id number]- Neuromuscular re-education, 97535- Self Care, 10272- Manual therapy, [id number]- Gait training, [phone]- Aquatic Therapy, [phone]- Electrical stimulation (unattended), [id number]- Electrical stimulation (manual), and 97012- Traction (mechanical)  PLAN FOR NEXT SESSION: Expand HEP, work on balance and gait training. Perform BERG.   Katheran Palms, Student-PT 10/05/2023, 9:39 AM  Katheran Palms SPT   During this treatment session, the therapist was present, participating in and directing the treatment.   I have reviewed and concur with this student's documentation.

## 2023-10-07 ENCOUNTER — Ambulatory Visit (HOSPITAL_BASED_OUTPATIENT_CLINIC_OR_DEPARTMENT_OTHER): Admitting: Physical Therapy

## 2023-10-07 ENCOUNTER — Encounter (HOSPITAL_BASED_OUTPATIENT_CLINIC_OR_DEPARTMENT_OTHER): Payer: Self-pay | Admitting: Physical Therapy

## 2023-10-07 DIAGNOSIS — M6281 Muscle weakness (generalized): Secondary | ICD-10-CM

## 2023-10-07 DIAGNOSIS — M25552 Pain in left hip: Secondary | ICD-10-CM | POA: Diagnosis not present

## 2023-10-07 DIAGNOSIS — M5459 Other low back pain: Secondary | ICD-10-CM | POA: Diagnosis not present

## 2023-10-07 DIAGNOSIS — M25652 Stiffness of left hip, not elsewhere classified: Secondary | ICD-10-CM | POA: Diagnosis not present

## 2023-10-07 NOTE — Therapy (Signed)
 OUTPATIENT PHYSICAL THERAPY LOWER EXTREMITY EVALUATION   Patient Name: Jonathon Johnson MRN: [id number] DOB:[birth year], 78 y.o., male Today's Date: 10/07/2023  END OF SESSION:  PT End of Session - 10/07/23 0841     Visit Number 3    Number of Visits 16    Date for PT Re-Evaluation 11/25/23    PT Start Time 0840    PT Stop Time 0922    PT Time Calculation (min) 42 min    Activity Tolerance Patient tolerated treatment well;No increased pain    Behavior During Therapy [facility] for tasks assessed/performed               Past Medical History:  Diagnosis Date   Asthma    Chicken pox    GERD (gastroesophageal reflux disease)    Hyperlipidemia 11/04/2016   Hypertension 07/19/2018   Past Surgical History:  Procedure Laterality Date   CATARACT EXTRACTION Bilateral    02/2023   CHOLECYSTECTOMY     TRANSFORAMINAL LUMBAR INTERBODY FUSION (TLIF) WITH PEDICLE SCREW FIXATION 1 LEVEL Right 09/25/2021   Procedure: RIGHT-SIDED TRANSFORAMINAL LUMBAR INTERBODY FUSION AND DECOMPRESSION LUMBAR 4-  LUMBAR 5 WITH INSTRUMENTATION AND ALLOGRAFT;  Surgeon: Virl Grimes, MD;  Location: [facility] OR;  Service: Orthopedics;  Laterality: Right;   Patient Active Problem List   Diagnosis Date Noted   Arthritis of left hip 09/01/2023   Greater trochanteric bursitis of left hip 09/01/2023   Hyponatremia 08/07/2023   Radiculopathy 09/25/2021   Post herpetic neuralgia 11/08/2020   Chronic bilateral low back pain with bilateral sciatica 08/28/2020   Alcohol  use 03/16/2020   Thumb pain, right 06/28/2019   Arthralgia of right temporomandibular joint 12/20/2018   Diarrhea 12/20/2018   Screening for diabetes mellitus 07/19/2018   Insomnia 07/19/2018   Gastroesophageal reflux disease 07/19/2018   Benign prostatic hyperplasia with urinary hesitancy 07/19/2018   Essential hypertension 07/19/2018   Elevated LDL cholesterol level 07/19/2018   Cough 07/19/2018   Mild reactive airways disease 07/19/2018    PCP:  Tonna Frederic, MD  REFERRING PROVIDER: Isidro Margo, DO  REFERRING DIAG:  Diagnosis  M25.552 (ICD-10-CM) - Left hip pain    THERAPY DIAG:  Pain in left hip  Muscle weakness (generalized)  Rationale for Evaluation and Treatment: Rehabilitation  ONSET DATE: Before Christmas  SUBJECTIVE:   SUBJECTIVE STATEMENT: Not quite as bad but not quite pain free. I woke up on my left side and had to roll over due to Lt hip pain. Bike 15 min & abd/add machine- less weight tolerance with add. Herminia Lope told me not to do curl machine. The walk from here to car can be irritable.   Eval: Pt came in with left hip pain. Received recent injection for left trochanteric bursitis. Pt states pain and discomfort has been the same since and before the injection. Feels the pain most when walking and often has issues with endurance and balance. Is retired but wants to feel more comfortable ambulating community distances and being on his feet for longer periods of time. Pt states pain at worst is 2/10 and feels like a toothache. Pain is located deep in the hip and sometimes feels spots on the lateral hip.   PERTINENT HISTORY: (2022) LBP with neuro symptoms radiating into bilateral lower legs,  Disc space narrowing is noted throughout the lumbar spine. Mild anterolisthesis of a degenerative nature is noted at L4-5. Multilevel osteophytic changes are seen worst on the left at L1-2. No soft tissue abnormality is noted. PAIN:  Are  you having pain? Yes: NPRS scale: 2/10 Pain location: deep Pain description: achy, toothache Aggravating factors: sleeping on left side Relieving factors: rest  PRECAUTIONS: None  RED FLAGS: None   WEIGHT BEARING RESTRICTIONS: No  FALLS:  Has patient fallen in last 6 months? Yes. Number of falls 2 last one was on Monday  LIVING ENVIRONMENT:   OCCUPATION: retired  PLOF: Independent  PATIENT GOALS: Find out what can help him feel better  NEXT MD VISIT:  5/27  OBJECTIVE:  Note: Objective measures were completed at Evaluation unless otherwise noted.  DIAGNOSTIC FINDINGS:  FINDINGS: 2022 Five lumbar type vertebral bodies are well visualized. Vertebral body height is well maintained. Disc space narrowing is noted throughout the lumbar spine. Mild anterolisthesis of a degenerative nature is noted at L4-5. Multilevel osteophytic changes are seen worst on the left at L1-2. No soft tissue abnormality is noted.   IMPRESSION: Multilevel degenerative change with anterolisthesis at L4-5.  PATIENT SURVEYS:  LEFS 49/80  COGNITION: Overall cognitive status: Within functional limits for tasks assessed     SENSATION: WFL  EDEMA:    MUSCLE LENGTH:   POSTURE: rounded shoulders, forward head, and flexed trunk   PALPATION: TTP over lateral hip, IT band, glute region  LOWER EXTREMITY ROM:  Passive ROM Right eval Left eval  Hip flexion  limited  Hip extension    Hip abduction    Hip adduction    Hip internal rotation  limited  Hip external rotation  [facility]  Knee flexion    Knee extension    Ankle dorsiflexion    Ankle plantarflexion    Ankle inversion    Ankle eversion     (Blank rows = not tested) LUMBAR ROM:   Active  A/PROM  eval  Flexion   Extension   Right lateral flexion   Left lateral flexion   Right rotation   Left rotation    (Blank rows = not tested)     LOWER EXTREMITY MMT:  MMT Right eval Left eval  Hip flexion 33.6 33.5  Hip extension    Hip abduction 39.2 27.3  Hip adduction    Hip internal rotation    Hip external rotation    Knee flexion    Knee extension 51.7 34.7  Ankle dorsiflexion    Ankle plantarflexion    Ankle inversion    Ankle eversion     (Blank rows = not tested)  LOWER EXTREMITY SPECIAL TESTS:  Hip special tests: FADIR: negative  FUNCTIONAL TESTS:  Feet together eyes closed: moderate swaying Tandem: moderate swaying  GAIT: 34ft with no AD gait   Moderate trendelenburg  and avoidance                                                                                                                                Treatment Date: Treatment  4/30: Blank lines following charge title = not provided on this treatment date.   Manual:  TPDN YES Trigger Point Dry Needling  Subsequent Treatment: Instructions provided previously at initial dry needling treatment. - previously needled in [facility] system  Patient Verbal Consent Given: Yes Education Handout Provided: Previously Provided Muscles Treated: Lt Glut med/min, TFL Electrical Stimulation Performed: No Treatment Response/Outcome: twitch with decreased concordant pain  There-ex: Supine piriformis stretch Sidelying clam Sidelying hip circles Seated figure 4 Standing quad stretch- foot on table, hands on chair SLS with toe prop There-Act:  Self Care:  Nuro-Re-ed:  Gait Training: Glut set at toe off to decrease trunk flexion in Lt stance phase   4/28 Manual: All PROM performed with distraction to reduce pain and improve movement STM trigger point therapy to lateral hip region, IT band There-ex: Nusetp warm up lvl 3 SAQ 2lb RPE 4 Leg ext machine 2x10 25lbs 2 up 1 down RPE 5 Seated abd machine 2x10 55lbs RPE 5 BW mini squats at railing 1x10      4/22 Manual:  Reviewed self soft tissue mobilization in lumbar spine and hip   There-ex: Seated clamshell yellow RTB 2x10 Seated LAQ yellow RTB 2x10 Pt education on exercise grading and progressions    PATIENT EDUCATION:  Education details: HEP, symptom management, self care Person educated: Patient Education method: Explanation, Demonstration, Tactile cues, Verbal cues, and Handouts Education comprehension: verbalized understanding and returned demonstration  HOME EXERCISE PROGRAM: Access Code: [email or web address] URL: [email or web address] Date: 09/30/2023 Prepared by: Lonzell Robin  Exercises - Standing Upper Trapezius Mobilization with Small Ball  - 1 x daily - 7 x weekly - 3 sets - 10 reps - Theracane Under Arm   - 1 x daily - 7 x weekly - 3 sets - 10 reps - Seated Long Arc Quad  - 1 x daily - 7 x weekly - 3 sets - 10 reps - Seated Hip Abduction  - 1 x daily - 7 x weekly - 3 sets - 10 reps  ASSESSMENT:  CLINICAL IMPRESSION: Excellent tolerance to treatment today with decreased forward trunk in Lt stance phase. Encouraged return to short walks for awareness of patterns & moving from abd/add machines to sidelying exercises. Did not get to [facility] testing today but notable fear of balance- discussed ankle strategy paired with core/glut activation.     Eval: Patient is a 78 y.o. male who was seen today for physical therapy evaluation and treatment for left hip pain. Balance and gait were assessed noting diminished Objective measurements were taken and noted with strength deficits in hip abduction and knee extension. Trigger points were spotted in lateral hip near greater trochanter, IT band, and upper glute region. Pt was educated on at home soft tissue mobilizations with theracane and tennis ball. Exercises were performed for general strengthening. Education was provided on exercises for proper muscle activation and posture. Pt can progress balance, strength, and gait endurance. Pt will continue to benefit from skilled physical therapy to improve community ambulation and functional strength.   OBJECTIVE IMPAIRMENTS: Abnormal gait, decreased activity tolerance, decreased balance, decreased coordination, decreased endurance, decreased mobility, difficulty walking, decreased ROM, decreased strength, hypomobility, and impaired flexibility.   ACTIVITY LIMITATIONS: carrying, lifting, bending, standing, squatting, sleeping, stairs, bathing, toileting, dressing, and locomotion level  PARTICIPATION LIMITATIONS: cleaning, laundry, shopping, community activity, and yard  work  PERSONAL FACTORS: 1-2 comorbidities: Lumbar surgery, balance  are also affecting patient's functional outcome.   REHAB POTENTIAL: Good  CLINICAL DECISION MAKING: Evolving/moderate complexity fluctuation in pain levels with ambulation and balance  EVALUATION COMPLEXITY: Moderate   GOALS: Goals reviewed with patient? Yes  SHORT TERM GOALS: Target date: 10/28/2023   Pt will increase hip abduction strength by 5lbs for community ambulation. Baseline: Goal status: INITIAL  2.  Pt will increase knee extension strength by 5lbs for community ambulation.  Baseline:  Goal status: INITIAL  3.  Pt will be able to complete tandem stance with eyes closed with moderate swaying. Baseline:  Goal status: INITIAL    LONG TERM GOALS: Target date: 11/25/2023    Pt will increase hip abduction strength by 5lbs for community ambulation. Baseline:  Goal status: INITIAL  2.  Pt will increase knee extension strength by 5lbs for community ambulation.  Baseline:  Goal status: INITIAL  3.  Pt will be able to complete tandem stance with minimal swaying.  Baseline:  Goal status: INITIAL  4.  Pt will be able to ambulate community distances with increased endurance more than . Baseline:  Goal status: INITIAL    PLAN:  PT FREQUENCY: 1-2x/week  PT DURATION: 8 weeks  PLANNED INTERVENTIONS: 97164- PT Re-evaluation, 97110-Therapeutic exercises, 97530- Therapeutic activity, [id number]- Neuromuscular re-education, 97535- Self Care, [id number]- Manual therapy, [id number]- Gait training, [phone]- Aquatic Therapy, [phone]- Electrical stimulation (unattended), [id number]- Electrical stimulation (manual), and 97012- Traction (mechanical)  PLAN FOR NEXT SESSION: Expand HEP, work on balance and gait training. Perform BERG.   Tishawn Friedhoff C. Refugio Vandevoorde PT, DPT 10/07/23 9:25 AM

## 2023-10-14 ENCOUNTER — Ambulatory Visit: Attending: Cardiology | Admitting: Cardiology

## 2023-10-14 VITALS — BP 160/90 | HR 88 | Ht 66.5 in | Wt 176.0 lb

## 2023-10-14 DIAGNOSIS — E785 Hyperlipidemia, unspecified: Secondary | ICD-10-CM | POA: Diagnosis not present

## 2023-10-14 DIAGNOSIS — I2510 Atherosclerotic heart disease of native coronary artery without angina pectoris: Secondary | ICD-10-CM

## 2023-10-14 DIAGNOSIS — R002 Palpitations: Secondary | ICD-10-CM

## 2023-10-14 DIAGNOSIS — I10 Essential (primary) hypertension: Secondary | ICD-10-CM

## 2023-10-14 MED ORDER — ROSUVASTATIN CALCIUM 20 MG PO TABS: 20.0000 mg | ORAL_TABLET | Freq: Every day | ORAL | 3 refills | Status: AC

## 2023-10-14 MED ORDER — CARVEDILOL 25 MG PO TABS: 25.0000 mg | ORAL_TABLET | Freq: Two times a day (BID) | ORAL | 3 refills | Status: AC

## 2023-10-14 NOTE — Patient Instructions (Signed)
 Medication  Instructions:  - Start COREG  25 MG BY MOUTH TWICE DAILY.    *If you need a refill on your cardiac medications before your next appointment, please call your pharmacy*   Lab Work: NONE    If you have labs (blood work) drawn today and your tests are completely normal, you will receive your results only by: MyChart Message (if you have MyChart) OR A paper copy in the mail If you have any lab test that is abnormal or we need to change your treatment, we will call you to review the results.   Testing/Procedures: NONE    Follow-Up: At [facility], you and your health needs are our priority.  As part of our continuing mission to provide you with exceptional heart care, we have created designated Provider Care Teams.  These Care Teams include your primary Cardiologist (physician) and Advanced Practice Providers (APPs -  Physician Assistants and Nurse Practitioners) who all work together to provide you with the care you need, when you need it.  We recommend signing up for the patient portal called "MyChart".  Sign up information is provided on this After Visit Summary.  MyChart is used to connect with patients for Virtual Visits (Telemedicine).  Patients are able to view lab/test results, encounter notes, upcoming appointments, etc.  Non-urgent messages can be sent to your provider as well.   To learn more about what you can do with MyChart, go to [email or web address].    Your next appointment:   6 month(s)  The format for your next appointment:   In Person  Provider:   Wendie Hamburg, MD   Other Instructions   DR. SCHUMANN WOULD LIKE YOU TO CHECK YOUR BLOOD PRESSURE DAILY FOR THE NEXT TWO WEEKS, KEEP A LOG OF THE READINGS AND SEND THEM INTO OUR OFFICE.

## 2023-10-14 NOTE — Progress Notes (Signed)
 Cardiology Office Note:    Date:  10/14/2023   ID:  Jonathon Johnson, DOB [birth year], MRN [id number]  PCP:  Tonna Frederic, MD  Cardiologist:  Wendie Hamburg, MD  Electrophysiologist:  None   Referring MD: Tonna Frederic,*   Chief Complaint  Patient presents with   Coronary Artery Disease     History of Present Illness:    Jonathon Johnson is a 78 y.o. male with a hx of CAD, asthma, hypertension, hyperlipidemia, GERD who presents for follow-up. He was referred by Dr. Tilmon Font for evaluation of tachycardia, initially seen on 12/15/2019. He was seen in the ED on 12/07/2019 for tachycardia.  He reported having palpitations and feeling like his heart was racing.  Checked his pulse and it was in the 130s.  In the ED, pulse remained in 120s, sinus tachycardia.  Lab work showed normal electrolytes,TSH, and negative troponin x2.  Age-adjusted D-dimer was negative.  He was given IV fluids, p.o. Ativan  and IV metoprolol .  Heart rate improved to 80s to 90s.  Echocardiogram on 01/04/2020 showed normal biventricular function, no significant valvular disease. Zio patch x7 days on 01/12/2020 showed 23 episodes of SVT, longest lasting 17 beats. Patient triggered events corresponded to sinus rhythm  PACs/PVCs.  Calcium  score 1212 (81st percentile) on 05/17/2021.  Stress PET 09/24/2022 showed normal perfusion, mildly abnormal myocardial blood flow reserve (1.75; had normal stress flows but high resting flows).  Since last clinic visit, reports that he is doing okay.  He has been walking regularly.  Denies any exertional symptoms but reports he is been having hip pain and may need hip replacement surgery.  Starting with physical therapy.  He denies any chest pain, dyspnea, lower extremity edema.  Reports rare lightheadedness with standing.  States that palpitations have improved, occurring rarely.  Continues to drink 3 to 4 glasses of wine per night.   BP Readings from Last 3 Encounters:  10/14/23  (!) 160/90  09/02/23 (!) 157/94  09/01/23 118/70     Past Medical History:  Diagnosis Date   Asthma    Chicken pox    GERD (gastroesophageal reflux disease)    Hyperlipidemia 11/04/2016   Hypertension 07/19/2018    Past Surgical History:  Procedure Laterality Date   CATARACT EXTRACTION Bilateral    02/2023   CHOLECYSTECTOMY     TRANSFORAMINAL LUMBAR INTERBODY FUSION (TLIF) WITH PEDICLE SCREW FIXATION 1 LEVEL Right 09/25/2021   Procedure: RIGHT-SIDED TRANSFORAMINAL LUMBAR INTERBODY FUSION AND DECOMPRESSION LUMBAR 4-  LUMBAR 5 WITH INSTRUMENTATION AND ALLOGRAFT;  Surgeon: Virl Grimes, MD;  Location: [facility] OR;  Service: Orthopedics;  Laterality: Right;    Current Medications: Current Meds  Medication Sig   albuterol  (VENTOLIN  HFA) 108 (90 Base) MCG/ACT inhaler Inhale 2 puffs into the lungs every 6 (six) hours as needed for wheezing or shortness of breath.   amitriptyline  (ELAVIL ) 25 MG tablet Take 25 mg by mouth at bedtime.   aspirin EC 81 MG tablet Take 81 mg by mouth daily. Swallow whole.   atropine 1 % ophthalmic solution Place 1 drop into the right eye daily.   finasteride  (PROSCAR ) 5 MG tablet TAKE 1 TABLET DAILY   fluticasone  (FLONASE ) 50 MCG/ACT nasal spray Place 2 sprays into both nostrils daily.   ibuprofen  (ADVIL ) 600 MG tablet Take 600 mg by mouth every 6 (six) hours as needed.   methocarbamol  (ROBAXIN ) 500 MG tablet Take by mouth every 6 (six) hours as needed.   Multiple Vitamin (MULTIVITAMIN) tablet Take 1 tablet by  mouth daily.   omeprazole  (PRILOSEC) 40 MG capsule TAKE 1 CAPSULE DAILY   Study - VICTORION-1 PREVENT - inclisiran 300 mg/1.20mL or placebo SQ injection (PI-Stuckey) Inject 300 mg into the skin every 6 (six) months. For Investigational Use Only. Inject 1.5 mL subcutaneously into the abdomen, upper arm or thigh. Do not inject into areas of active skin disease, such as sunburns, skin rashes, inflammation, or skin infections. Administered on day 1, day 90, and  every 6 months thereafter. Please contact [facility] for any questions or concerns regarding this medication.   tamsulosin  (FLOMAX ) 0.4 MG CAPS capsule TAKE 2 CAPSULES DAILY AFTER BREAKFAST   valsartan  (DIOVAN ) 160 MG tablet TAKE 1 TABLET DAILY   WIXELA INHUB 250-50 MCG/ACT AEPB USE 1 INHALATION TWICE A DAY   [DISCONTINUED] carvedilol  (COREG ) 12.5 MG tablet TAKE 1 TABLET TWICE A DAY WITH MEALS   [DISCONTINUED] rosuvastatin  (CRESTOR ) 20 MG tablet TAKE 1 TABLET DAILY     Allergies:   Amoxicillin and Morphine and codeine   Social History   Socioeconomic History   Marital status: Married    Spouse name: Not on file   Number of children: Not on file   Years of education: Not on file   Highest education level: Some college, no degree  Occupational History   Not on file  Tobacco Use   Smoking status: Former    Current packs/day: 0.00    Types: Cigarettes    Quit date: 07/19/2010    Years since quitting: 13.2   Smokeless tobacco: Never  Vaping Use   Vaping status: Never Used  Substance and Sexual Activity   Alcohol  use: Yes    Alcohol /week: 14.0 standard drinks of alcohol     Types: 14 Glasses of wine per week    Comment: 3-4 glasses of wine daily   Drug use: Never   Sexual activity: Yes    Partners: Female  Other Topics Concern   Not on file  Social History Narrative   Right handed   Social Drivers of Health   Financial Resource Strain: Low Risk  (08/07/2023)   Overall Financial Resource Strain (CARDIA)    Difficulty of Paying Living Expenses: Not hard at all  Food Insecurity: No Food Insecurity (08/07/2023)   Hunger Vital Sign    Worried About Running Out of Food in the Last Year: Never true    Ran Out of Food in the Last Year: Never true  Transportation Needs: No Transportation Needs (08/07/2023)   PRAPARE - Administrator, Civil Service (Medical): No    Lack of Transportation (Non-Medical): No  Physical Activity: Sufficiently  Active (08/07/2023)   Exercise Vital Sign    Days of Exercise per Week: 3 days    Minutes of Exercise per Session: 50 min  Recent Concern: Physical Activity - Insufficiently Active (08/02/2023)   Exercise Vital Sign    Days of Exercise per Week: 3 days    Minutes of Exercise per Session: 40 min  Stress: No Stress Concern Present (08/07/2023)   [name] Stress Questionnaire    Feeling of Stress : Not at all  Social Connections: Moderately Integrated (08/07/2023)   Social Connection and Isolation Panel [NHANES]    Frequency of Communication with Friends and Family: Three times a week    Frequency of Social Gatherings with Friends and Family: Once a week    Attends Religious Services: More than 4 times per year    Active Member  of Clubs or Organizations: No    Attends Banker Meetings: Never    Marital Status: Married  Recent Concern: Social Connections - Moderately Isolated (08/02/2023)   Social Connection and Isolation Panel [NHANES]    Frequency of Communication with Friends and Family: Once a week    Frequency of Social Gatherings with Friends and Family: Once a week    Attends Religious Services: More than 4 times per year    Active Member of [employer] or Organizations: No    Attends Engineer, structural: Not on file    Marital Status: Married     Family History: The patient's family history includes Alcohol  abuse in his father and mother; COPD in his father; Cancer in his sister; Depression in his mother; Diabetes in his mother; Hypertension in his brother; Migraines in his daughter; Multiple sclerosis in his daughter.  ROS:   Please see the history of present illness.     All other systems reviewed and are negative.  EKGs/Labs/Other Studies Reviewed:    The following studies were reviewed today:   EKG:   04/18/21: Normal sinus rhythm, rate 87, no ST abnormality 08/19/2022: Sinus rhythm with PACs, low voltage,  rate 79 02/19/2023: Sinus rhythm with PACs, left axis deviation, rate 73    Recent Labs: 08/06/2023: BUN 15; Creatinine, Ser 0.93; Potassium 4.7; Sodium 131  Recent Lipid Panel    Component Value Date/Time   CHOL 166 02/03/2023 0846   TRIG 74.0 02/03/2023 0846   HDL 76.10 02/03/2023 0846   CHOLHDL 2 02/03/2023 0846   VLDL 14.8 02/03/2023 0846   LDLCALC 76 02/03/2023 0846   LDLDIRECT 73.0 08/06/2023 0852    Physical Exam:    VS:  BP (!) 160/90 (BP Location: Right Arm)   Pulse 88   Ht 5' 6.5" (1.689 m)   Wt 176 lb (79.8 kg)   SpO2 95%   BMI 27.98 kg/m     Wt Readings from Last 3 Encounters:  10/14/23 176 lb (79.8 kg)  09/02/23 174 lb (78.9 kg)  09/01/23 178 lb (80.7 kg)     GEN:  in no acute distress HEENT: Normal NECK: No JVD; No carotid bruits CARDIAC: RRR, no murmurs, rubs, gallops RESPIRATORY:  Clear to auscultation without rales, wheezing or rhonchi  ABDOMEN: Soft, non-tender, non-distended MUSCULOSKELETAL:  No edema; No deformity  SKIN: Warm and dry NEUROLOGIC:  Alert and oriented x 3 PSYCHIATRIC:  Normal affect   ASSESSMENT:    1. Coronary artery calcification   2. Palpitations   3. Essential hypertension   4. Hyperlipidemia, unspecified hyperlipidemia type     PLAN:    CAD: Calcium  score 1212 (81st percentile) on 05/17/2021.  Denies any chest pain but reports dyspnea on exertion.  Stress PET 09/24/2022 showed normal perfusion, mildly abnormal myocardial blood flow reserve (1.75; had normal stress flows but high resting flows). -Continue aspirin 81 mg daily -Participating in [place] study, on inclisiran versus placebo, in addition to rosuvastatin  20 mg daily  Tachycardia/palpitations/lightheadedness: Description concerning for arrhythmia.  Echocardiogram on 01/04/2020 showed normal biventricular function, no significant valvular disease. Zio patch x7 days on 01/12/2020 showed 23 episodes of SVT, longest lasting 17 beats. Patient triggered events  corresponded to sinus rhythm  PACs/PVCs.  Suspect alcohol  use (4 glasses of wine per night) could be contributing, recommended cutting back on alcohol  intake.  He denies any history of withdrawal symptoms.  Continue carvedilol , will increase to 25 mg daily.  He reports palpitations have improved.  Hypertension:  On valsartan  160 mg daily and carvedilol  12.5 mg twice daily.  BP elevated, recommend increasing carvedilol  to 25 mg twice daily.  Asked to check BP daily for next 2 weeks and let us  know results.  Hyperlipidemia: 10-year ASCVD risk score 21%. LDL 108 on 11/29/2019.  Started rosuvastatin  10 mg daily, LDL 91 on 09/28/2020.  Calcium  score 1212 (81st percentile) on 05/17/2021.  Participating in [place] study, on inclisiran versus placebo, in addition to rosuvastatin  20 mg daily.  LDL 73 on 08/06/23  RTC in 6 months    Medication Adjustments/Labs and Tests Ordered: Current medicines are reviewed at length with the patient today.  Concerns regarding medicines are outlined above.  No orders of the defined types were placed in this encounter.   Meds ordered this encounter  Medications   rosuvastatin  (CRESTOR ) 20 MG tablet    Sig: Take 1 tablet (20 mg total) by mouth daily.    Dispense:  90 tablet    Refill:  3   carvedilol  (COREG ) 25 MG tablet    Sig: Take 1 tablet (25 mg total) by mouth 2 (two) times daily with a meal.    Dispense:  180 tablet    Refill:  3    Please keep upcoming appointment for additional refills     Patient Instructions  Medication  Instructions:  - Start COREG  25 MG BY MOUTH TWICE DAILY.    *If you need a refill on your cardiac medications before your next appointment, please call your pharmacy*   Lab Work: NONE    If you have labs (blood work) drawn today and your tests are completely normal, you will receive your results only by: MyChart Message (if you have MyChart) OR A paper copy in the mail If you have any lab test that is abnormal or we need to  change your treatment, we will call you to review the results.   Testing/Procedures: NONE    Follow-Up: At [facility], you and your health needs are our priority.  As part of our continuing mission to provide you with exceptional heart care, we have created designated Provider Care Teams.  These Care Teams include your primary Cardiologist (physician) and Advanced Practice Providers (APPs -  Physician Assistants and Nurse Practitioners) who all work together to provide you with the care you need, when you need it.  We recommend signing up for the patient portal called "MyChart".  Sign up information is provided on this After Visit Summary.  MyChart is used to connect with patients for Virtual Visits (Telemedicine).  Patients are able to view lab/test results, encounter notes, upcoming appointments, etc.  Non-urgent messages can be sent to your provider as well.   To learn more about what you can do with MyChart, go to [email or web address].    Your next appointment:   6 month(s)  The format for your next appointment:   In Person  Provider:   Wendie Hamburg, MD   Other Instructions   DR. Trevion Hoben WOULD LIKE YOU TO CHECK YOUR BLOOD PRESSURE DAILY FOR THE NEXT TWO WEEKS, KEEP A LOG OF THE READINGS AND SEND THEM INTO OUR OFFICE.    Signed, Wendie Hamburg, MD  10/14/2023 3:59 PM    [facility]

## 2023-10-15 ENCOUNTER — Ambulatory Visit (HOSPITAL_BASED_OUTPATIENT_CLINIC_OR_DEPARTMENT_OTHER): Payer: Self-pay | Attending: Family Medicine | Admitting: Physical Therapy

## 2023-10-15 ENCOUNTER — Encounter: Payer: Self-pay | Admitting: Cardiology

## 2023-10-15 ENCOUNTER — Encounter (HOSPITAL_BASED_OUTPATIENT_CLINIC_OR_DEPARTMENT_OTHER): Payer: Self-pay | Admitting: Physical Therapy

## 2023-10-15 DIAGNOSIS — M5459 Other low back pain: Secondary | ICD-10-CM | POA: Diagnosis not present

## 2023-10-15 DIAGNOSIS — M25652 Stiffness of left hip, not elsewhere classified: Secondary | ICD-10-CM | POA: Insufficient documentation

## 2023-10-15 DIAGNOSIS — M25552 Pain in left hip: Secondary | ICD-10-CM | POA: Diagnosis not present

## 2023-10-15 DIAGNOSIS — M6281 Muscle weakness (generalized): Secondary | ICD-10-CM | POA: Insufficient documentation

## 2023-10-15 NOTE — Therapy (Signed)
 OUTPATIENT PHYSICAL THERAPY LOWER EXTREMITY EVALUATION   Patient Name: Jonathon Johnson MRN: [id number] DOB:28-Sep-1945, 78 y.o., male Today's Date: 10/16/2023  END OF SESSION:  PT End of Session - 10/15/23 1228     Visit Number 4    Number of Visits 16    Date for PT Re-Evaluation 11/25/23    PT Start Time 1230    PT Stop Time 1314    PT Time Calculation (min) 44 min    Activity Tolerance Patient tolerated treatment well;No increased pain    Behavior During Therapy [facility] for tasks assessed/performed                Past Medical History:  Diagnosis Date   Asthma    Chicken pox    GERD (gastroesophageal reflux disease)    Hyperlipidemia 11/04/2016   Hypertension 07/19/2018   Past Surgical History:  Procedure Laterality Date   CATARACT EXTRACTION Bilateral    02/2023   CHOLECYSTECTOMY     TRANSFORAMINAL LUMBAR INTERBODY FUSION (TLIF) WITH PEDICLE SCREW FIXATION 1 LEVEL Right 09/25/2021   Procedure: RIGHT-SIDED TRANSFORAMINAL LUMBAR INTERBODY FUSION AND DECOMPRESSION LUMBAR 4-  LUMBAR 5 WITH INSTRUMENTATION AND ALLOGRAFT;  Surgeon: Virl Grimes, MD;  Location: [facility] OR;  Service: Orthopedics;  Laterality: Right;   Patient Active Problem List   Diagnosis Date Noted   Arthritis of left hip 09/01/2023   Greater trochanteric bursitis of left hip 09/01/2023   Hyponatremia 08/07/2023   Radiculopathy 09/25/2021   Post herpetic neuralgia 11/08/2020   Chronic bilateral low back pain with bilateral sciatica 08/28/2020   Alcohol  use 03/16/2020   Thumb pain, right 06/28/2019   Arthralgia of right temporomandibular joint 12/20/2018   Diarrhea 12/20/2018   Screening for diabetes mellitus 07/19/2018   Insomnia 07/19/2018   Gastroesophageal reflux disease 07/19/2018   Benign prostatic hyperplasia with urinary hesitancy 07/19/2018   Essential hypertension 07/19/2018   Elevated LDL cholesterol level 07/19/2018   Cough 07/19/2018   Mild reactive airways disease 07/19/2018    PCP:  Tonna Frederic, MD  REFERRING PROVIDER: Isidro Margo, DO  REFERRING DIAG:  Diagnosis  M25.552 (ICD-10-CM) - Left hip pain    THERAPY DIAG:  Pain in left hip  Muscle weakness (generalized)  Stiffness of left hip, not elsewhere classified  Other low back pain  Rationale for Evaluation and Treatment: Rehabilitation  ONSET DATE: Before Christmas  SUBJECTIVE:   SUBJECTIVE STATEMENT: 5/8 Pt reports he feels fantastic after last visit. Loved the dry needling and enjoyed the sidelying exercises.   Eval: Pt came in with left hip pain. Received recent injection for left trochanteric bursitis. Pt states pain and discomfort has been the same since and before the injection. Feels the pain most when walking and often has issues with endurance and balance. Is retired but wants to feel more comfortable ambulating community distances and being on his feet for longer periods of time. Pt states pain at worst is 2/10 and feels like a toothache. Pain is located deep in the hip and sometimes feels spots on the lateral hip.   PERTINENT HISTORY: (2022) LBP with neuro symptoms radiating into bilateral lower legs,  Disc space narrowing is noted throughout the lumbar spine. Mild anterolisthesis of a degenerative nature is noted at L4-5. Multilevel osteophytic changes are seen worst on the left at L1-2. No soft tissue abnormality is noted. PAIN:  Are you having pain? Yes: NPRS scale: 2/10 Pain location: deep Pain description: achy, toothache Aggravating factors: sleeping on left side Relieving factors:  rest  PRECAUTIONS: None  RED FLAGS: None   WEIGHT BEARING RESTRICTIONS: No  FALLS:  Has patient fallen in last 6 months? Yes. Number of falls 2 last one was on Monday  LIVING ENVIRONMENT:   OCCUPATION: retired  PLOF: Independent  PATIENT GOALS: Find out what can help him feel better  NEXT MD VISIT: 5/27  OBJECTIVE:  Note: Objective measures were completed at Evaluation  unless otherwise noted.  DIAGNOSTIC FINDINGS:  FINDINGS: 2022 Five lumbar type vertebral bodies are well visualized. Vertebral body height is well maintained. Disc space narrowing is noted throughout the lumbar spine. Mild anterolisthesis of a degenerative nature is noted at L4-5. Multilevel osteophytic changes are seen worst on the left at L1-2. No soft tissue abnormality is noted.   IMPRESSION: Multilevel degenerative change with anterolisthesis at L4-5.  PATIENT SURVEYS:  LEFS 49/80  COGNITION: Overall cognitive status: Within functional limits for tasks assessed     SENSATION: WFL  EDEMA:    MUSCLE LENGTH:   POSTURE: rounded shoulders, forward head, and flexed trunk   PALPATION: TTP over lateral hip, IT band, glute region  LOWER EXTREMITY ROM:  Passive ROM Right eval Left eval  Hip flexion  limited  Hip extension    Hip abduction    Hip adduction    Hip internal rotation  limited  Hip external rotation  [facility]  Knee flexion    Knee extension    Ankle dorsiflexion    Ankle plantarflexion    Ankle inversion    Ankle eversion     (Blank rows = not tested) LUMBAR ROM:   Active  A/PROM  eval  Flexion   Extension   Right lateral flexion   Left lateral flexion   Right rotation   Left rotation    (Blank rows = not tested)     LOWER EXTREMITY MMT:  MMT Right eval Left eval  Hip flexion 33.6 33.5  Hip extension    Hip abduction 39.2 27.3  Hip adduction    Hip internal rotation    Hip external rotation    Knee flexion    Knee extension 51.7 34.7  Ankle dorsiflexion    Ankle plantarflexion    Ankle inversion    Ankle eversion     (Blank rows = not tested)  LOWER EXTREMITY SPECIAL TESTS:  Hip special tests: FADIR: negative  FUNCTIONAL TESTS:  Feet together eyes closed: moderate swaying Tandem: moderate swaying  GAIT: 68ft with no AD gait   Moderate trendelenburg and avoidance                                                                                                                                 Treatment Date: 5/8 Manual: All PROM performed with distraction to reduce pain and improve movement Trigger Point Dry Needling  Subsequent Treatment: Instructions reviewed, if requested by the patient, prior to subsequent dry needling treatment.   Patient Verbal Consent  Given: Yes Education Handout Provided: Previously Provided Muscles Treated: R glute 3x using a .30x50 needle Electrical Stimulation Performed: No Treatment Response/Outcome: strong muscle twitch response   There-ex: Nustep lvl 3 Leg ext 3x10 25lbs Leg press 3x10 40lbs  Cybex row machine 3x10 50lbs  Figure 4 stretch 3x30sec      Treatment                            4/30: Blank lines following charge title = not provided on this treatment date.   Manual:  TPDN YES Trigger Point Dry Needling  Subsequent Treatment: Instructions provided previously at initial dry needling treatment. - previously needled in [facility] system  Patient Verbal Consent Given: Yes Education Handout Provided: Previously Provided Muscles Treated: Lt Glut med/min, TFL Electrical Stimulation Performed: No Treatment Response/Outcome: twitch with decreased concordant pain  There-ex: Supine piriformis stretch Sidelying clam Sidelying hip circles Seated figure 4 Standing quad stretch- foot on table, hands on chair SLS with toe prop There-Act:  Self Care:  Nuro-Re-ed:  Gait Training: Glut set at toe off to decrease trunk flexion in Lt stance phase   4/28 Manual: All PROM performed with distraction to reduce pain and improve movement STM trigger point therapy to lateral hip region, IT band There-ex: Nusetp warm up lvl 3 SAQ 2lb RPE 4 Leg ext machine 2x10 25lbs 2 up 1 down RPE 5 Seated abd machine 2x10 55lbs RPE 5 BW mini squats at railing 1x10      4/22 Manual:  Reviewed self soft tissue mobilization in  lumbar spine and hip   There-ex: Seated clamshell yellow RTB 2x10 Seated LAQ yellow RTB 2x10 Pt education on exercise grading and progressions    PATIENT EDUCATION:  Education details: HEP, symptom management, self care Person educated: Patient Education method: Explanation, Demonstration, Tactile cues, Verbal cues, and Handouts Education comprehension: verbalized understanding and returned demonstration  HOME EXERCISE PROGRAM: Access Code: [email or web address] URL: [email or web address] Date: 09/30/2023 Prepared by: Lonzell Robin  Exercises - Standing Upper Trapezius Mobilization with Small Ball  - 1 x daily - 7 x weekly - 3 sets - 10 reps - Theracane Under Arm   - 1 x daily - 7 x weekly - 3 sets - 10 reps - Seated Long Arc Quad  - 1 x daily - 7 x weekly - 3 sets - 10 reps - Seated Hip Abduction  - 1 x daily - 7 x weekly - 3 sets - 10 reps  ASSESSMENT:  CLINICAL IMPRESSION: 5/8 Pt warmed up on the nustep for with no increase in symptoms. TPDN was done to R glute region with strong trigger point released noted. Manual therapy was tolerated well today. Therapeutic exercises focused on stretching and strengthening. Pt will continue to benefit from skilled physical therapy to decrease irritability and increase strength and endurance for ADL's.      Eval: Patient is a 78 y.o. male who was seen today for physical therapy evaluation and treatment for left hip pain. Balance and gait were assessed noting diminished Objective measurements were taken and noted with strength deficits in hip abduction and knee extension. Trigger points were spotted in lateral hip near greater trochanter, IT band, and upper glute region. Pt was educated on at home soft tissue mobilizations with theracane and tennis ball. Exercises were performed for general strengthening. Education was provided on exercises for proper muscle activation and posture. Pt can progress  balance, strength, and gait  endurance. Pt will continue to benefit from skilled physical therapy to improve community ambulation and functional strength.   OBJECTIVE IMPAIRMENTS: Abnormal gait, decreased activity tolerance, decreased balance, decreased coordination, decreased endurance, decreased mobility, difficulty walking, decreased ROM, decreased strength, hypomobility, and impaired flexibility.   ACTIVITY LIMITATIONS: carrying, lifting, bending, standing, squatting, sleeping, stairs, bathing, toileting, dressing, and locomotion level  PARTICIPATION LIMITATIONS: cleaning, laundry, shopping, community activity, and yard work  PERSONAL FACTORS: 1-2 comorbidities: Lumbar surgery, balance are also affecting patient's functional outcome.   REHAB POTENTIAL: Good  CLINICAL DECISION MAKING: Evolving/moderate complexity fluctuation in pain levels with ambulation and balance  EVALUATION COMPLEXITY: Moderate   GOALS: Goals reviewed with patient? Yes  SHORT TERM GOALS: Target date: 10/28/2023   Pt will increase hip abduction strength by 5lbs for community ambulation. Baseline: Goal status: INITIAL  2.  Pt will increase knee extension strength by 5lbs for community ambulation.  Baseline:  Goal status: INITIAL  3.  Pt will be able to complete tandem stance with eyes closed with moderate swaying. Baseline:  Goal status: INITIAL    LONG TERM GOALS: Target date: 11/25/2023    Pt will increase hip abduction strength by 5lbs for community ambulation. Baseline:  Goal status: INITIAL  2.  Pt will increase knee extension strength by 5lbs for community ambulation.  Baseline:  Goal status: INITIAL  3.  Pt will be able to complete tandem stance with minimal swaying.  Baseline:  Goal status: INITIAL  4.  Pt will be able to ambulate community distances with increased endurance more than . Baseline:  Goal status: INITIAL    PLAN:  PT FREQUENCY: 1-2x/week  PT DURATION: 8 weeks  PLANNED INTERVENTIONS:  97164- PT Re-evaluation, 97110-Therapeutic exercises, 97530- Therapeutic activity, [id number]- Neuromuscular re-education, 97535- Self Care, 16109- Manual therapy, [id number]- Gait training, [phone]- Aquatic Therapy, [phone]- Electrical stimulation (unattended), [id number]- Electrical stimulation (manual), and 97012- Traction (mechanical)  PLAN FOR NEXT SESSION: Expand HEP, work on balance and gait training. Perform BERG.   Herminia Lope Meganne Rita SPT 10/16/23 8:48 AM

## 2023-10-16 ENCOUNTER — Encounter (HOSPITAL_BASED_OUTPATIENT_CLINIC_OR_DEPARTMENT_OTHER): Payer: Self-pay | Admitting: Physical Therapy

## 2023-10-20 ENCOUNTER — Encounter (HOSPITAL_BASED_OUTPATIENT_CLINIC_OR_DEPARTMENT_OTHER): Payer: Self-pay | Admitting: Physical Therapy

## 2023-10-20 ENCOUNTER — Ambulatory Visit (HOSPITAL_BASED_OUTPATIENT_CLINIC_OR_DEPARTMENT_OTHER): Admitting: Physical Therapy

## 2023-10-20 DIAGNOSIS — M25552 Pain in left hip: Secondary | ICD-10-CM | POA: Diagnosis not present

## 2023-10-20 DIAGNOSIS — M5459 Other low back pain: Secondary | ICD-10-CM | POA: Diagnosis not present

## 2023-10-20 DIAGNOSIS — M25652 Stiffness of left hip, not elsewhere classified: Secondary | ICD-10-CM | POA: Diagnosis not present

## 2023-10-20 DIAGNOSIS — M6281 Muscle weakness (generalized): Secondary | ICD-10-CM

## 2023-10-20 NOTE — Therapy (Signed)
 OUTPATIENT PHYSICAL THERAPY LOWER EXTREMITY EVALUATION   Patient Name: Jonathon Johnson MRN: [id number] DOB:1946-01-26, 78 y.o., male Today's Date: 10/20/2023  END OF SESSION:  PT End of Session - 10/20/23 0846     Visit Number 5    Number of Visits 16    Date for PT Re-Evaluation 11/25/23    PT Start Time 0847    PT Stop Time 0926    PT Time Calculation (min) 39 min    Activity Tolerance Patient tolerated treatment well;No increased pain    Behavior During Therapy [facility] for tasks assessed/performed                Past Medical History:  Diagnosis Date   Asthma    Chicken pox    GERD (gastroesophageal reflux disease)    Hyperlipidemia 11/04/2016   Hypertension 07/19/2018   Past Surgical History:  Procedure Laterality Date   CATARACT EXTRACTION Bilateral    02/2023   CHOLECYSTECTOMY     TRANSFORAMINAL LUMBAR INTERBODY FUSION (TLIF) WITH PEDICLE SCREW FIXATION 1 LEVEL Right 09/25/2021   Procedure: RIGHT-SIDED TRANSFORAMINAL LUMBAR INTERBODY FUSION AND DECOMPRESSION LUMBAR 4-  LUMBAR 5 WITH INSTRUMENTATION AND ALLOGRAFT;  Surgeon: Virl Grimes, MD;  Location: [facility] OR;  Service: Orthopedics;  Laterality: Right;   Patient Active Problem List   Diagnosis Date Noted   Arthritis of left hip 09/01/2023   Greater trochanteric bursitis of left hip 09/01/2023   Hyponatremia 08/07/2023   Radiculopathy 09/25/2021   Post herpetic neuralgia 11/08/2020   Chronic bilateral low back pain with bilateral sciatica 08/28/2020   Alcohol  use 03/16/2020   Thumb pain, right 06/28/2019   Arthralgia of right temporomandibular joint 12/20/2018   Diarrhea 12/20/2018   Screening for diabetes mellitus 07/19/2018   Insomnia 07/19/2018   Gastroesophageal reflux disease 07/19/2018   Benign prostatic hyperplasia with urinary hesitancy 07/19/2018   Essential hypertension 07/19/2018   Elevated LDL cholesterol level 07/19/2018   Cough 07/19/2018   Mild reactive airways disease 07/19/2018    PCP:  Tonna Frederic, MD  REFERRING PROVIDER: Isidro Margo, DO  REFERRING DIAG:  Diagnosis  M25.552 (ICD-10-CM) - Left hip pain    THERAPY DIAG:  Pain in left hip  Muscle weakness (generalized)  Rationale for Evaluation and Treatment: Rehabilitation  ONSET DATE: Before Christmas  SUBJECTIVE:   SUBJECTIVE STATEMENT: Yesterday worked out and then went grocery shopping- I think I overdid it. I found myself trying to adjust my gait again. Lateral pain down ITB to lateral knee.    Eval: Pt came in with left hip pain. Received recent injection for left trochanteric bursitis. Pt states pain and discomfort has been the same since and before the injection. Feels the pain most when walking and often has issues with endurance and balance. Is retired but wants to feel more comfortable ambulating community distances and being on his feet for longer periods of time. Pt states pain at worst is 2/10 and feels like a toothache. Pain is located deep in the hip and sometimes feels spots on the lateral hip.   PERTINENT HISTORY: (2022) LBP with neuro symptoms radiating into bilateral lower legs,  Disc space narrowing is noted throughout the lumbar spine. Mild anterolisthesis of a degenerative nature is noted at L4-5. Multilevel osteophytic changes are seen worst on the left at L1-2. No soft tissue abnormality is noted. PAIN:  Are you having pain? Yes: NPRS scale: 2/10 Pain location: deep Pain description: achy, toothache Aggravating factors: sleeping on left side Relieving factors: rest  PRECAUTIONS: None  RED FLAGS: None   WEIGHT BEARING RESTRICTIONS: No  FALLS:  Has patient fallen in last 6 months? Yes. Number of falls 2 last one was on Monday  LIVING ENVIRONMENT:   OCCUPATION: retired  PLOF: Independent  PATIENT GOALS: Find out what can help him feel better  NEXT MD VISIT: 5/27  OBJECTIVE:  Note: Objective measures were completed at Evaluation unless otherwise  noted.  DIAGNOSTIC FINDINGS:  FINDINGS: 2022 Five lumbar type vertebral bodies are well visualized. Vertebral body height is well maintained. Disc space narrowing is noted throughout the lumbar spine. Mild anterolisthesis of a degenerative nature is noted at L4-5. Multilevel osteophytic changes are seen worst on the left at L1-2. No soft tissue abnormality is noted.   IMPRESSION: Multilevel degenerative change with anterolisthesis at L4-5.  PATIENT SURVEYS:  LEFS 49/80  COGNITION: Overall cognitive status: Within functional limits for tasks assessed     SENSATION: WFL  POSTURE: rounded shoulders, forward head, and flexed trunk   PALPATION: TTP over lateral hip, IT band, glute region  LOWER EXTREMITY ROM:  Passive ROM Right eval Left eval  Hip flexion  limited  Hip extension    Hip abduction    Hip adduction    Hip internal rotation  limited  Hip external rotation  [facility]  Knee flexion    Knee extension    Ankle dorsiflexion    Ankle plantarflexion    Ankle inversion    Ankle eversion     (Blank rows = not tested)    LOWER EXTREMITY MMT:  MMT Right eval Left eval  Hip flexion 33.6 33.5  Hip extension    Hip abduction 39.2 27.3  Hip adduction    Hip internal rotation    Hip external rotation    Knee flexion    Knee extension 51.7 34.7  Ankle dorsiflexion    Ankle plantarflexion    Ankle inversion    Ankle eversion     (Blank rows = not tested)  LOWER EXTREMITY SPECIAL TESTS:  Hip special tests: FADIR: negative  FUNCTIONAL TESTS:  Feet together eyes closed: moderate swaying Tandem: moderate swaying  GAIT: 80ft with no AD gait   Moderate trendelenburg and avoidance                                                                                                                                Treatment Date:  Treatment                            5/13: Blank lines following charge title = not provided on this treatment date.   Manual:  TPDN  No Roller Rt quads & ITB There-ex: Mod thomas stretch with strap Bridge with ball bw knees, + chest press Marching bridge Hip hinge to sit<>stand Lateral resisted stepping at cable machine There-Act:  Self Care:  Nuro-Re-ed: Improving arm swing in gait Step  with fwd arm swing- cues for glut set Gait Training:   5/8 Manual: All PROM performed with distraction to reduce pain and improve movement Trigger Point Dry Needling  Subsequent Treatment: Instructions reviewed, if requested by the patient, prior to subsequent dry needling treatment.   Patient Verbal Consent Given: Yes Education Handout Provided: Previously Provided Muscles Treated: R glute 3x using a .30x50 needle Electrical Stimulation Performed: No Treatment Response/Outcome: strong muscle twitch response   There-ex: Nustep lvl 3 Leg ext 3x10 25lbs Leg press 3x10 40lbs  Cybex row machine 3x10 50lbs  Figure 4 stretch 3x30sec      Treatment                            4/30: Blank lines following charge title = not provided on this treatment date.   Manual:  TPDN YES Trigger Point Dry Needling  Subsequent Treatment: Instructions provided previously at initial dry needling treatment. - previously needled in [facility] system  Patient Verbal Consent Given: Yes Education Handout Provided: Previously Provided Muscles Treated: Lt Glut med/min, TFL Electrical Stimulation Performed: No Treatment Response/Outcome: twitch with decreased concordant pain  There-ex: Supine piriformis stretch Sidelying clam Sidelying hip circles Seated figure 4 Standing quad stretch- foot on table, hands on chair SLS with toe prop There-Act:  Self Care:  Nuro-Re-ed:  Gait Training: Glut set at toe off to decrease trunk flexion in Lt stance phase     PATIENT EDUCATION:  Education details: HEP, symptom management, self care Person educated: Patient Education method: Explanation, Demonstration, Tactile  cues, Verbal cues, and Handouts Education comprehension: verbalized understanding and returned demonstration  HOME EXERCISE PROGRAM: Access Code: [email or web address] URL: [email or web address] Date: 09/30/2023 Prepared by: Lonzell Robin  Exercises - Standing Upper Trapezius Mobilization with Small Ball  - 1 x daily - 7 x weekly - 3 sets - 10 reps - Theracane Under Arm   - 1 x daily - 7 x weekly - 3 sets - 10 reps - Seated Long Arc Quad  - 1 x daily - 7 x weekly - 3 sets - 10 reps - Seated Hip Abduction  - 1 x daily - 7 x weekly - 3 sets - 10 reps  ASSESSMENT:  CLINICAL IMPRESSION: Significant tension in Lt quads leading to genu valgus in stance phase. Noted pt does not swing his right arm in gait- felt that increasing this helped him correct LOB.      Eval: Patient is a 78 y.o. male who was seen today for physical therapy evaluation and treatment for left hip pain. Balance and gait were assessed noting diminished Objective measurements were taken and noted with strength deficits in hip abduction and knee extension. Trigger points were spotted in lateral hip near greater trochanter, IT band, and upper glute region. Pt was educated on at home soft tissue mobilizations with theracane and tennis ball. Exercises were performed for general strengthening. Education was provided on exercises for proper muscle activation and posture. Pt can progress balance, strength, and gait endurance. Pt will continue to benefit from skilled physical therapy to improve community ambulation and functional strength.   OBJECTIVE IMPAIRMENTS: Abnormal gait, decreased activity tolerance, decreased balance, decreased coordination, decreased endurance, decreased mobility, difficulty walking, decreased ROM, decreased strength, hypomobility, and impaired flexibility.   ACTIVITY LIMITATIONS: carrying, lifting, bending, standing, squatting, sleeping, stairs, bathing, toileting, dressing, and locomotion  level  PARTICIPATION LIMITATIONS: cleaning, laundry, shopping, community activity, and yard work  PERSONAL  FACTORS: 1-2 comorbidities: Lumbar surgery, balance are also affecting patient's functional outcome.   REHAB POTENTIAL: Good  CLINICAL DECISION MAKING: Evolving/moderate complexity fluctuation in pain levels with ambulation and balance  EVALUATION COMPLEXITY: Moderate   GOALS: Goals reviewed with patient? Yes  SHORT TERM GOALS: Target date: 10/28/2023   Pt will increase hip abduction strength by 5lbs for community ambulation. Baseline: Goal status: INITIAL  2.  Pt will increase knee extension strength by 5lbs for community ambulation.  Baseline:  Goal status: INITIAL  3.  Pt will be able to complete tandem stance with eyes closed with moderate swaying. Baseline:  Goal status: INITIAL    LONG TERM GOALS: Target date: 11/25/2023    Pt will increase hip abduction strength by 5lbs for community ambulation. Baseline:  Goal status: INITIAL  2.  Pt will increase knee extension strength by 5lbs for community ambulation.  Baseline:  Goal status: INITIAL  3.  Pt will be able to complete tandem stance with minimal swaying.  Baseline:  Goal status: INITIAL  4.  Pt will be able to ambulate community distances with increased endurance more than . Baseline:  Goal status: INITIAL    PLAN:  PT FREQUENCY: 1-2x/week  PT DURATION: 8 weeks  PLANNED INTERVENTIONS: 97164- PT Re-evaluation, 97110-Therapeutic exercises, 97530- Therapeutic activity, [id number]- Neuromuscular re-education, 97535- Self Care, 95284- Manual therapy, [id number]- Gait training, [phone]- Aquatic Therapy, [phone]- Electrical stimulation (unattended), [id number]- Electrical stimulation (manual), and 97012- Traction (mechanical)  PLAN FOR NEXT SESSION: Expand HEP, work on balance and gait training. Perform BERG.  Clois Montavon C. Anglia Blakley PT, DPT 10/20/23 9:30 AM

## 2023-10-22 ENCOUNTER — Encounter (HOSPITAL_BASED_OUTPATIENT_CLINIC_OR_DEPARTMENT_OTHER): Payer: Self-pay | Admitting: Physical Therapy

## 2023-10-22 ENCOUNTER — Ambulatory Visit (HOSPITAL_BASED_OUTPATIENT_CLINIC_OR_DEPARTMENT_OTHER): Payer: Self-pay | Admitting: Physical Therapy

## 2023-10-22 DIAGNOSIS — M25552 Pain in left hip: Secondary | ICD-10-CM | POA: Diagnosis not present

## 2023-10-22 DIAGNOSIS — M25652 Stiffness of left hip, not elsewhere classified: Secondary | ICD-10-CM

## 2023-10-22 DIAGNOSIS — M6281 Muscle weakness (generalized): Secondary | ICD-10-CM | POA: Diagnosis not present

## 2023-10-22 DIAGNOSIS — M5459 Other low back pain: Secondary | ICD-10-CM | POA: Diagnosis not present

## 2023-10-22 NOTE — Therapy (Signed)
 OUTPATIENT PHYSICAL THERAPY LOWER EXTREMITY EVALUATION   Patient Name: Jonathon Johnson MRN: [id number] DOB:Jul 22, 1945, 78 y.o., male Today's Date: 10/22/2023  END OF SESSION:  PT End of Session - 10/22/23 1427     Visit Number 6    Number of Visits 16    Date for PT Re-Evaluation 11/25/23    PT Start Time 1400    PT Stop Time 1440    PT Time Calculation (min) 40 min    Activity Tolerance Patient tolerated treatment well;No increased pain    Behavior During Therapy [facility] for tasks assessed/performed                 Past Medical History:  Diagnosis Date   Asthma    Chicken pox    GERD (gastroesophageal reflux disease)    Hyperlipidemia 11/04/2016   Hypertension 07/19/2018   Past Surgical History:  Procedure Laterality Date   CATARACT EXTRACTION Bilateral    02/2023   CHOLECYSTECTOMY     TRANSFORAMINAL LUMBAR INTERBODY FUSION (TLIF) WITH PEDICLE SCREW FIXATION 1 LEVEL Right 09/25/2021   Procedure: RIGHT-SIDED TRANSFORAMINAL LUMBAR INTERBODY FUSION AND DECOMPRESSION LUMBAR 4-  LUMBAR 5 WITH INSTRUMENTATION AND ALLOGRAFT;  Surgeon: Virl Grimes, MD;  Location: [facility] OR;  Service: Orthopedics;  Laterality: Right;   Patient Active Problem List   Diagnosis Date Noted   Arthritis of left hip 09/01/2023   Greater trochanteric bursitis of left hip 09/01/2023   Hyponatremia 08/07/2023   Radiculopathy 09/25/2021   Post herpetic neuralgia 11/08/2020   Chronic bilateral low back pain with bilateral sciatica 08/28/2020   Alcohol  use 03/16/2020   Thumb pain, right 06/28/2019   Arthralgia of right temporomandibular joint 12/20/2018   Diarrhea 12/20/2018   Screening for diabetes mellitus 07/19/2018   Insomnia 07/19/2018   Gastroesophageal reflux disease 07/19/2018   Benign prostatic hyperplasia with urinary hesitancy 07/19/2018   Essential hypertension 07/19/2018   Elevated LDL cholesterol level 07/19/2018   Cough 07/19/2018   Mild reactive airways disease 07/19/2018    PCP:  Tonna Frederic, MD  REFERRING PROVIDER: Isidro Margo, DO  REFERRING DIAG:  Diagnosis  M25.552 (ICD-10-CM) - Left hip pain    THERAPY DIAG:  Pain in left hip  Muscle weakness (generalized)  Stiffness of left hip, not elsewhere classified  Other low back pain  Rationale for Evaluation and Treatment: Rehabilitation  ONSET DATE: Before Christmas  SUBJECTIVE:   SUBJECTIVE STATEMENT: Pt states that gym exercise was comfortable and even felt better until the night time. Gait almost felt normal. Sleeping on it was uncomfortable and felt like a deep aching directly into the middle of the thigh and pain into lateral knee.     Eval: Pt came in with left hip pain. Received recent injection for left trochanteric bursitis. Pt states pain and discomfort has been the same since and before the injection. Feels the pain most when walking and often has issues with endurance and balance. Is retired but wants to feel more comfortable ambulating community distances and being on his feet for longer periods of time. Pt states pain at worst is 2/10 and feels like a toothache. Pain is located deep in the hip and sometimes feels spots on the lateral hip.   PERTINENT HISTORY: (2022) LBP with neuro symptoms radiating into bilateral lower legs,  Disc space narrowing is noted throughout the lumbar spine. Mild anterolisthesis of a degenerative nature is noted at L4-5. Multilevel osteophytic changes are seen worst on the left at L1-2. No soft tissue abnormality  is noted. PAIN:  Are you having pain? Yes: NPRS scale: 0.5/10 Pain location: deep Pain description: achy, toothache Aggravating factors: sleeping on left side Relieving factors: rest  PRECAUTIONS: None  RED FLAGS: None   WEIGHT BEARING RESTRICTIONS: No  FALLS:  Has patient fallen in last 6 months? Yes. Number of falls 2 last one was on Monday  LIVING ENVIRONMENT:   OCCUPATION: retired  PLOF: Independent  PATIENT GOALS:  Find out what can help him feel better  NEXT MD VISIT: 5/27  OBJECTIVE:  Note: Objective measures were completed at Evaluation unless otherwise noted.  DIAGNOSTIC FINDINGS:  FINDINGS: 2022 Five lumbar type vertebral bodies are well visualized. Vertebral body height is well maintained. Disc space narrowing is noted throughout the lumbar spine. Mild anterolisthesis of a degenerative nature is noted at L4-5. Multilevel osteophytic changes are seen worst on the left at L1-2. No soft tissue abnormality is noted.   IMPRESSION: Multilevel degenerative change with anterolisthesis at L4-5.  PATIENT SURVEYS:  LEFS 49/80  COGNITION: Overall cognitive status: Within functional limits for tasks assessed     SENSATION: WFL  POSTURE: rounded shoulders, forward head, and flexed trunk   PALPATION: TTP over lateral hip, IT band, glute region  LOWER EXTREMITY ROM:  Passive ROM Right eval Left eval  Hip flexion  limited  Hip extension    Hip abduction    Hip adduction    Hip internal rotation  limited  Hip external rotation  [facility]  Knee flexion    Knee extension    Ankle dorsiflexion    Ankle plantarflexion    Ankle inversion    Ankle eversion     (Blank rows = not tested)    LOWER EXTREMITY MMT:  MMT Right eval Left eval  Hip flexion 33.6 33.5  Hip extension    Hip abduction 39.2 27.3  Hip adduction    Hip internal rotation    Hip external rotation    Knee flexion    Knee extension 51.7 34.7  Ankle dorsiflexion    Ankle plantarflexion    Ankle inversion    Ankle eversion     (Blank rows = not tested)  LOWER EXTREMITY SPECIAL TESTS:  Hip special tests: FADIR: negative  FUNCTIONAL TESTS:  Feet together eyes closed: moderate swaying Tandem: moderate swaying  GAIT: 54ft with no AD gait   Moderate trendelenburg and avoidance                                                                                                                                 Treatment Date:  5/15  STM L quad and L posterior hip glutes  Prone quad stretch 30s 3x Supine piriformis with knee bent 30s 3x Half kneeling hip flexor stretch 30s 3x Anatomy of condition Self STM technique, gym exercise modifications    Treatment  5/13: Blank lines following charge title = not provided on this treatment date.   Manual:  TPDN No Roller Rt quads & ITB There-ex: Mod thomas stretch with strap Bridge with ball bw knees, + chest press Marching bridge Hip hinge to sit<>stand Lateral resisted stepping at cable machine There-Act:  Self Care:  Nuro-Re-ed: Improving arm swing in gait Step with fwd arm swing- cues for glut set Gait Training:   5/8 Manual: All PROM performed with distraction to reduce pain and improve movement Trigger Point Dry Needling  Subsequent Treatment: Instructions reviewed, if requested by the patient, prior to subsequent dry needling treatment.   Patient Verbal Consent Given: Yes Education Handout Provided: Previously Provided Muscles Treated: R glute 3x using a .30x50 needle Electrical Stimulation Performed: No Treatment Response/Outcome: strong muscle twitch response   There-ex: Nustep lvl 3 Leg ext 3x10 25lbs Leg press 3x10 40lbs  Cybex row machine 3x10 50lbs  Figure 4 stretch 3x30sec      Treatment                            4/30: Blank lines following charge title = not provided on this treatment date.   Manual:  TPDN YES Trigger Point Dry Needling  Subsequent Treatment: Instructions provided previously at initial dry needling treatment. - previously needled in [facility] system  Patient Verbal Consent Given: Yes Education Handout Provided: Previously Provided Muscles Treated: Lt Glut med/min, TFL Electrical Stimulation Performed: No Treatment Response/Outcome: twitch with decreased concordant pain  There-ex: Supine piriformis stretch Sidelying clam Sidelying  hip circles Seated figure 4 Standing quad stretch- foot on table, hands on chair SLS with toe prop There-Act:  Self Care:  Nuro-Re-ed:  Gait Training: Glut set at toe off to decrease trunk flexion in Lt stance phase     PATIENT EDUCATION:  Education details: HEP, symptom management, self care Person educated: Patient Education method: Explanation, Demonstration, Tactile cues, Verbal cues, and Handouts Education comprehension: verbalized understanding and returned demonstration  HOME EXERCISE PROGRAM: Access Code: [email or web address] URL: [email or web address] Date: 09/30/2023 Prepared by: Lonzell Robin  Exercises - Standing Upper Trapezius Mobilization with Small Ball  - 1 x daily - 7 x weekly - 3 sets - 10 reps - Theracane Under Arm   - 1 x daily - 7 x weekly - 3 sets - 10 reps - Seated Long Arc Quad  - 1 x daily - 7 x weekly - 3 sets - 10 reps - Seated Hip Abduction  - 1 x daily - 7 x weekly - 3 sets - 10 reps  ASSESSMENT:  CLINICAL IMPRESSION: Pt reports improvement of stiffness and discomfort of the L knee and thigh following session. STM and progressive stretching reduced sensitivity of the lateral knee and thigh pain. HEP updated today with new stretching. Pt advised on self STM techniques with ischemic pressure for sensitivity reduction. Plan to return to exercise at next session with addition of sidestepping, step ups, and SL leg press at next. Pt will continue to benefit from skilled physical therapy to improve community ambulation and functional strength.   Eval: Patient is a 78 y.o. male who was seen today for physical therapy evaluation and treatment for left hip pain. Balance and gait were assessed noting diminished Objective measurements were taken and noted with strength deficits in hip abduction and knee extension. Trigger points were spotted in lateral hip near greater trochanter, IT band, and upper glute region. Pt was  educated on at home soft tissue  mobilizations with theracane and tennis ball. Exercises were performed for general strengthening. Education was provided on exercises for proper muscle activation and posture. Pt can progress balance, strength, and gait endurance. Pt will continue to benefit from skilled physical therapy to improve community ambulation and functional strength.   OBJECTIVE IMPAIRMENTS: Abnormal gait, decreased activity tolerance, decreased balance, decreased coordination, decreased endurance, decreased mobility, difficulty walking, decreased ROM, decreased strength, hypomobility, and impaired flexibility.   ACTIVITY LIMITATIONS: carrying, lifting, bending, standing, squatting, sleeping, stairs, bathing, toileting, dressing, and locomotion level  PARTICIPATION LIMITATIONS: cleaning, laundry, shopping, community activity, and yard work  PERSONAL FACTORS: 1-2 comorbidities: Lumbar surgery, balance are also affecting patient's functional outcome.   REHAB POTENTIAL: Good  CLINICAL DECISION MAKING: Evolving/moderate complexity fluctuation in pain levels with ambulation and balance  EVALUATION COMPLEXITY: Moderate   GOALS: Goals reviewed with patient? Yes  SHORT TERM GOALS: Target date: 10/28/2023   Pt will increase hip abduction strength by 5lbs for community ambulation. Baseline: Goal status: INITIAL  2.  Pt will increase knee extension strength by 5lbs for community ambulation.  Baseline:  Goal status: INITIAL  3.  Pt will be able to complete tandem stance with eyes closed with moderate swaying. Baseline:  Goal status: INITIAL    LONG TERM GOALS: Target date: 11/25/2023    Pt will increase hip abduction strength by 5lbs for community ambulation. Baseline:  Goal status: INITIAL  2.  Pt will increase knee extension strength by 5lbs for community ambulation.  Baseline:  Goal status: INITIAL  3.  Pt will be able to complete tandem stance with minimal swaying.  Baseline:  Goal status:  INITIAL  4.  Pt will be able to ambulate community distances with increased endurance more than . Baseline:  Goal status: INITIAL    PLAN:  PT FREQUENCY: 1-2x/week  PT DURATION: 8 weeks  PLANNED INTERVENTIONS: 97164- PT Re-evaluation, 97110-Therapeutic exercises, 97530- Therapeutic activity, [id number]- Neuromuscular re-education, 97535- Self Care, [id number]- Manual therapy, [id number]- Gait training, [phone]- Aquatic Therapy, [phone]- Electrical stimulation (unattended), [id number]- Electrical stimulation (manual), and 97012- Traction (mechanical)  PLAN FOR NEXT SESSION: Expand HEP, work on balance and gait training. Perform BERG.   Silver Dross PT, DPT 10/22/23 2:47 PM

## 2023-10-27 ENCOUNTER — Ambulatory Visit (HOSPITAL_BASED_OUTPATIENT_CLINIC_OR_DEPARTMENT_OTHER): Admitting: Physical Therapy

## 2023-10-27 DIAGNOSIS — M5459 Other low back pain: Secondary | ICD-10-CM

## 2023-10-27 DIAGNOSIS — M25552 Pain in left hip: Secondary | ICD-10-CM

## 2023-10-27 DIAGNOSIS — M6281 Muscle weakness (generalized): Secondary | ICD-10-CM | POA: Diagnosis not present

## 2023-10-27 DIAGNOSIS — M25652 Stiffness of left hip, not elsewhere classified: Secondary | ICD-10-CM | POA: Diagnosis not present

## 2023-10-27 NOTE — Therapy (Signed)
 OUTPATIENT PHYSICAL THERAPY LOWER EXTREMITY EVALUATION   Patient Name: Jonathon Johnson MRN: [id number] DOB:December 11, 1945, 78 y.o., male Today's Date: 10/27/2023  END OF SESSION:  PT End of Session - 10/27/23 0936     Visit Number 7    Number of Visits 16    Date for PT Re-Evaluation 11/25/23    PT Start Time 0845    PT Stop Time 0926    PT Time Calculation (min) 41 min    Activity Tolerance Patient tolerated treatment well;No increased pain    Behavior During Therapy [facility] for tasks assessed/performed                  Past Medical History:  Diagnosis Date   Asthma    Chicken pox    GERD (gastroesophageal reflux disease)    Hyperlipidemia 11/04/2016   Hypertension 07/19/2018   Past Surgical History:  Procedure Laterality Date   CATARACT EXTRACTION Bilateral    02/2023   CHOLECYSTECTOMY     TRANSFORAMINAL LUMBAR INTERBODY FUSION (TLIF) WITH PEDICLE SCREW FIXATION 1 LEVEL Right 09/25/2021   Procedure: RIGHT-SIDED TRANSFORAMINAL LUMBAR INTERBODY FUSION AND DECOMPRESSION LUMBAR 4-  LUMBAR 5 WITH INSTRUMENTATION AND ALLOGRAFT;  Surgeon: Virl Grimes, MD;  Location: [facility] OR;  Service: Orthopedics;  Laterality: Right;   Patient Active Problem List   Diagnosis Date Noted   Arthritis of left hip 09/01/2023   Greater trochanteric bursitis of left hip 09/01/2023   Hyponatremia 08/07/2023   Radiculopathy 09/25/2021   Post herpetic neuralgia 11/08/2020   Chronic bilateral low back pain with bilateral sciatica 08/28/2020   Alcohol  use 03/16/2020   Thumb pain, right 06/28/2019   Arthralgia of right temporomandibular joint 12/20/2018   Diarrhea 12/20/2018   Screening for diabetes mellitus 07/19/2018   Insomnia 07/19/2018   Gastroesophageal reflux disease 07/19/2018   Benign prostatic hyperplasia with urinary hesitancy 07/19/2018   Essential hypertension 07/19/2018   Elevated LDL cholesterol level 07/19/2018   Cough 07/19/2018   Mild reactive airways disease 07/19/2018     PCP: Tonna Frederic, MD  REFERRING PROVIDER: Isidro Margo, DO  REFERRING DIAG:  Diagnosis  M25.552 (ICD-10-CM) - Left hip pain    THERAPY DIAG:  Pain in left hip  Muscle weakness (generalized)  Stiffness of left hip, not elsewhere classified  Other low back pain  Rationale for Evaluation and Treatment: Rehabilitation  ONSET DATE: Before Christmas  SUBJECTIVE:   SUBJECTIVE STATEMENT: Pt notes that the pain is better with less frequency of pain. He did have aching after long periods sitting and standing/walking. Pt has been doing more knee extension and leg press in the gym.    Eval: Pt came in with left hip pain. Received recent injection for left trochanteric bursitis. Pt states pain and discomfort has been the same since and before the injection. Feels the pain most when walking and often has issues with endurance and balance. Is retired but wants to feel more comfortable ambulating community distances and being on his feet for longer periods of time. Pt states pain at worst is 2/10 and feels like a toothache. Pain is located deep in the hip and sometimes feels spots on the lateral hip.   PERTINENT HISTORY: (2022) LBP with neuro symptoms radiating into bilateral lower legs,  Disc space narrowing is noted throughout the lumbar spine. Mild anterolisthesis of a degenerative nature is noted at L4-5. Multilevel osteophytic changes are seen worst on the left at L1-2. No soft tissue abnormality is noted. PAIN:  Are you having  pain? No: NPRS scale: 0/10 Pain location: deep Pain description: achy, toothache Aggravating factors: sleeping on left side Relieving factors: rest  PRECAUTIONS: None  RED FLAGS: None   WEIGHT BEARING RESTRICTIONS: No  FALLS:  Has patient fallen in last 6 months? Yes. Number of falls 2 last one was on Monday  LIVING ENVIRONMENT:   OCCUPATION: retired  PLOF: Independent  PATIENT GOALS: Find out what can help him feel  better  NEXT MD VISIT: 5/27  OBJECTIVE:  Note: Objective measures were completed at Evaluation unless otherwise noted.  DIAGNOSTIC FINDINGS:  FINDINGS: 2022 Five lumbar type vertebral bodies are well visualized. Vertebral body height is well maintained. Disc space narrowing is noted throughout the lumbar spine. Mild anterolisthesis of a degenerative nature is noted at L4-5. Multilevel osteophytic changes are seen worst on the left at L1-2. No soft tissue abnormality is noted.   IMPRESSION: Multilevel degenerative change with anterolisthesis at L4-5.  PATIENT SURVEYS:  LEFS 49/80  COGNITION: Overall cognitive status: Within functional limits for tasks assessed     SENSATION: WFL  POSTURE: rounded shoulders, forward head, and flexed trunk   PALPATION: TTP over lateral hip, IT band, glute region  LOWER EXTREMITY ROM:  Passive ROM Right eval Left eval  Hip flexion  limited  Hip extension    Hip abduction    Hip adduction    Hip internal rotation  limited  Hip external rotation  [facility]  Knee flexion    Knee extension    Ankle dorsiflexion    Ankle plantarflexion    Ankle inversion    Ankle eversion     (Blank rows = not tested)    LOWER EXTREMITY MMT:  MMT Right eval Left eval  Hip flexion 33.6 33.5  Hip extension    Hip abduction 39.2 27.3  Hip adduction    Hip internal rotation    Hip external rotation    Knee flexion    Knee extension 51.7 34.7  Ankle dorsiflexion    Ankle plantarflexion    Ankle inversion    Ankle eversion     (Blank rows = not tested)  LOWER EXTREMITY SPECIAL TESTS:  Hip special tests: FADIR: negative  FUNCTIONAL TESTS:  Feet together eyes closed: moderate swaying Tandem: moderate swaying  GAIT: 32ft with no AD gait   Moderate trendelenburg and avoidance                                                                                                                                Treatment Date:  5/20   STM L quad  and L posterior hip glutes  Prone quad stretch 30s 3x STS with mid thigh height with GTB at knees 3x8 Sidestepping in hall GTB at knees 3x8 8" box step up 3x8  Gym exercise and frequency with HEP updates   5/15  STM L quad and L posterior hip glutes  Prone quad stretch 30s 3x Supine piriformis with  knee bent 30s 3x Half kneeling hip flexor stretch 30s 3x Anatomy of condition Self STM technique, gym exercise modifications    Treatment                            5/13: Blank lines following charge title = not provided on this treatment date.   Manual:  TPDN No Roller Rt quads & ITB There-ex: Mod thomas stretch with strap Bridge with ball bw knees, + chest press Marching bridge Hip hinge to sit<>stand Lateral resisted stepping at cable machine There-Act:  Self Care:  Nuro-Re-ed: Improving arm swing in gait Step with fwd arm swing- cues for glut set Gait Training:   5/8 Manual: All PROM performed with distraction to reduce pain and improve movement Trigger Point Dry Needling  Subsequent Treatment: Instructions reviewed, if requested by the patient, prior to subsequent dry needling treatment.   Patient Verbal Consent Given: Yes Education Handout Provided: Previously Provided Muscles Treated: R glute 3x using a .30x50 needle Electrical Stimulation Performed: No Treatment Response/Outcome: strong muscle twitch response   There-ex: Nustep lvl 3 Leg ext 3x10 25lbs Leg press 3x10 40lbs  Cybex row machine 3x10 50lbs  Figure 4 stretch 3x30sec      Treatment                            4/30: Blank lines following charge title = not provided on this treatment date.   Manual:  TPDN YES Trigger Point Dry Needling  Subsequent Treatment: Instructions provided previously at initial dry needling treatment. - previously needled in [facility] system  Patient Verbal Consent Given: Yes Education Handout Provided: Previously Provided Muscles  Treated: Lt Glut med/min, TFL Electrical Stimulation Performed: No Treatment Response/Outcome: twitch with decreased concordant pain  There-ex: Supine piriformis stretch Sidelying clam Sidelying hip circles Seated figure 4 Standing quad stretch- foot on table, hands on chair SLS with toe prop There-Act:  Self Care:  Nuro-Re-ed:  Gait Training: Glut set at toe off to decrease trunk flexion in Lt stance phase     PATIENT EDUCATION:  Education details: HEP, symptom management, self care Person educated: Patient Education method: Explanation, Demonstration, Tactile cues, Verbal cues, and Handouts Education comprehension: verbalized understanding and returned demonstration  HOME EXERCISE PROGRAM: Access Code:   URL: [email or web address] Date: 09/30/2023 Prepared by: Lonzell Robin  Exercises - Standing Upper Trapezius Mobilization with Small Ball  - 1 x daily - 7 x weekly - 3 sets - 10 reps - Theracane Under Arm   - 1 x daily - 7 x weekly - 3 sets - 10 reps - Seated Long Arc Quad  - 1 x daily - 7 x weekly - 3 sets - 10 reps - Seated Hip Abduction  - 1 x daily - 7 x weekly - 3 sets - 10 reps  ASSESSMENT:  CLINICAL IMPRESSION: Pt with continued improvement in L hip and thigh pain since last session with report in decreased frequency of pain. No pain during exercise today and able to progress to CKC/BW loaded exercise without compensations. HEP updated and edu of integration into gym based exercise also discussed. Consider intro to more SL exercise at next with machines and free weights for integration of therapy into regular exercise routine. Pt will continue to benefit from skilled physical therapy to improve community ambulation and functional strength.   Eval: Patient is a 78 y.o. male  who was seen today for physical therapy evaluation and treatment for left hip pain. Balance and gait were assessed noting diminished Objective measurements were taken and noted  with strength deficits in hip abduction and knee extension. Trigger points were spotted in lateral hip near greater trochanter, IT band, and upper glute region. Pt was educated on at home soft tissue mobilizations with theracane and tennis ball. Exercises were performed for general strengthening. Education was provided on exercises for proper muscle activation and posture. Pt can progress balance, strength, and gait endurance. Pt will continue to benefit from skilled physical therapy to improve community ambulation and functional strength.   OBJECTIVE IMPAIRMENTS: Abnormal gait, decreased activity tolerance, decreased balance, decreased coordination, decreased endurance, decreased mobility, difficulty walking, decreased ROM, decreased strength, hypomobility, and impaired flexibility.   ACTIVITY LIMITATIONS: carrying, lifting, bending, standing, squatting, sleeping, stairs, bathing, toileting, dressing, and locomotion level  PARTICIPATION LIMITATIONS: cleaning, laundry, shopping, community activity, and yard work  PERSONAL FACTORS: 1-2 comorbidities: Lumbar surgery, balance are also affecting patient's functional outcome.   REHAB POTENTIAL: Good  CLINICAL DECISION MAKING: Evolving/moderate complexity fluctuation in pain levels with ambulation and balance  EVALUATION COMPLEXITY: Moderate   GOALS: Goals reviewed with patient? Yes  SHORT TERM GOALS: Target date: 10/28/2023   Pt will increase hip abduction strength by 5lbs for community ambulation. Baseline: Goal status: INITIAL  2.  Pt will increase knee extension strength by 5lbs for community ambulation.  Baseline:  Goal status: INITIAL  3.  Pt will be able to complete tandem stance with eyes closed with moderate swaying. Baseline:  Goal status: INITIAL    LONG TERM GOALS: Target date: 11/25/2023    Pt will increase hip abduction strength by 5lbs for community ambulation. Baseline:  Goal status: INITIAL  2.  Pt will increase  knee extension strength by 5lbs for community ambulation.  Baseline:  Goal status: INITIAL  3.  Pt will be able to complete tandem stance with minimal swaying.  Baseline:  Goal status: INITIAL  4.  Pt will be able to ambulate community distances with increased endurance more than . Baseline:  Goal status: INITIAL    PLAN:  PT FREQUENCY: 1-2x/week  PT DURATION: 8 weeks  PLANNED INTERVENTIONS: 97164- PT Re-evaluation, 97110-Therapeutic exercises, 97530- Therapeutic activity, [id number]- Neuromuscular re-education, 97535- Self Care, [id number]- Manual therapy, [id number]- Gait training, [id number]- Aquatic Therapy, [phone]- Electrical stimulation (unattended), [id number]- Electrical stimulation (manual), and 97012- Traction (mechanical)  PLAN FOR NEXT SESSION: Expand HEP, work on balance and gait training. Perform BERG.   Silver Dross PT, DPT 10/27/23 9:39 AM

## 2023-10-29 ENCOUNTER — Ambulatory Visit (HOSPITAL_BASED_OUTPATIENT_CLINIC_OR_DEPARTMENT_OTHER): Admitting: Physical Therapy

## 2023-10-29 ENCOUNTER — Encounter (HOSPITAL_BASED_OUTPATIENT_CLINIC_OR_DEPARTMENT_OTHER): Payer: Self-pay | Admitting: Physical Therapy

## 2023-10-29 DIAGNOSIS — M25552 Pain in left hip: Secondary | ICD-10-CM | POA: Diagnosis not present

## 2023-10-29 DIAGNOSIS — M5459 Other low back pain: Secondary | ICD-10-CM | POA: Diagnosis not present

## 2023-10-29 DIAGNOSIS — M6281 Muscle weakness (generalized): Secondary | ICD-10-CM

## 2023-10-29 DIAGNOSIS — M25652 Stiffness of left hip, not elsewhere classified: Secondary | ICD-10-CM | POA: Diagnosis not present

## 2023-10-29 NOTE — Therapy (Signed)
 OUTPATIENT PHYSICAL THERAPY LOWER EXTREMITY EVALUATION   Patient Name: Jonathon Johnson MRN: [id number] DOB:March 15, 1946, 78 y.o., male Today's Date: 10/29/2023  END OF SESSION:  PT End of Session - 10/29/23 0932     Visit Number 8    Number of Visits 16    Date for PT Re-Evaluation 11/25/23    PT Start Time 0845    PT Stop Time 0925    PT Time Calculation (min) 40 min    Activity Tolerance Patient tolerated treatment well;No increased pain    Behavior During Therapy [facility] for tasks assessed/performed                   Past Medical History:  Diagnosis Date   Asthma    Chicken pox    GERD (gastroesophageal reflux disease)    Hyperlipidemia 11/04/2016   Hypertension 07/19/2018   Past Surgical History:  Procedure Laterality Date   CATARACT EXTRACTION Bilateral    02/2023   CHOLECYSTECTOMY     TRANSFORAMINAL LUMBAR INTERBODY FUSION (TLIF) WITH PEDICLE SCREW FIXATION 1 LEVEL Right 09/25/2021   Procedure: RIGHT-SIDED TRANSFORAMINAL LUMBAR INTERBODY FUSION AND DECOMPRESSION LUMBAR 4-  LUMBAR 5 WITH INSTRUMENTATION AND ALLOGRAFT;  Surgeon: Virl Grimes, MD;  Location: [facility] OR;  Service: Orthopedics;  Laterality: Right;   Patient Active Problem List   Diagnosis Date Noted   Arthritis of left hip 09/01/2023   Greater trochanteric bursitis of left hip 09/01/2023   Hyponatremia 08/07/2023   Radiculopathy 09/25/2021   Post herpetic neuralgia 11/08/2020   Chronic bilateral low back pain with bilateral sciatica 08/28/2020   Alcohol  use 03/16/2020   Thumb pain, right 06/28/2019   Arthralgia of right temporomandibular joint 12/20/2018   Diarrhea 12/20/2018   Screening for diabetes mellitus 07/19/2018   Insomnia 07/19/2018   Gastroesophageal reflux disease 07/19/2018   Benign prostatic hyperplasia with urinary hesitancy 07/19/2018   Essential hypertension 07/19/2018   Elevated LDL cholesterol level 07/19/2018   Cough 07/19/2018   Mild reactive airways disease 07/19/2018     PCP: Tonna Frederic, MD  REFERRING PROVIDER: Isidro Margo, DO  REFERRING DIAG:  Diagnosis  M25.552 (ICD-10-CM) - Left hip pain    THERAPY DIAG:  Pain in left hip  Muscle weakness (generalized)  Stiffness of left hip, not elsewhere classified  Other low back pain  Rationale for Evaluation and Treatment: Rehabilitation  ONSET DATE: Before Christmas  SUBJECTIVE:   SUBJECTIVE STATEMENT: Pt with mild discomfort with longer period of sitting and then standing. No pain since last session other than mild soreness into the lateral knee. Pt notes that capacity to do things is improving from day to day.    Eval: Pt came in with left hip pain. Received recent injection for left trochanteric bursitis. Pt states pain and discomfort has been the same since and before the injection. Feels the pain most when walking and often has issues with endurance and balance. Is retired but wants to feel more comfortable ambulating community distances and being on his feet for longer periods of time. Pt states pain at worst is 2/10 and feels like a toothache. Pain is located deep in the hip and sometimes feels spots on the lateral hip.   PERTINENT HISTORY: (2022) LBP with neuro symptoms radiating into bilateral lower legs,  Disc space narrowing is noted throughout the lumbar spine. Mild anterolisthesis of a degenerative nature is noted at L4-5. Multilevel osteophytic changes are seen worst on the left at L1-2. No soft tissue abnormality is noted. PAIN:  Are you having pain? No: NPRS scale: 0/10 Pain location: deep Pain description: achy, toothache Aggravating factors: sleeping on left side Relieving factors: rest  PRECAUTIONS: None  RED FLAGS: None   WEIGHT BEARING RESTRICTIONS: No  FALLS:  Has patient fallen in last 6 months? Yes. Number of falls 2 last one was on Monday  LIVING ENVIRONMENT:   OCCUPATION: retired  PLOF: Independent  PATIENT GOALS: Find out what can  help him feel better  NEXT MD VISIT: 5/27  OBJECTIVE:  Note: Objective measures were completed at Evaluation unless otherwise noted.  DIAGNOSTIC FINDINGS:  FINDINGS: 2022 Five lumbar type vertebral bodies are well visualized. Vertebral body height is well maintained. Disc space narrowing is noted throughout the lumbar spine. Mild anterolisthesis of a degenerative nature is noted at L4-5. Multilevel osteophytic changes are seen worst on the left at L1-2. No soft tissue abnormality is noted.   IMPRESSION: Multilevel degenerative change with anterolisthesis at L4-5.  PATIENT SURVEYS:  LEFS 49/80  COGNITION: Overall cognitive status: Within functional limits for tasks assessed     SENSATION: WFL  POSTURE: rounded shoulders, forward head, and flexed trunk   PALPATION: TTP over lateral hip, IT band, glute region  LOWER EXTREMITY ROM:  Passive ROM Right eval Left eval  Hip flexion  limited  Hip extension    Hip abduction    Hip adduction    Hip internal rotation  limited  Hip external rotation  [facility]  Knee flexion    Knee extension    Ankle dorsiflexion    Ankle plantarflexion    Ankle inversion    Ankle eversion     (Blank rows = not tested)    LOWER EXTREMITY MMT:  MMT Right eval Left eval  Hip flexion 33.6 33.5  Hip extension    Hip abduction 39.2 27.3  Hip adduction    Hip internal rotation    Hip external rotation    Knee flexion    Knee extension 51.7 34.7  Ankle dorsiflexion    Ankle plantarflexion    Ankle inversion    Ankle eversion     (Blank rows = not tested)  LOWER EXTREMITY SPECIAL TESTS:  Hip special tests: FADIR: negative  FUNCTIONAL TESTS:  Feet together eyes closed: moderate swaying Tandem: moderate swaying  GAIT: 23ft with no AD gait   Moderate trendelenburg and avoidance                                                                                                                                Treatment  Date:  5/22  Nustep lvl 5 5 min  Stretch 30s 3x: prone quad, pirformis, supine HS  4" lateral step down 3x8 SL squat to table with R heel touch 4x6 S/L shuttle leg press 50lbs 3x8  Tandem walk hallway 3x   5/20   STM L quad and L posterior hip glutes  Prone quad stretch 30s 3x STS with mid thigh  height with GTB at knees 3x8 Sidestepping in hall GTB at knees 3x8 8" box step up 3x8  Gym exercise and frequency with HEP updates   5/15  STM L quad and L posterior hip glutes  Prone quad stretch 30s 3x Supine piriformis with knee bent 30s 3x Half kneeling hip flexor stretch 30s 3x Anatomy of condition Self STM technique, gym exercise modifications    Treatment                            5/13: Blank lines following charge title = not provided on this treatment date.   Manual:  TPDN No Roller Rt quads & ITB There-ex: Mod thomas stretch with strap Bridge with ball bw knees, + chest press Marching bridge Hip hinge to sit<>stand Lateral resisted stepping at cable machine There-Act:  Self Care:  Nuro-Re-ed: Improving arm swing in gait Step with fwd arm swing- cues for glut set Gait Training:   5/8 Manual: All PROM performed with distraction to reduce pain and improve movement Trigger Point Dry Needling  Subsequent Treatment: Instructions reviewed, if requested by the patient, prior to subsequent dry needling treatment.   Patient Verbal Consent Given: Yes Education Handout Provided: Previously Provided Muscles Treated: R glute 3x using a .30x50 needle Electrical Stimulation Performed: No Treatment Response/Outcome: strong muscle twitch response   There-ex: Nustep lvl 3 Leg ext 3x10 25lbs Leg press 3x10 40lbs  Cybex row machine 3x10 50lbs  Figure 4 stretch 3x30sec      Treatment                            4/30: Blank lines following charge title = not provided on this treatment date.   Manual:  TPDN YES Trigger Point Dry  Needling  Subsequent Treatment: Instructions provided previously at initial dry needling treatment. - previously needled in [facility] system  Patient Verbal Consent Given: Yes Education Handout Provided: Previously Provided Muscles Treated: Lt Glut med/min, TFL Electrical Stimulation Performed: No Treatment Response/Outcome: twitch with decreased concordant pain  There-ex: Supine piriformis stretch Sidelying clam Sidelying hip circles Seated figure 4 Standing quad stretch- foot on table, hands on chair SLS with toe prop There-Act:  Self Care:  Nuro-Re-ed:  Gait Training: Glut set at toe off to decrease trunk flexion in Lt stance phase     PATIENT EDUCATION:  Education details: HEP, symptom management, self care Person educated: Patient Education method: Explanation, Demonstration, Tactile cues, Verbal cues, and Handouts Education comprehension: verbalized understanding and returned demonstration  HOME EXERCISE PROGRAM: Access Code:  [email or web address] URL: [email or web address] Date: 09/30/2023 Prepared by: Lonzell Robin  Exercises - Standing Upper Trapezius Mobilization with Small Ball  - 1 x daily - 7 x weekly - 3 sets - 10 reps - Theracane Under Arm   - 1 x daily - 7 x weekly - 3 sets - 10 reps - Seated Long Arc Quad  - 1 x daily - 7 x weekly - 3 sets - 10 reps - Seated Hip Abduction  - 1 x daily - 7 x weekly - 3 sets - 10 reps  ASSESSMENT:  CLINICAL IMPRESSION: Pt with good tolerance to progression of SL exercise today without pain to the lateral knee. Pt does fatigue very quickly into the quad and glute with new exercise progression but able to perform all repetitions with adequate rest breaks. SL CKC form does progressively worsen  as expected with fatigue. Plan to continue with progression of exercise at next and updated HEP at next with new SL exercise if there is no adverse response. Pt will continue to benefit from skilled physical therapy to improve community  ambulation and functional strength.   Eval: Patient is a 78 y.o. male who was seen today for physical therapy evaluation and treatment for left hip pain. Balance and gait were assessed noting diminished Objective measurements were taken and noted with strength deficits in hip abduction and knee extension. Trigger points were spotted in lateral hip near greater trochanter, IT band, and upper glute region. Pt was educated on at home soft tissue mobilizations with theracane and tennis ball. Exercises were performed for general strengthening. Education was provided on exercises for proper muscle activation and posture. Pt can progress balance, strength, and gait endurance. Pt will continue to benefit from skilled physical therapy to improve community ambulation and functional strength.   OBJECTIVE IMPAIRMENTS: Abnormal gait, decreased activity tolerance, decreased balance, decreased coordination, decreased endurance, decreased mobility, difficulty walking, decreased ROM, decreased strength, hypomobility, and impaired flexibility.   ACTIVITY LIMITATIONS: carrying, lifting, bending, standing, squatting, sleeping, stairs, bathing, toileting, dressing, and locomotion level  PARTICIPATION LIMITATIONS: cleaning, laundry, shopping, community activity, and yard work  PERSONAL FACTORS: 1-2 comorbidities: Lumbar surgery, balance are also affecting patient's functional outcome.   REHAB POTENTIAL: Good  CLINICAL DECISION MAKING: Evolving/moderate complexity fluctuation in pain levels with ambulation and balance  EVALUATION COMPLEXITY: Moderate   GOALS: Goals reviewed with patient? Yes  SHORT TERM GOALS: Target date: 10/28/2023   Pt will increase hip abduction strength by 5lbs for community ambulation. Baseline: Goal status: INITIAL  2.  Pt will increase knee extension strength by 5lbs for community ambulation.  Baseline:  Goal status: INITIAL  3.  Pt will be able to complete tandem stance with eyes  closed with moderate swaying. Baseline:  Goal status: INITIAL    LONG TERM GOALS: Target date: 11/25/2023    Pt will increase hip abduction strength by 5lbs for community ambulation. Baseline:  Goal status: INITIAL  2.  Pt will increase knee extension strength by 5lbs for community ambulation.  Baseline:  Goal status: INITIAL  3.  Pt will be able to complete tandem stance with minimal swaying.  Baseline:  Goal status: INITIAL  4.  Pt will be able to ambulate community distances with increased endurance more than . Baseline:  Goal status: INITIAL    PLAN:  PT FREQUENCY: 1-2x/week  PT DURATION: 8 weeks  PLANNED INTERVENTIONS: 97164- PT Re-evaluation, 97110-Therapeutic exercises, 97530- Therapeutic activity, [id number]- Neuromuscular re-education, 97535- Self Care, 13244- Manual therapy, [id number]- Gait training, [phone]- Aquatic Therapy, [phone]- Electrical stimulation (unattended), [id number]- Electrical stimulation (manual), and 97012- Traction (mechanical)  PLAN FOR NEXT SESSION: Expand HEP, work on balance and gait training.    Silver Dross PT, DPT 10/29/23 9:36 AM

## 2023-10-29 NOTE — Progress Notes (Unsigned)
 [facility] [address] [place] [address] Phone: [phone] Subjective:    I'm seeing this patient by the request  of:  Tonna Frederic, MD  CC: Left hip pain  [id number]  09/01/2023 Patient given injection and tolerated the procedure well, discussed icing regimen of home exercises, discussed which activities to do and which ones to avoid. Increase activity slowly. I do think that this is secondary to the compensation for the underlying hip arthritis but hopefully will help him with some of the nighttime pain that he was experiencing. Patient is in agreement with the plan. Follow-up again in 2 months   Update 11/03/2023 Jonathon Johnson is a 78 y.o. male coming in with complaint of L GT pain. Patient referred for PT but has not been seen. Patient states injection did not help. PT and home exercises working. Doing much better.      Past Medical History:  Diagnosis Date   Asthma    Chicken pox    GERD (gastroesophageal reflux disease)    Hyperlipidemia 11/04/2016   Hypertension 07/19/2018   Past Surgical History:  Procedure Laterality Date   CATARACT EXTRACTION Bilateral    02/2023   CHOLECYSTECTOMY     TRANSFORAMINAL LUMBAR INTERBODY FUSION (TLIF) WITH PEDICLE SCREW FIXATION 1 LEVEL Right 09/25/2021   Procedure: RIGHT-SIDED TRANSFORAMINAL LUMBAR INTERBODY FUSION AND DECOMPRESSION LUMBAR 4-  LUMBAR 5 WITH INSTRUMENTATION AND ALLOGRAFT;  Surgeon: Virl Grimes, MD;  Location: [facility] OR;  Service: Orthopedics;  Laterality: Right;   Social History   Socioeconomic History   Marital status: Married    Spouse name: Not on file   Number of children: Not on file   Years of education: Not on file   Highest education level: Some college, no degree  Occupational History   Not on file  Tobacco Use   Smoking status: Former    Current packs/day: 0.00    Types: Cigarettes    Quit date: 07/19/2010    Years since quitting: 13.3   Smokeless  tobacco: Never  Vaping Use   Vaping status: Never Used  Substance and Sexual Activity   Alcohol  use: Yes    Alcohol /week: 14.0 standard drinks of alcohol     Types: 14 Glasses of wine per week    Comment: 3-4 glasses of wine daily   Drug use: Never   Sexual activity: Yes    Partners: Female  Other Topics Concern   Not on file  Social History Narrative   Right handed   Social Drivers of Health   Financial Resource Strain: Low Risk  (08/07/2023)   Overall Financial Resource Strain (CARDIA)    Difficulty of Paying Living Expenses: Not hard at all  Food Insecurity: No Food Insecurity (08/07/2023)   Hunger Vital Sign    Worried About Running Out of Food in the Last Year: Never true    Ran Out of Food in the Last Year: Never true  Transportation Needs: No Transportation Needs (08/07/2023)   PRAPARE - Transportation    Lack of Transportation (Medical): No    Lack of Transportation (Non-Medical): No  Physical Activity: Sufficiently Active (08/07/2023)   Exercise Vital Sign    Days of Exercise per Week: 3 days    Minutes of Exercise per Session: 50 min  Recent Concern: Physical Activity - Insufficiently Active (08/02/2023)   Exercise Vital Sign    Days of Exercise per Week: 3 days    Minutes of Exercise per Session: 40 min  Stress:  No Stress Concern Present (08/07/2023)   [name] Stress Questionnaire    Feeling of Stress : Not at all  Social Connections: Moderately Integrated (08/07/2023)   Social Connection and Isolation Panel [NHANES]    Frequency of Communication with Friends and Family: Three times a week    Frequency of Social Gatherings with Friends and Family: Once a week    Attends Religious Services: More than 4 times per year    Active Member of [employer] or Organizations: No    Attends Engineer, structural: Never    Marital Status: Married  Recent Concern: Social Connections - Moderately Isolated (08/02/2023)   Social  Connection and Isolation Panel [NHANES]    Frequency of Communication with Friends and Family: Once a week    Frequency of Social Gatherings with Friends and Family: Once a week    Attends Religious Services: More than 4 times per year    Active Member of [employer] or Organizations: No    Attends Engineer, structural: Not on file    Marital Status: Married   Allergies  Allergen Reactions   Amoxicillin Diarrhea   Morphine And Codeine Nausea And Vomiting   Family History  Problem Relation Age of Onset   Alcohol  abuse Mother    Depression Mother    Diabetes Mother    Alcohol  abuse Father    COPD Father    Cancer Sister    Hypertension Brother    Migraines Daughter    Multiple sclerosis Daughter      Current Outpatient Medications (Cardiovascular):    carvedilol  (COREG ) 25 MG tablet, Take 1 tablet (25 mg total) by mouth 2 (two) times daily with a meal.   rosuvastatin  (CRESTOR ) 20 MG tablet, Take 1 tablet (20 mg total) by mouth daily.   Study - VICTORION-1 PREVENT - inclisiran 300 mg/1.58mL or placebo SQ injection (PI-Stuckey), Inject 300 mg into the skin every 6 (six) months. For Investigational Use Only. Inject 1.5 mL subcutaneously into the abdomen, upper arm or thigh. Do not inject into areas of active skin disease, such as sunburns, skin rashes, inflammation, or skin infections. Administered on day 1, day 90, and every 6 months thereafter. Please contact [facility] for any questions or concerns regarding this medication.   valsartan  (DIOVAN ) 160 MG tablet, TAKE 1 TABLET DAILY  Current Outpatient Medications (Respiratory):    albuterol  (VENTOLIN  HFA) 108 (90 Base) MCG/ACT inhaler, Inhale 2 puffs into the lungs every 6 (six) hours as needed for wheezing or shortness of breath.   fluticasone  (FLONASE ) 50 MCG/ACT nasal spray, Place 2 sprays into both nostrils daily.   WIXELA INHUB 250-50 MCG/ACT AEPB, USE 1 INHALATION TWICE A DAY  Current  Outpatient Medications (Analgesics):    aspirin EC 81 MG tablet, Take 81 mg by mouth daily. Swallow whole.   ibuprofen  (ADVIL ) 600 MG tablet, Take 600 mg by mouth every 6 (six) hours as needed.   Current Outpatient Medications (Other):    amitriptyline  (ELAVIL ) 25 MG tablet, TAKE 1 TABLET AT BEDTIME   atropine 1 % ophthalmic solution, Place 1 drop into the right eye daily.   finasteride  (PROSCAR ) 5 MG tablet, TAKE 1 TABLET DAILY   methocarbamol  (ROBAXIN ) 500 MG tablet, Take by mouth every 6 (six) hours as needed.   Multiple Vitamin (MULTIVITAMIN) tablet, Take 1 tablet by mouth daily.   omeprazole  (PRILOSEC) 40 MG capsule, TAKE 1 CAPSULE DAILY   tamsulosin  (FLOMAX ) 0.4 MG CAPS capsule,  TAKE 2 CAPSULES DAILY AFTER BREAKFAST   Reviewed prior external information including notes and imaging from  primary care provider As well as notes that were available from care everywhere and other healthcare systems.  Past medical history, social, surgical and family history all reviewed in electronic medical record.  No pertanent information unless stated regarding to the chief complaint.   Review of Systems:  No headache, visual changes, nausea, vomiting, diarrhea, constipation, dizziness, abdominal pain, skin rash, fevers, chills, night sweats, weight loss, swollen lymph nodes, body aches, joint swelling, chest pain, shortness of breath, mood changes. POSITIVE muscle aches  Objective  Blood pressure 116/70, pulse 81, height 5\' 6"  (1.676 m), weight 178 lb (80.7 kg), SpO2 96%.   General: No apparent distress alert and oriented x3 mood and affect normal, dressed appropriately.  HEENT: Pupils equal, extraocular movements intact  Respiratory: Patient's speak in full sentences and does not appear short of breath  Cardiovascular: No lower extremity edema, non tender, no erythema  Hip exam shows some limited range of motion with internal range of motion.  Mild tightness with FABER.  Negative straight leg  test noted.  Still some tenderness over the gluteal area      Impression and Recommendations:    The above documentation has been reviewed and is accurate and complete [place]ah Buckalew M Cormac Wint, DO

## 2023-10-30 ENCOUNTER — Encounter: Payer: Self-pay | Admitting: Cardiology

## 2023-10-31 ENCOUNTER — Other Ambulatory Visit: Payer: Self-pay | Admitting: Family Medicine

## 2023-11-03 ENCOUNTER — Encounter: Payer: Self-pay | Admitting: Family Medicine

## 2023-11-03 ENCOUNTER — Ambulatory Visit (INDEPENDENT_AMBULATORY_CARE_PROVIDER_SITE_OTHER): Admitting: Family Medicine

## 2023-11-03 VITALS — BP 116/70 | HR 81 | Ht 66.0 in | Wt 178.0 lb

## 2023-11-03 DIAGNOSIS — M1612 Unilateral primary osteoarthritis, left hip: Secondary | ICD-10-CM | POA: Diagnosis not present

## 2023-11-03 NOTE — Assessment & Plan Note (Signed)
 Known arthritis of the hip noted but patient's pain still seems to be more on the lateral aspect of the hip.  We discussed with patient about icing regimen and home exercises still.  Patient is making progress overall.  Discussed which activities to do and which ones to avoid.  Increase activity slowly.  Follow-up with me again in 6 to 8 weeks otherwise.

## 2023-11-03 NOTE — Telephone Encounter (Signed)
 BP intermittently elevated but overall looks okay, no changes recommended at this time

## 2023-11-03 NOTE — Patient Instructions (Signed)
 Good to see you! Continue with PT Ice before bed for 20 mins See you again in 8-10 weeks

## 2023-11-04 ENCOUNTER — Encounter (HOSPITAL_BASED_OUTPATIENT_CLINIC_OR_DEPARTMENT_OTHER): Payer: Self-pay | Admitting: Physical Therapy

## 2023-11-04 ENCOUNTER — Ambulatory Visit (HOSPITAL_BASED_OUTPATIENT_CLINIC_OR_DEPARTMENT_OTHER): Admitting: Physical Therapy

## 2023-11-04 DIAGNOSIS — M25652 Stiffness of left hip, not elsewhere classified: Secondary | ICD-10-CM | POA: Diagnosis not present

## 2023-11-04 DIAGNOSIS — M5459 Other low back pain: Secondary | ICD-10-CM | POA: Diagnosis not present

## 2023-11-04 DIAGNOSIS — M25552 Pain in left hip: Secondary | ICD-10-CM | POA: Diagnosis not present

## 2023-11-04 DIAGNOSIS — M6281 Muscle weakness (generalized): Secondary | ICD-10-CM | POA: Diagnosis not present

## 2023-11-04 NOTE — Therapy (Signed)
 OUTPATIENT PHYSICAL THERAPY LOWER EXTREMITY EVALUATION   Patient Name: Jonathon Johnson MRN: [id number] DOB:July 16, 1945, 78 y.o., male Today's Date: 11/04/2023  END OF SESSION:  PT End of Session - 11/04/23 0935     Visit Number 9    Number of Visits 16    Date for PT Re-Evaluation 11/25/23    PT Start Time 0930    PT Stop Time 1012    PT Time Calculation (min) 42 min    Activity Tolerance Patient tolerated treatment well;No increased pain    Behavior During Therapy [facility] for tasks assessed/performed                   Past Medical History:  Diagnosis Date   Asthma    Chicken pox    GERD (gastroesophageal reflux disease)    Hyperlipidemia 11/04/2016   Hypertension 07/19/2018   Past Surgical History:  Procedure Laterality Date   CATARACT EXTRACTION Bilateral    02/2023   CHOLECYSTECTOMY     TRANSFORAMINAL LUMBAR INTERBODY FUSION (TLIF) WITH PEDICLE SCREW FIXATION 1 LEVEL Right 09/25/2021   Procedure: RIGHT-SIDED TRANSFORAMINAL LUMBAR INTERBODY FUSION AND DECOMPRESSION LUMBAR 4-  LUMBAR 5 WITH INSTRUMENTATION AND ALLOGRAFT;  Surgeon: Virl Grimes, MD;  Location: [facility] OR;  Service: Orthopedics;  Laterality: Right;   Patient Active Problem List   Diagnosis Date Noted   Arthritis of left hip 09/01/2023   Greater trochanteric bursitis of left hip 09/01/2023   Hyponatremia 08/07/2023   Radiculopathy 09/25/2021   Post herpetic neuralgia 11/08/2020   Chronic bilateral low back pain with bilateral sciatica 08/28/2020   Alcohol  use 03/16/2020   Thumb pain, right 06/28/2019   Arthralgia of right temporomandibular joint 12/20/2018   Diarrhea 12/20/2018   Screening for diabetes mellitus 07/19/2018   Insomnia 07/19/2018   Gastroesophageal reflux disease 07/19/2018   Benign prostatic hyperplasia with urinary hesitancy 07/19/2018   Essential hypertension 07/19/2018   Elevated LDL cholesterol level 07/19/2018   Cough 07/19/2018   Mild reactive airways disease 07/19/2018     PCP: Tonna Frederic, MD  REFERRING PROVIDER: Isidro Margo, DO  REFERRING DIAG:  Diagnosis  M25.552 (ICD-10-CM) - Left hip pain    THERAPY DIAG:  Pain in left hip  Muscle weakness (generalized)  Stiffness of left hip, not elsewhere classified  Rationale for Evaluation and Treatment: Rehabilitation  ONSET DATE: Before Christmas  SUBJECTIVE:   SUBJECTIVE STATEMENT: Pt states that the L hip was sore for multiple days since last session. Pt states that the soreness has decreased since. Pt does feel limited in ability to SL squatting.    Eval: Pt came in with left hip pain. Received recent injection for left trochanteric bursitis. Pt states pain and discomfort has been the same since and before the injection. Feels the pain most when walking and often has issues with endurance and balance. Is retired but wants to feel more comfortable ambulating community distances and being on his feet for longer periods of time. Pt states pain at worst is 2/10 and feels like a toothache. Pain is located deep in the hip and sometimes feels spots on the lateral hip.   PERTINENT HISTORY: (2022) LBP with neuro symptoms radiating into bilateral lower legs,  Disc space narrowing is noted throughout the lumbar spine. Mild anterolisthesis of a degenerative nature is noted at L4-5. Multilevel osteophytic changes are seen worst on the left at L1-2. No soft tissue abnormality is noted. PAIN:  Are you having pain? No: NPRS scale: 0/10 Pain location: deep  Pain description: achy, toothache Aggravating factors: sleeping on left side Relieving factors: rest  PRECAUTIONS: None  RED FLAGS: None   WEIGHT BEARING RESTRICTIONS: No  FALLS:  Has patient fallen in last 6 months? Yes. Number of falls 2 last one was on Monday  LIVING ENVIRONMENT:   OCCUPATION: retired  PLOF: Independent  PATIENT GOALS: Find out what can help him feel better  NEXT MD VISIT: 5/27  OBJECTIVE:  Note:  Objective measures were completed at Evaluation unless otherwise noted.  DIAGNOSTIC FINDINGS:  FINDINGS: 2022 Five lumbar type vertebral bodies are well visualized. Vertebral body height is well maintained. Disc space narrowing is noted throughout the lumbar spine. Mild anterolisthesis of a degenerative nature is noted at L4-5. Multilevel osteophytic changes are seen worst on the left at L1-2. No soft tissue abnormality is noted.   IMPRESSION: Multilevel degenerative change with anterolisthesis at L4-5.  PATIENT SURVEYS:  LEFS 49/80  COGNITION: Overall cognitive status: Within functional limits for tasks assessed     SENSATION: WFL  POSTURE: rounded shoulders, forward head, and flexed trunk   PALPATION: TTP over lateral hip, IT band, glute region  LOWER EXTREMITY ROM:  Passive ROM Right eval Left eval  Hip flexion  limited  Hip extension    Hip abduction    Hip adduction    Hip internal rotation  limited  Hip external rotation  [facility]  Knee flexion    Knee extension    Ankle dorsiflexion    Ankle plantarflexion    Ankle inversion    Ankle eversion     (Blank rows = not tested)    LOWER EXTREMITY MMT:  MMT Right eval Left eval  Hip flexion 33.6 33.5  Hip extension    Hip abduction 39.2 27.3  Hip adduction    Hip internal rotation    Hip external rotation    Knee flexion    Knee extension 51.7 34.7  Ankle dorsiflexion    Ankle plantarflexion    Ankle inversion    Ankle eversion     (Blank rows = not tested)  LOWER EXTREMITY SPECIAL TESTS:  Hip special tests: FADIR: negative  FUNCTIONAL TESTS:  Feet together eyes closed: moderate swaying Tandem: moderate swaying  GAIT: 68ft with no AD gait   Moderate trendelenburg and avoidance                                                                                                                                Treatment Date:  5/28  Nustep lvl 4 5 min  STM L posterior hip  Prone quad stretch  30s 3x Piriformis stretch 30s 3x L knee flexion gapping 20x 2s hold Staggered stance squat GTB at knees 3x8 SL shuttle leg press 75lbs 3x8 4" lateral step down 2x8  RPE 6-7 with exercise   5/22  Nustep lvl 5 5 min  Stretch 30s 3x: prone quad, pirformis, supine HS  4" lateral step down 3x8  SL squat to table with R heel touch 4x6 S/L shuttle leg press 50lbs 3x8  Tandem walk hallway 3x   5/20   STM L quad and L posterior hip glutes  Prone quad stretch 30s 3x STS with mid thigh height with GTB at knees 3x8 Sidestepping in hall GTB at knees 3x8 8" box step up 3x8  Gym exercise and frequency with HEP updates   5/15  STM L quad and L posterior hip glutes  Prone quad stretch 30s 3x Supine piriformis with knee bent 30s 3x Half kneeling hip flexor stretch 30s 3x Anatomy of condition Self STM technique, gym exercise modifications    Treatment                            5/13: Blank lines following charge title = not provided on this treatment date.   Manual:  TPDN No Roller Rt quads & ITB There-ex: Mod thomas stretch with strap Bridge with ball bw knees, + chest press Marching bridge Hip hinge to sit<>stand Lateral resisted stepping at cable machine There-Act:  Self Care:  Nuro-Re-ed: Improving arm swing in gait Step with fwd arm swing- cues for glut set Gait Training:   5/8 Manual: All PROM performed with distraction to reduce pain and improve movement Trigger Point Dry Needling  Subsequent Treatment: Instructions reviewed, if requested by the patient, prior to subsequent dry needling treatment.   Patient Verbal Consent Given: Yes Education Handout Provided: Previously Provided Muscles Treated: R glute 3x using a .30x50 needle Electrical Stimulation Performed: No Treatment Response/Outcome: strong muscle twitch response   There-ex: Nustep lvl 3 Leg ext 3x10 25lbs Leg press 3x10 40lbs  Cybex row machine 3x10 50lbs   Figure 4 stretch 3x30sec      Treatment                            4/30: Blank lines following charge title = not provided on this treatment date.   Manual:  TPDN YES Trigger Point Dry Needling  Subsequent Treatment: Instructions provided previously at initial dry needling treatment. - previously needled in [facility] system  Patient Verbal Consent Given: Yes Education Handout Provided: Previously Provided Muscles Treated: Lt Glut med/min, TFL Electrical Stimulation Performed: No Treatment Response/Outcome: twitch with decreased concordant pain  There-ex: Supine piriformis stretch Sidelying clam Sidelying hip circles Seated figure 4 Standing quad stretch- foot on table, hands on chair SLS with toe prop There-Act:  Self Care:  Nuro-Re-ed:  Gait Training: Glut set at toe off to decrease trunk flexion in Lt stance phase     PATIENT EDUCATION:  Education details: HEP, symptom management, self care Person educated: Patient Education method: Explanation, Demonstration, Tactile cues, Verbal cues, and Handouts Education comprehension: verbalized understanding and returned demonstration  HOME EXERCISE PROGRAM: Access Code:  [email or web address] URL: [email or web address] Date: 09/30/2023 Prepared by: Lonzell Robin  Exercises - Standing Upper Trapezius Mobilization with Small Ball  - 1 x daily - 7 x weekly - 3 sets - 10 reps - Theracane Under Arm   - 1 x daily - 7 x weekly - 3 sets - 10 reps - Seated Long Arc Quad  - 1 x daily - 7 x weekly - 3 sets - 10 reps - Seated Hip Abduction  - 1 x daily - 7 x weekly - 3 sets - 10 reps  ASSESSMENT:  CLINICAL IMPRESSION: Given increased DOMS  since last session, volume and intensity of exercise reduced with RPE set at 6-7. Pt able to perform exercise without pain and only fatigue today in SL or staggered positions. Pt does have limited hip hinge in SL motions which may be contributing to knee pain with SL exercise. Plan to revisit  hip hinge movement. Plan to continue with L quad and hip strengthening exercise as tolerated. Consider use of farmer's walk/carry to aid in LE stability and lumbopelvic engagement. Pt will continue to benefit from skilled physical therapy to improve community ambulation and functional strength.   Eval: Patient is a 78 y.o. male who was seen today for physical therapy evaluation and treatment for left hip pain. Balance and gait were assessed noting diminished Objective measurements were taken and noted with strength deficits in hip abduction and knee extension. Trigger points were spotted in lateral hip near greater trochanter, IT band, and upper glute region. Pt was educated on at home soft tissue mobilizations with theracane and tennis ball. Exercises were performed for general strengthening. Education was provided on exercises for proper muscle activation and posture. Pt can progress balance, strength, and gait endurance. Pt will continue to benefit from skilled physical therapy to improve community ambulation and functional strength.   OBJECTIVE IMPAIRMENTS: Abnormal gait, decreased activity tolerance, decreased balance, decreased coordination, decreased endurance, decreased mobility, difficulty walking, decreased ROM, decreased strength, hypomobility, and impaired flexibility.   ACTIVITY LIMITATIONS: carrying, lifting, bending, standing, squatting, sleeping, stairs, bathing, toileting, dressing, and locomotion level  PARTICIPATION LIMITATIONS: cleaning, laundry, shopping, community activity, and yard work  PERSONAL FACTORS: 1-2 comorbidities: Lumbar surgery, balance are also affecting patient's functional outcome.   REHAB POTENTIAL: Good  CLINICAL DECISION MAKING: Evolving/moderate complexity fluctuation in pain levels with ambulation and balance  EVALUATION COMPLEXITY: Moderate   GOALS: Goals reviewed with patient? Yes  SHORT TERM GOALS: Target date: 10/28/2023   Pt will increase hip  abduction strength by 5lbs for community ambulation. Baseline: Goal status: INITIAL  2.  Pt will increase knee extension strength by 5lbs for community ambulation.  Baseline:  Goal status: INITIAL  3.  Pt will be able to complete tandem stance with eyes closed with moderate swaying. Baseline:  Goal status: INITIAL    LONG TERM GOALS: Target date: 11/25/2023    Pt will increase hip abduction strength by 5lbs for community ambulation. Baseline:  Goal status: INITIAL  2.  Pt will increase knee extension strength by 5lbs for community ambulation.  Baseline:  Goal status: INITIAL  3.  Pt will be able to complete tandem stance with minimal swaying.  Baseline:  Goal status: INITIAL  4.  Pt will be able to ambulate community distances with increased endurance more than . Baseline:  Goal status: INITIAL    PLAN:  PT FREQUENCY: 1-2x/week  PT DURATION: 8 weeks  PLANNED INTERVENTIONS: [id number]- PT Re-evaluation, 97110-Therapeutic exercises, 97530- Therapeutic activity, [id number]- Neuromuscular re-education, 97535- Self Care, [id number]- Manual therapy, [id number]- Gait training, [phone]- Aquatic Therapy, [phone]- Electrical stimulation (unattended), [id number]- Electrical stimulation (manual), and 97012- Traction (mechanical)  PLAN FOR NEXT SESSION: Expand HEP, work on balance and gait training.    Silver Dross PT, DPT 11/04/23 10:17 AM

## 2023-11-10 ENCOUNTER — Ambulatory Visit (HOSPITAL_BASED_OUTPATIENT_CLINIC_OR_DEPARTMENT_OTHER): Attending: Family Medicine | Admitting: Physical Therapy

## 2023-11-10 ENCOUNTER — Encounter (HOSPITAL_BASED_OUTPATIENT_CLINIC_OR_DEPARTMENT_OTHER): Payer: Self-pay | Admitting: Physical Therapy

## 2023-11-10 DIAGNOSIS — M25652 Stiffness of left hip, not elsewhere classified: Secondary | ICD-10-CM

## 2023-11-10 DIAGNOSIS — M6281 Muscle weakness (generalized): Secondary | ICD-10-CM | POA: Diagnosis not present

## 2023-11-10 DIAGNOSIS — M5459 Other low back pain: Secondary | ICD-10-CM | POA: Diagnosis not present

## 2023-11-10 DIAGNOSIS — M25552 Pain in left hip: Secondary | ICD-10-CM | POA: Diagnosis not present

## 2023-11-10 NOTE — Therapy (Signed)
 OUTPATIENT PHYSICAL THERAPY LOWER EXTREMITY EVALUATION  Progress Note Reporting Period 09/30/23 to 11/10/23   See note below for Objective Data and Assessment of Progress/Goals.       Patient Name: Jonathon Johnson MRN: [id number] DOB:09/12/1945, 78 y.o., male Today's Date: 11/10/2023  END OF SESSION:  PT End of Session - 11/10/23 0857     Visit Number 10    Number of Visits 16    Date for PT Re-Evaluation 11/25/23    PT Start Time 0846    PT Stop Time 0930    PT Time Calculation (min) 44 min    Activity Tolerance Patient tolerated treatment well;No increased pain    Behavior During Therapy [facility] for tasks assessed/performed                    Past Medical History:  Diagnosis Date   Asthma    Chicken pox    GERD (gastroesophageal reflux disease)    Hyperlipidemia 11/04/2016   Hypertension 07/19/2018   Past Surgical History:  Procedure Laterality Date   CATARACT EXTRACTION Bilateral    02/2023   CHOLECYSTECTOMY     TRANSFORAMINAL LUMBAR INTERBODY FUSION (TLIF) WITH PEDICLE SCREW FIXATION 1 LEVEL Right 09/25/2021   Procedure: RIGHT-SIDED TRANSFORAMINAL LUMBAR INTERBODY FUSION AND DECOMPRESSION LUMBAR 4-  LUMBAR 5 WITH INSTRUMENTATION AND ALLOGRAFT;  Surgeon: Virl Grimes, MD;  Location: [facility] OR;  Service: Orthopedics;  Laterality: Right;   Patient Active Problem List   Diagnosis Date Noted   Arthritis of left hip 09/01/2023   Greater trochanteric bursitis of left hip 09/01/2023   Hyponatremia 08/07/2023   Radiculopathy 09/25/2021   Post herpetic neuralgia 11/08/2020   Chronic bilateral low back pain with bilateral sciatica 08/28/2020   Alcohol  use 03/16/2020   Thumb pain, right 06/28/2019   Arthralgia of right temporomandibular joint 12/20/2018   Diarrhea 12/20/2018   Screening for diabetes mellitus 07/19/2018   Insomnia 07/19/2018   Gastroesophageal reflux disease 07/19/2018   Benign prostatic hyperplasia with urinary hesitancy 07/19/2018   Essential  hypertension 07/19/2018   Elevated LDL cholesterol level 07/19/2018   Cough 07/19/2018   Mild reactive airways disease 07/19/2018    PCP: Tonna Frederic, MD  REFERRING PROVIDER: Isidro Margo, DO  REFERRING DIAG:  Diagnosis  M25.552 (ICD-10-CM) - Left hip pain    THERAPY DIAG:  Pain in left hip  Muscle weakness (generalized)  Stiffness of left hip, not elsewhere classified  Other low back pain  Rationale for Evaluation and Treatment: Rehabilitation  ONSET DATE: Before Christmas  SUBJECTIVE:   SUBJECTIVE STATEMENT: Pt states he is doing well today. He possibly did too much with the weight machines in the gym but recovered by the next day. Pt was gardening and walking in an uneven yard which may have also irritated. Pt feels that he is progressing his capacity to do things and feels more stable overall. Pt feels 50-60% improvement in function since starting therapy.    Eval: Pt came in with left hip pain. Received recent injection for left trochanteric bursitis. Pt states pain and discomfort has been the same since and before the injection. Feels the pain most when walking and often has issues with endurance and balance. Is retired but wants to feel more comfortable ambulating community distances and being on his feet for longer periods of time. Pt states pain at worst is 2/10 and feels like a toothache. Pain is located deep in the hip and sometimes feels spots on the lateral hip.  PERTINENT HISTORY: (2022) LBP with neuro symptoms radiating into bilateral lower legs,  Disc space narrowing is noted throughout the lumbar spine. Mild anterolisthesis of a degenerative nature is noted at L4-5. Multilevel osteophytic changes are seen worst on the left at L1-2. No soft tissue abnormality is noted. PAIN:  Are you having pain? No: NPRS scale: 0/10 Pain location: deep Pain description: achy, toothache Aggravating factors: sleeping on left side Relieving factors:  rest  PRECAUTIONS: None  RED FLAGS: None   WEIGHT BEARING RESTRICTIONS: No  FALLS:  Has patient fallen in last 6 months? Yes. Number of falls 2 last one was on Monday  LIVING ENVIRONMENT:   OCCUPATION: retired  PLOF: Independent  PATIENT GOALS: Find out what can help him feel better  NEXT MD VISIT: 5/27  OBJECTIVE:  Note: Objective measures were completed at Evaluation unless otherwise noted.  DIAGNOSTIC FINDINGS:  FINDINGS: 2022 Five lumbar type vertebral bodies are well visualized. Vertebral body height is well maintained. Disc space narrowing is noted throughout the lumbar spine. Mild anterolisthesis of a degenerative nature is noted at L4-5. Multilevel osteophytic changes are seen worst on the left at L1-2. No soft tissue abnormality is noted.   IMPRESSION: Multilevel degenerative change with anterolisthesis at L4-5.  PATIENT SURVEYS:  LEFS 49/80 6/3 LEFS: Lower Extremity Functional Score: 64 / 80 = 80.0 %  COGNITION: Overall cognitive status: Within functional limits for tasks assessed     SENSATION: WFL  POSTURE: rounded shoulders, forward head, and flexed trunk   PALPATION: TTP over lateral hip  LOWER EXTREMITY ROM:  Passive ROM Right eval Left eval L 6/3d  Hip flexion  limited 120 no pain  Hip extension     Hip abduction     Hip adduction     Hip internal rotation  limited 30  Hip external rotation  [facility] 45  Knee flexion     Knee extension     Ankle dorsiflexion     Ankle plantarflexion     Ankle inversion     Ankle eversion      (Blank rows = not tested)    LOWER EXTREMITY MMT:  MMT Right eval R 6/3 Left eval L 6/3  Hip flexion 33.6 50.4 33.5 48.7  Hip extension      Hip abduction 39.2 37.7 27.3 35.2  Hip adduction      Hip internal rotation      Hip external rotation      Knee flexion      Knee extension 51.7 54.1 34.7 53.9  Ankle dorsiflexion      Ankle plantarflexion      Ankle inversion      Ankle eversion        (Blank rows = not tested)                                 Tandem stance: 10s  Treatment Date:  6/3  STM  L glute and lumbar paraspinals  UPA grade III L L2-5  - Child's Pose Stretch  - 1 x daily - 7 x weekly - 1 sets - 15 reps - 2 hold - Seated Quadratus Lumborum Stretch with Arm Overhead  - 2 x daily - 7 x weekly - 1 sets - 3 reps - 30 hold - Standing Sidebends  - 1 x daily - 7 x weekly - 2 sets - 10 reps   Exam findings, HEP progression, anatomy of condition, DOMS expectations, massage therapy options  5/28  Nustep lvl 4 5 min  STM L posterior hip  Prone quad stretch 30s 3x Piriformis stretch 30s 3x L knee flexion gapping 20x 2s hold Staggered stance squat GTB at knees 3x8 SL shuttle leg press 75lbs 3x8 4" lateral step down 2x8  RPE 6-7 with exercise   5/22  Nustep lvl 5 5 min  Stretch 30s 3x: prone quad, pirformis, supine HS  4" lateral step down 3x8 SL squat to table with R heel touch 4x6 S/L shuttle leg press 50lbs 3x8  Tandem walk hallway 3x   5/20   STM L quad and L posterior hip glutes  Prone quad stretch 30s 3x STS with mid thigh height with GTB at knees 3x8 Sidestepping in hall GTB at knees 3x8 8" box step up 3x8  Gym exercise and frequency with HEP updates   5/15  STM L quad and L posterior hip glutes  Prone quad stretch 30s 3x Supine piriformis with knee bent 30s 3x Half kneeling hip flexor stretch 30s 3x Anatomy of condition Self STM technique, gym exercise modifications    Treatment                            5/13: Blank lines following charge title = not provided on this treatment date.   Manual:  TPDN No Roller Rt quads & ITB There-ex: Mod thomas stretch with strap Bridge with ball bw knees, + chest press Marching bridge Hip hinge to sit<>stand Lateral resisted stepping at cable machine There-Act:  Self  Care:  Nuro-Re-ed: Improving arm swing in gait Step with fwd arm swing- cues for glut set Gait Training:   5/8 Manual: All PROM performed with distraction to reduce pain and improve movement Trigger Point Dry Needling  Subsequent Treatment: Instructions reviewed, if requested by the patient, prior to subsequent dry needling treatment.   Patient Verbal Consent Given: Yes Education Handout Provided: Previously Provided Muscles Treated: R glute 3x using a .30x50 needle Electrical Stimulation Performed: No Treatment Response/Outcome: strong muscle twitch response   There-ex: Nustep lvl 3 Leg ext 3x10 25lbs Leg press 3x10 40lbs  Cybex row machine 3x10 50lbs  Figure 4 stretch 3x30sec      Treatment                            4/30: Blank lines following charge title = not provided on this treatment date.   Manual:  TPDN YES Trigger Point Dry Needling  Subsequent Treatment: Instructions provided previously at initial dry needling treatment. - previously needled in [facility] system  Patient Verbal Consent Given: Yes Education Handout Provided: Previously Provided Muscles Treated: Lt Glut med/min, TFL Electrical Stimulation Performed: No Treatment Response/Outcome: twitch with decreased concordant pain  There-ex: Supine piriformis stretch Sidelying clam Sidelying hip circles Seated figure 4 Standing  quad stretch- foot on table, hands on chair SLS with toe prop There-Act:  Self Care:  Nuro-Re-ed:  Gait Training: Glut set at toe off to decrease trunk flexion in Lt stance phase     PATIENT EDUCATION:  Education details: HEP, symptom management Person educated: Patient Education method: Explanation, Demonstration, Tactile cues, Verbal cues, and Handouts Education comprehension: verbalized understanding and returned demonstration  HOME EXERCISE PROGRAM: Access Code:  [email or web address] URL: [email or web address] Date:  09/30/2023 Prepared by: Lonzell Robin    ASSESSMENT:  CLINICAL IMPRESSION: Pt with improvement in current fucntional status in subjective and objective measures as demonstrated above. Pt reports 50% improvement in community mobility and function with ADL but still feels limited with lifting, walking, and squatting capacity. Pt still with lingering L knee and hip pain but has greatly improvement since initial assessment. See below for specific goals. HEP updated today with lumbar mobility as pt does respond well with manual therapy.  Consider use of farmer's walk/carry or weighted SB to aid in LE stability and lumbopelvic engagement. Pt will continue to benefit from skilled physical therapy to improve community ambulation and functional strength.   Eval: Patient is a 78 y.o. male who was seen today for physical therapy evaluation and treatment for left hip pain. Balance and gait were assessed noting diminished Objective measurements were taken and noted with strength deficits in hip abduction and knee extension. Trigger points were spotted in lateral hip near greater trochanter, IT band, and upper glute region. Pt was educated on at home soft tissue mobilizations with theracane and tennis ball. Exercises were performed for general strengthening. Education was provided on exercises for proper muscle activation and posture. Pt can progress balance, strength, and gait endurance. Pt will continue to benefit from skilled physical therapy to improve community ambulation and functional strength.   OBJECTIVE IMPAIRMENTS: Abnormal gait, decreased activity tolerance, decreased balance, decreased coordination, decreased endurance, decreased mobility, difficulty walking, decreased ROM, decreased strength, hypomobility, and impaired flexibility.   ACTIVITY LIMITATIONS: carrying, lifting, bending, standing, squatting, sleeping, stairs, bathing, toileting, dressing, and locomotion level  PARTICIPATION LIMITATIONS:  cleaning, laundry, shopping, community activity, and yard work  PERSONAL FACTORS: 1-2 comorbidities: Lumbar surgery, balance are also affecting patient's functional outcome.   REHAB POTENTIAL: Good  CLINICAL DECISION MAKING: Evolving/moderate complexity fluctuation in pain levels with ambulation and balance  EVALUATION COMPLEXITY: Moderate   GOALS: Goals reviewed with patient? Yes  SHORT TERM GOALS: Target date: 10/28/2023   Pt will increase hip abduction strength by 5lbs for community ambulation. Baseline: Goal status: MET  2.  Pt will increase knee extension strength by 5lbs for community ambulation.  Baseline:  Goal status: MET  3.  Pt will be able to complete tandem stance with eyes closed with moderate swaying. Baseline:  Goal status: MET    LONG TERM GOALS: Target date: 11/25/2023    Pt will increase hip abduction strength by 5lbs for community ambulation. Baseline:  Goal status: MEt  2.  Pt will increase knee extension strength by 5lbs for community ambulation.  Baseline:  Goal status: MET  3.  Pt will be able to complete tandem stance with minimal swaying.  Baseline:  Goal status: ongoing  4.  Pt will be able to ambulate community distances with increased endurance more than . Baseline:  Goal status: ongoing    PLAN:  PT FREQUENCY: 1-2x/week  PT DURATION: 8 weeks  PLANNED INTERVENTIONS: 97164- PT Re-evaluation, 97110-Therapeutic exercises, 97530- Therapeutic activity, [id number]- Neuromuscular re-education, 97535- Self Care, 16109- Manual therapy,  21308- Gait training, 65784- Aquatic Therapy, [phone]- Electrical stimulation (unattended), [id number]- Electrical stimulation (manual), and [id number]- Traction (mechanical)  PLAN FOR NEXT SESSION: Expand HEP, work on balance and gait training.    Silver Dross PT, DPT 11/10/23 9:33 AM

## 2023-11-12 ENCOUNTER — Encounter (HOSPITAL_BASED_OUTPATIENT_CLINIC_OR_DEPARTMENT_OTHER): Payer: Self-pay | Admitting: Physical Therapy

## 2023-11-12 ENCOUNTER — Ambulatory Visit (HOSPITAL_BASED_OUTPATIENT_CLINIC_OR_DEPARTMENT_OTHER): Admitting: Physical Therapy

## 2023-11-12 DIAGNOSIS — M6281 Muscle weakness (generalized): Secondary | ICD-10-CM | POA: Diagnosis not present

## 2023-11-12 DIAGNOSIS — M25552 Pain in left hip: Secondary | ICD-10-CM | POA: Diagnosis not present

## 2023-11-12 DIAGNOSIS — M25652 Stiffness of left hip, not elsewhere classified: Secondary | ICD-10-CM

## 2023-11-12 DIAGNOSIS — M5459 Other low back pain: Secondary | ICD-10-CM

## 2023-11-12 NOTE — Therapy (Signed)
 OUTPATIENT PHYSICAL THERAPY LOWER EXTREMITY EVALUATION  Progress Note Reporting Period 09/30/23 to 11/10/23   See note below for Objective Data and Assessment of Progress/Goals.       Patient Name: Jonathon Johnson MRN: [id number] DOB:29-Oct-1945, 78 y.o., male Today's Date: 11/12/2023  END OF SESSION:  PT End of Session - 11/12/23 1136     Visit Number 11    Number of Visits 16    Date for PT Re-Evaluation 11/25/23    PT Start Time 1440    PT Stop Time 1518    PT Time Calculation (min) 38 min    Activity Tolerance Patient tolerated treatment well;No increased pain    Behavior During Therapy [facility] for tasks assessed/performed                    Past Medical History:  Diagnosis Date   Asthma    Chicken pox    GERD (gastroesophageal reflux disease)    Hyperlipidemia 11/04/2016   Hypertension 07/19/2018   Past Surgical History:  Procedure Laterality Date   CATARACT EXTRACTION Bilateral    02/2023   CHOLECYSTECTOMY     TRANSFORAMINAL LUMBAR INTERBODY FUSION (TLIF) WITH PEDICLE SCREW FIXATION 1 LEVEL Right 09/25/2021   Procedure: RIGHT-SIDED TRANSFORAMINAL LUMBAR INTERBODY FUSION AND DECOMPRESSION LUMBAR 4-  LUMBAR 5 WITH INSTRUMENTATION AND ALLOGRAFT;  Surgeon: Virl Grimes, MD;  Location: [facility] OR;  Service: Orthopedics;  Laterality: Right;   Patient Active Problem List   Diagnosis Date Noted   Arthritis of left hip 09/01/2023   Greater trochanteric bursitis of left hip 09/01/2023   Hyponatremia 08/07/2023   Radiculopathy 09/25/2021   Post herpetic neuralgia 11/08/2020   Chronic bilateral low back pain with bilateral sciatica 08/28/2020   Alcohol  use 03/16/2020   Thumb pain, right 06/28/2019   Arthralgia of right temporomandibular joint 12/20/2018   Diarrhea 12/20/2018   Screening for diabetes mellitus 07/19/2018   Insomnia 07/19/2018   Gastroesophageal reflux disease 07/19/2018   Benign prostatic hyperplasia with urinary hesitancy 07/19/2018   Essential  hypertension 07/19/2018   Elevated LDL cholesterol level 07/19/2018   Cough 07/19/2018   Mild reactive airways disease 07/19/2018    PCP: Tonna Frederic, MD  REFERRING PROVIDER: Isidro Margo, DO  REFERRING DIAG:  Diagnosis  M25.552 (ICD-10-CM) - Left hip pain    THERAPY DIAG:  Pain in left hip  Muscle weakness (generalized)  Stiffness of left hip, not elsewhere classified  Other low back pain  Rationale for Evaluation and Treatment: Rehabilitation  ONSET DATE: Before Christmas  SUBJECTIVE:   SUBJECTIVE STATEMENT: The patient reports some soreness down his lateral leg today. Overall he feels like he is doing well. He does feel like he has continued soreness at night. He reports at least 1x a night it wakes him up.   Eval: Pt came in with left hip pain. Received recent injection for left trochanteric bursitis. Pt states pain and discomfort has been the same since and before the injection. Feels the pain most when walking and often has issues with endurance and balance. Is retired but wants to feel more comfortable ambulating community distances and being on his feet for longer periods of time. Pt states pain at worst is 2/10 and feels like a toothache. Pain is located deep in the hip and sometimes feels spots on the lateral hip.   PERTINENT HISTORY: (2022) LBP with neuro symptoms radiating into bilateral lower legs,  Disc space narrowing is noted throughout the lumbar spine. Mild anterolisthesis of  a degenerative nature is noted at L4-5. Multilevel osteophytic changes are seen worst on the left at L1-2. No soft tissue abnormality is noted. PAIN:  Are you having pain? No: NPRS scale: 0/10 Pain location: deep Pain description: achy, toothache Aggravating factors: sleeping on left side Relieving factors: rest  PRECAUTIONS: None  RED FLAGS: None   WEIGHT BEARING RESTRICTIONS: No  FALLS:  Has patient fallen in last 6 months? Yes. Number of falls 2 last one  was on Monday  LIVING ENVIRONMENT:   OCCUPATION: retired  PLOF: Independent  PATIENT GOALS: Find out what can help him feel better  NEXT MD VISIT: 5/27  OBJECTIVE:  Note: Objective measures were completed at Evaluation unless otherwise noted.  DIAGNOSTIC FINDINGS:  FINDINGS: 2022 Five lumbar type vertebral bodies are well visualized. Vertebral body height is well maintained. Disc space narrowing is noted throughout the lumbar spine. Mild anterolisthesis of a degenerative nature is noted at L4-5. Multilevel osteophytic changes are seen worst on the left at L1-2. No soft tissue abnormality is noted.   IMPRESSION: Multilevel degenerative change with anterolisthesis at L4-5.  PATIENT SURVEYS:  LEFS 49/80 6/3 LEFS: Lower Extremity Functional Score: 64 / 80 = 80.0 %  COGNITION: Overall cognitive status: Within functional limits for tasks assessed     SENSATION: WFL  POSTURE: rounded shoulders, forward head, and flexed trunk   PALPATION: TTP over lateral hip  LOWER EXTREMITY ROM:  Passive ROM Right eval Left eval L 6/3d  Hip flexion  limited 120 no pain  Hip extension     Hip abduction     Hip adduction     Hip internal rotation  limited 30  Hip external rotation  [facility] 45  Knee flexion     Knee extension     Ankle dorsiflexion     Ankle plantarflexion     Ankle inversion     Ankle eversion      (Blank rows = not tested)    LOWER EXTREMITY MMT:  MMT Right eval R 6/3 Left eval L 6/3  Hip flexion 33.6 50.4 33.5 48.7  Hip extension      Hip abduction 39.2 37.7 27.3 35.2  Hip adduction      Hip internal rotation      Hip external rotation      Knee flexion      Knee extension 51.7 54.1 34.7 53.9  Ankle dorsiflexion      Ankle plantarflexion      Ankle inversion      Ankle eversion       (Blank rows = not tested)                                 Tandem stance: 10s                                                                                                Treatment Date: 6/6 Manual:  Large trigger point noted in lower lumbar spine  Trigger point release to trigger points down through the IT band  Nuero-re ed  Slow march at counter with graded upper extremity support.  Therapy use tactile cueing to not let patient shift to the left with right single-leg march.  Therapy corrected lateral movement he had increased knee pain.  3 x 8 weeks tactile cueing required  Anatomy of transverse abdominal muscles and function. Cable row 3x12 10 lbs with cuing for breathing  Shoulder extension 3x12 15 lbs  Discussed RPE. Discussed how these concepts can be used for all of his gym exercises   6/3  STM  L glute and lumbar paraspinals  UPA grade III L L2-5  - Child's Pose Stretch  - 1 x daily - 7 x weekly - 1 sets - 15 reps - 2 hold - Seated Quadratus Lumborum Stretch with Arm Overhead  - 2 x daily - 7 x weekly - 1 sets - 3 reps - 30 hold - Standing Sidebends  - 1 x daily - 7 x weekly - 2 sets - 10 reps   Exam findings, HEP progression, anatomy of condition, DOMS expectations, massage therapy options  5/28  Nustep lvl 4 5 min  STM L posterior hip  Prone quad stretch 30s 3x Piriformis stretch 30s 3x L knee flexion gapping 20x 2s hold Staggered stance squat GTB at knees 3x8 SL shuttle leg press 75lbs 3x8 4" lateral step down 2x8  RPE 6-7 with exercise   5/22  Nustep lvl 5 5 min  Stretch 30s 3x: prone quad, pirformis, supine HS  4" lateral step down 3x8 SL squat to table with R heel touch 4x6 S/L shuttle leg press 50lbs 3x8  Tandem walk hallway 3x   5/20   STM L quad and L posterior hip glutes  Prone quad stretch 30s 3x STS with mid thigh height with GTB at knees 3x8 Sidestepping in hall GTB at knees 3x8 8" box step up 3x8  Gym exercise and frequency with HEP updates   5/15  STM L quad and L posterior hip glutes  Prone quad stretch 30s 3x Supine piriformis with knee bent 30s 3x Half kneeling hip flexor  stretch 30s 3x Anatomy of condition Self STM technique, gym exercise modifications    Treatment                            5/13: Blank lines following charge title = not provided on this treatment date.   Manual:  TPDN No Roller Rt quads & ITB There-ex: Mod thomas stretch with strap Bridge with ball bw knees, + chest press Marching bridge Hip hinge to sit<>stand Lateral resisted stepping at cable machine There-Act:  Self Care:  Nuro-Re-ed: Improving arm swing in gait Step with fwd arm swing- cues for glut set Gait Training:   5/8 Manual: All PROM performed with distraction to reduce pain and improve movement Trigger Point Dry Needling  Subsequent Treatment: Instructions reviewed, if requested by the patient, prior to subsequent dry needling treatment.   Patient Verbal Consent Given: Yes Education Handout Provided: Previously Provided Muscles Treated: R glute 3x using a .30x50 needle Electrical Stimulation Performed: No Treatment Response/Outcome: strong muscle twitch response   There-ex: Nustep lvl 3 Leg ext 3x10 25lbs Leg press 3x10 40lbs  Cybex row machine 3x10 50lbs  Figure 4 stretch 3x30sec      Treatment  4/30: Blank lines following charge title = not provided on this treatment date.   Manual:  TPDN YES Trigger Point Dry Needling  Subsequent Treatment: Instructions provided previously at initial dry needling treatment. - previously needled in [facility] system  Patient Verbal Consent Given: Yes Education Handout Provided: Previously Provided Muscles Treated: Lt Glut med/min, TFL Electrical Stimulation Performed: No Treatment Response/Outcome: twitch with decreased concordant pain  There-ex: Supine piriformis stretch Sidelying clam Sidelying hip circles Seated figure 4 Standing quad stretch- foot on table, hands on chair SLS with toe prop There-Act:  Self Care:  Nuro-Re-ed:  Gait  Training: Glut set at toe off to decrease trunk flexion in Lt stance phase     PATIENT EDUCATION:  Education details: HEP, symptom management Person educated: Patient Education method: Explanation, Demonstration, Tactile cues, Verbal cues, and Handouts Education comprehension: verbalized understanding and returned demonstration  HOME EXERCISE PROGRAM: Access Code:  [email or web address] URL: [email or web address] Date: 09/30/2023 Prepared by: Lonzell Robin    ASSESSMENT:  CLINICAL IMPRESSION: The patient had trigger points in his back today as well as down his IT band. We discussed self soft tissue mobilization We reviewed weight bearing exercises at the counter. He required tactile cuing. When he doesn't shift it hurts his knee. Therapy will continue to progress as tolerated.   Eval: Patient is a 78 y.o. male who was seen today for physical therapy evaluation and treatment for left hip pain. Balance and gait were assessed noting diminished Objective measurements were taken and noted with strength deficits in hip abduction and knee extension. Trigger points were spotted in lateral hip near greater trochanter, IT band, and upper glute region. Pt was educated on at home soft tissue mobilizations with theracane and tennis ball. Exercises were performed for general strengthening. Education was provided on exercises for proper muscle activation and posture. Pt can progress balance, strength, and gait endurance. Pt will continue to benefit from skilled physical therapy to improve community ambulation and functional strength.   OBJECTIVE IMPAIRMENTS: Abnormal gait, decreased activity tolerance, decreased balance, decreased coordination, decreased endurance, decreased mobility, difficulty walking, decreased ROM, decreased strength, hypomobility, and impaired flexibility.   ACTIVITY LIMITATIONS: carrying, lifting, bending, standing, squatting, sleeping, stairs, bathing, toileting, dressing, and  locomotion level  PARTICIPATION LIMITATIONS: cleaning, laundry, shopping, community activity, and yard work  PERSONAL FACTORS: 1-2 comorbidities: Lumbar surgery, balance are also affecting patient's functional outcome.   REHAB POTENTIAL: Good  CLINICAL DECISION MAKING: Evolving/moderate complexity fluctuation in pain levels with ambulation and balance  EVALUATION COMPLEXITY: Moderate   GOALS: Goals reviewed with patient? Yes  SHORT TERM GOALS: Target date: 10/28/2023   Pt will increase hip abduction strength by 5lbs for community ambulation. Baseline: Goal status: MET  2.  Pt will increase knee extension strength by 5lbs for community ambulation.  Baseline:  Goal status: MET  3.  Pt will be able to complete tandem stance with eyes closed with moderate swaying. Baseline:  Goal status: MET    LONG TERM GOALS: Target date: 11/25/2023    Pt will increase hip abduction strength by 5lbs for community ambulation. Baseline:  Goal status: MEt  2.  Pt will increase knee extension strength by 5lbs for community ambulation.  Baseline:  Goal status: MET  3.  Pt will be able to complete tandem stance with minimal swaying.  Baseline:  Goal status: ongoing  4.  Pt will be able to ambulate community distances with increased endurance more than . Baseline:  Goal status: ongoing    PLAN:  PT  FREQUENCY: 1-2x/week  PT DURATION: 8 weeks  PLANNED INTERVENTIONS: 97164- PT Re-evaluation, 97110-Therapeutic exercises, 97530- Therapeutic activity, [id number]- Neuromuscular re-education, 97535- Self Care, 10272- Manual therapy, [id number]- Gait training, [phone]- Aquatic Therapy, [phone]- Electrical stimulation (unattended), [id number]- Electrical stimulation (manual), and 97012- Traction (mechanical)  PLAN FOR NEXT SESSION: Expand HEP, work on balance and gait training.   Signa Drier PT DT 11/12/23 11:40 AM

## 2023-11-13 ENCOUNTER — Encounter (HOSPITAL_BASED_OUTPATIENT_CLINIC_OR_DEPARTMENT_OTHER): Payer: Self-pay | Admitting: Physical Therapy

## 2023-11-17 ENCOUNTER — Encounter (HOSPITAL_BASED_OUTPATIENT_CLINIC_OR_DEPARTMENT_OTHER): Payer: Self-pay | Admitting: Physical Therapy

## 2023-11-17 ENCOUNTER — Ambulatory Visit (HOSPITAL_BASED_OUTPATIENT_CLINIC_OR_DEPARTMENT_OTHER): Admitting: Physical Therapy

## 2023-11-17 DIAGNOSIS — M5459 Other low back pain: Secondary | ICD-10-CM

## 2023-11-17 DIAGNOSIS — M25652 Stiffness of left hip, not elsewhere classified: Secondary | ICD-10-CM

## 2023-11-17 DIAGNOSIS — M25552 Pain in left hip: Secondary | ICD-10-CM

## 2023-11-17 DIAGNOSIS — E871 Hypo-osmolality and hyponatremia: Secondary | ICD-10-CM | POA: Diagnosis not present

## 2023-11-17 DIAGNOSIS — M6281 Muscle weakness (generalized): Secondary | ICD-10-CM | POA: Diagnosis not present

## 2023-11-17 DIAGNOSIS — I10 Essential (primary) hypertension: Secondary | ICD-10-CM | POA: Diagnosis not present

## 2023-11-17 NOTE — Therapy (Signed)
 OUTPATIENT PHYSICAL THERAPY LOWER EXTREMITY EVALUATION  Progress Note Reporting Period 09/30/23 to 11/10/23   See note below for Objective Data and Assessment of Progress/Goals.       Patient Name: Jonathon Johnson MRN: [id number] DOB:September 30, 1945, 78 y.o., male Today's Date: 11/17/2023  END OF SESSION:  PT End of Session - 11/17/23 1358     Visit Number 12    Number of Visits 16    Date for PT Re-Evaluation 11/25/23    PT Start Time 0845    PT Stop Time 0928    PT Time Calculation (min) 43 min    Activity Tolerance Patient tolerated treatment well;No increased pain    Behavior During Therapy [facility] for tasks assessed/performed                     Past Medical History:  Diagnosis Date   Asthma    Chicken pox    GERD (gastroesophageal reflux disease)    Hyperlipidemia 11/04/2016   Hypertension 07/19/2018   Past Surgical History:  Procedure Laterality Date   CATARACT EXTRACTION Bilateral    02/2023   CHOLECYSTECTOMY     TRANSFORAMINAL LUMBAR INTERBODY FUSION (TLIF) WITH PEDICLE SCREW FIXATION 1 LEVEL Right 09/25/2021   Procedure: RIGHT-SIDED TRANSFORAMINAL LUMBAR INTERBODY FUSION AND DECOMPRESSION LUMBAR 4-  LUMBAR 5 WITH INSTRUMENTATION AND ALLOGRAFT;  Surgeon: Virl Grimes, MD;  Location: [facility] OR;  Service: Orthopedics;  Laterality: Right;   Patient Active Problem List   Diagnosis Date Noted   Arthritis of left hip 09/01/2023   Greater trochanteric bursitis of left hip 09/01/2023   Hyponatremia 08/07/2023   Radiculopathy 09/25/2021   Post herpetic neuralgia 11/08/2020   Chronic bilateral low back pain with bilateral sciatica 08/28/2020   Alcohol  use 03/16/2020   Thumb pain, right 06/28/2019   Arthralgia of right temporomandibular joint 12/20/2018   Diarrhea 12/20/2018   Screening for diabetes mellitus 07/19/2018   Insomnia 07/19/2018   Gastroesophageal reflux disease 07/19/2018   Benign prostatic hyperplasia with urinary hesitancy 07/19/2018   Essential  hypertension 07/19/2018   Elevated LDL cholesterol level 07/19/2018   Cough 07/19/2018   Mild reactive airways disease 07/19/2018    PCP: Tonna Frederic, MD  REFERRING PROVIDER: Isidro Margo, DO  REFERRING DIAG:  Diagnosis  M25.552 (ICD-10-CM) - Left hip pain    THERAPY DIAG:  Pain in left hip  Muscle weakness (generalized)  Stiffness of left hip, not elsewhere classified  Other low back pain  Rationale for Evaluation and Treatment: Rehabilitation  ONSET DATE: Before Christmas  SUBJECTIVE:   SUBJECTIVE STATEMENT: The patient reports the severity and duration of his pain is improving. He was a little sore after last time but not too bad.    Eval: Pt came in with left hip pain. Received recent injection for left trochanteric bursitis. Pt states pain and discomfort has been the same since and before the injection. Feels the pain most when walking and often has issues with endurance and balance. Is retired but wants to feel more comfortable ambulating community distances and being on his feet for longer periods of time. Pt states pain at worst is 2/10 and feels like a toothache. Pain is located deep in the hip and sometimes feels spots on the lateral hip.   PERTINENT HISTORY: (2022) LBP with neuro symptoms radiating into bilateral lower legs,  Disc space narrowing is noted throughout the lumbar spine. Mild anterolisthesis of a degenerative nature is noted at L4-5. Multilevel osteophytic changes are seen worst  on the left at L1-2. No soft tissue abnormality is noted. PAIN:  Are you having pain? No: NPRS scale: 2-3/10 when standing and walking  Pain location: deep Pain description: achy, toothache Aggravating factors: sleeping on left side Relieving factors: rest  PRECAUTIONS: None  RED FLAGS: None   WEIGHT BEARING RESTRICTIONS: No  FALLS:  Has patient fallen in last 6 months? Yes. Number of falls 2 last one was on Monday  LIVING  ENVIRONMENT:   OCCUPATION: retired  PLOF: Independent  PATIENT GOALS: Find out what can help him feel better  NEXT MD VISIT: 5/27  OBJECTIVE:  Note: Objective measures were completed at Evaluation unless otherwise noted.  DIAGNOSTIC FINDINGS:  FINDINGS: 2022 Five lumbar type vertebral bodies are well visualized. Vertebral body height is well maintained. Disc space narrowing is noted throughout the lumbar spine. Mild anterolisthesis of a degenerative nature is noted at L4-5. Multilevel osteophytic changes are seen worst on the left at L1-2. No soft tissue abnormality is noted.   IMPRESSION: Multilevel degenerative change with anterolisthesis at L4-5.  PATIENT SURVEYS:  LEFS 49/80 6/3 LEFS: Lower Extremity Functional Score: 64 / 80 = 80.0 %  COGNITION: Overall cognitive status: Within functional limits for tasks assessed     SENSATION: WFL  POSTURE: rounded shoulders, forward head, and flexed trunk   PALPATION: TTP over lateral hip  LOWER EXTREMITY ROM:  Passive ROM Right eval Left eval L 6/3d  Hip flexion  limited 120 no pain  Hip extension     Hip abduction     Hip adduction     Hip internal rotation  limited 30  Hip external rotation  [facility] 45  Knee flexion     Knee extension     Ankle dorsiflexion     Ankle plantarflexion     Ankle inversion     Ankle eversion      (Blank rows = not tested)    LOWER EXTREMITY MMT:  MMT Right eval R 6/3 Left eval L 6/3  Hip flexion 33.6 50.4 33.5 48.7  Hip extension      Hip abduction 39.2 37.7 27.3 35.2  Hip adduction      Hip internal rotation      Hip external rotation      Knee flexion      Knee extension 51.7 54.1 34.7 53.9  Ankle dorsiflexion      Ankle plantarflexion      Ankle inversion      Ankle eversion       (Blank rows = not tested)                                 Tandem stance: 10s                                                                                               Treatment  Date: 6/10 Manual:  Large trigger point noted in lower lumbar spine  Trigger point release to trigger points down through the IT band   Nuero-re ed  Bridge with band  3x10 Green   There-ex  Single leg leg press 3x10 50 lbs  Knee extension 10 lb L LE 3x10     6/6 Manual:  Large trigger point noted in lower lumbar spine  Trigger point release to trigger points down through the IT band   Nuero-re ed  Slow march at counter with graded upper extremity support.  Therapy use tactile cueing to not let patient shift to the left with right single-leg march.  Therapy corrected lateral movement he had increased knee pain.  3 x 8 weeks tactile cueing required  Anatomy of transverse abdominal muscles and function. Cable row 3x12 10 lbs with cuing for breathing  Shoulder extension 3x12 15 lbs  Discussed RPE. Discussed how these concepts can be used for all of his gym exercises   6/3  STM  L glute and lumbar paraspinals  UPA grade III L L2-5  - Child's Pose Stretch  - 1 x daily - 7 x weekly - 1 sets - 15 reps - 2 hold - Seated Quadratus Lumborum Stretch with Arm Overhead  - 2 x daily - 7 x weekly - 1 sets - 3 reps - 30 hold - Standing Sidebends  - 1 x daily - 7 x weekly - 2 sets - 10 reps   Exam findings, HEP progression, anatomy of condition, DOMS expectations, massage therapy options  5/28  Nustep lvl 4 5 min  STM L posterior hip  Prone quad stretch 30s 3x Piriformis stretch 30s 3x L knee flexion gapping 20x 2s hold Staggered stance squat GTB at knees 3x8 SL shuttle leg press 75lbs 3x8 4" lateral step down 2x8  RPE 6-7 with exercise   5/22  Nustep lvl 5 5 min  Stretch 30s 3x: prone quad, pirformis, supine HS  4" lateral step down 3x8 SL squat to table with R heel touch 4x6 S/L shuttle leg press 50lbs 3x8  Tandem walk hallway 3x   5/20   STM L quad and L posterior hip glutes  Prone quad stretch 30s 3x STS with mid thigh height with GTB at knees  3x8 Sidestepping in hall GTB at knees 3x8 8" box step up 3x8  Gym exercise and frequency with HEP updates   5/15  STM L quad and L posterior hip glutes  Prone quad stretch 30s 3x Supine piriformis with knee bent 30s 3x Half kneeling hip flexor stretch 30s 3x Anatomy of condition Self STM technique, gym exercise modifications    Treatment                            5/13: Blank lines following charge title = not provided on this treatment date.   Manual:  TPDN No Roller Rt quads & ITB There-ex: Mod thomas stretch with strap Bridge with ball bw knees, + chest press Marching bridge Hip hinge to sit<>stand Lateral resisted stepping at cable machine There-Act:  Self Care:  Nuro-Re-ed: Improving arm swing in gait Step with fwd arm swing- cues for glut set Gait Training:   5/8 Manual: All PROM performed with distraction to reduce pain and improve movement Trigger Point Dry Needling  Subsequent Treatment: Instructions reviewed, if requested by the patient, prior to subsequent dry needling treatment.   Patient Verbal Consent Given: Yes Education Handout Provided: Previously Provided Muscles Treated: R glute 3x using a .30x50 needle Electrical Stimulation Performed: No Treatment Response/Outcome: strong muscle twitch response   There-ex: Nustep lvl 3  Leg ext 3x10 25lbs Leg press 3x10 40lbs  Cybex row machine 3x10 50lbs  Figure 4 stretch 3x30sec      Treatment                            4/30: Blank lines following charge title = not provided on this treatment date.   Manual:  TPDN YES Trigger Point Dry Needling  Subsequent Treatment: Instructions provided previously at initial dry needling treatment. - previously needled in [facility] system  Patient Verbal Consent Given: Yes Education Handout Provided: Previously Provided Muscles Treated: Lt Glut med/min, TFL Electrical Stimulation Performed: No Treatment Response/Outcome: twitch with  decreased concordant pain  There-ex: Supine piriformis stretch Sidelying clam Sidelying hip circles Seated figure 4 Standing quad stretch- foot on table, hands on chair SLS with toe prop There-Act:  Self Care:  Nuro-Re-ed:  Gait Training: Glut set at toe off to decrease trunk flexion in Lt stance phase     PATIENT EDUCATION:  Education details: HEP, symptom management Person educated: Patient Education method: Explanation, Demonstration, Tactile cues, Verbal cues, and Handouts Education comprehension: verbalized understanding and returned demonstration  HOME EXERCISE PROGRAM: Access Code:  [email or web address] URL: [email or web address] Date: 09/30/2023 Prepared by: Lonzell Robin    ASSESSMENT:  CLINICAL IMPRESSION: The patient continues to have a large trigger point in his left lateral hip. It reduced with manual therapy. We reviewed LE gym exercises. He tolerated well. He had done them on his own a few days before. We reviewed how to breath with these exercises and maintaining good posture. We will continue to advance there-ex as tolerated.    Eval: Patient is a 78 y.o. male who was seen today for physical therapy evaluation and treatment for left hip pain. Balance and gait were assessed noting diminished Objective measurements were taken and noted with strength deficits in hip abduction and knee extension. Trigger points were spotted in lateral hip near greater trochanter, IT band, and upper glute region. Pt was educated on at home soft tissue mobilizations with theracane and tennis ball. Exercises were performed for general strengthening. Education was provided on exercises for proper muscle activation and posture. Pt can progress balance, strength, and gait endurance. Pt will continue to benefit from skilled physical therapy to improve community ambulation and functional strength.   OBJECTIVE IMPAIRMENTS: Abnormal gait, decreased activity tolerance, decreased balance,  decreased coordination, decreased endurance, decreased mobility, difficulty walking, decreased ROM, decreased strength, hypomobility, and impaired flexibility.   ACTIVITY LIMITATIONS: carrying, lifting, bending, standing, squatting, sleeping, stairs, bathing, toileting, dressing, and locomotion level  PARTICIPATION LIMITATIONS: cleaning, laundry, shopping, community activity, and yard work  PERSONAL FACTORS: 1-2 comorbidities: Lumbar surgery, balance are also affecting patient's functional outcome.   REHAB POTENTIAL: Good  CLINICAL DECISION MAKING: Evolving/moderate complexity fluctuation in pain levels with ambulation and balance  EVALUATION COMPLEXITY: Moderate   GOALS: Goals reviewed with patient? Yes  SHORT TERM GOALS: Target date: 10/28/2023   Pt will increase hip abduction strength by 5lbs for community ambulation. Baseline: Goal status: MET  2.  Pt will increase knee extension strength by 5lbs for community ambulation.  Baseline:  Goal status: MET  3.  Pt will be able to complete tandem stance with eyes closed with moderate swaying. Baseline:  Goal status: MET    LONG TERM GOALS: Target date: 11/25/2023    Pt will increase hip abduction strength by 5lbs for community ambulation. Baseline:  Goal status: MEt  2.  Pt will  increase knee extension strength by 5lbs for community ambulation.  Baseline:  Goal status: MET  3.  Pt will be able to complete tandem stance with minimal swaying.  Baseline:  Goal status: ongoing  4.  Pt will be able to ambulate community distances with increased endurance more than . Baseline:  Goal status: ongoing    PLAN:  PT FREQUENCY: 1-2x/week  PT DURATION: 8 weeks  PLANNED INTERVENTIONS: 97164- PT Re-evaluation, 97110-Therapeutic exercises, 97530- Therapeutic activity, [id number]- Neuromuscular re-education, 97535- Self Care, [id number]- Manual therapy, [id number]- Gait training, [phone]- Aquatic Therapy, [phone]- Electrical stimulation  (unattended), [id number]- Electrical stimulation (manual), and 97012- Traction (mechanical)  PLAN FOR NEXT SESSION: Expand HEP, work on balance and gait training.   Signa Drier PT DT 11/17/23 2:16 PM

## 2023-11-19 ENCOUNTER — Ambulatory Visit (HOSPITAL_BASED_OUTPATIENT_CLINIC_OR_DEPARTMENT_OTHER): Admitting: Physical Therapy

## 2023-11-19 ENCOUNTER — Encounter (HOSPITAL_BASED_OUTPATIENT_CLINIC_OR_DEPARTMENT_OTHER): Payer: Self-pay | Admitting: Physical Therapy

## 2023-11-19 DIAGNOSIS — M5459 Other low back pain: Secondary | ICD-10-CM

## 2023-11-19 DIAGNOSIS — M25552 Pain in left hip: Secondary | ICD-10-CM

## 2023-11-19 DIAGNOSIS — M6281 Muscle weakness (generalized): Secondary | ICD-10-CM | POA: Diagnosis not present

## 2023-11-19 DIAGNOSIS — M25652 Stiffness of left hip, not elsewhere classified: Secondary | ICD-10-CM | POA: Diagnosis not present

## 2023-11-19 NOTE — Therapy (Signed)
 OUTPATIENT PHYSICAL THERAPY LOWER EXTREMITY     Patient Name: Jonathon Johnson MRN: [id number] DOB:02/06/1946, 78 y.o., male Today's Date: 11/19/2023  END OF SESSION:  PT End of Session - 11/19/23 0839     Visit Number 13    Number of Visits 16    Date for PT Re-Evaluation 11/25/23    PT Start Time 0845    PT Stop Time 0926    PT Time Calculation (min) 41 min    Activity Tolerance Patient tolerated treatment well;No increased pain    Behavior During Therapy [facility] for tasks assessed/performed                  Past Medical History:  Diagnosis Date   Asthma    Chicken pox    GERD (gastroesophageal reflux disease)    Hyperlipidemia 11/04/2016   Hypertension 07/19/2018   Past Surgical History:  Procedure Laterality Date   CATARACT EXTRACTION Bilateral    02/2023   CHOLECYSTECTOMY     TRANSFORAMINAL LUMBAR INTERBODY FUSION (TLIF) WITH PEDICLE SCREW FIXATION 1 LEVEL Right 09/25/2021   Procedure: RIGHT-SIDED TRANSFORAMINAL LUMBAR INTERBODY FUSION AND DECOMPRESSION LUMBAR 4-  LUMBAR 5 WITH INSTRUMENTATION AND ALLOGRAFT;  Surgeon: Virl Grimes, MD;  Location: [facility] OR;  Service: Orthopedics;  Laterality: Right;   Patient Active Problem List   Diagnosis Date Noted   Arthritis of left hip 09/01/2023   Greater trochanteric bursitis of left hip 09/01/2023   Hyponatremia 08/07/2023   Radiculopathy 09/25/2021   Post herpetic neuralgia 11/08/2020   Chronic bilateral low back pain with bilateral sciatica 08/28/2020   Alcohol  use 03/16/2020   Thumb pain, right 06/28/2019   Arthralgia of right temporomandibular joint 12/20/2018   Diarrhea 12/20/2018   Screening for diabetes mellitus 07/19/2018   Insomnia 07/19/2018   Gastroesophageal reflux disease 07/19/2018   Benign prostatic hyperplasia with urinary hesitancy 07/19/2018   Essential hypertension 07/19/2018   Elevated LDL cholesterol level 07/19/2018   Cough 07/19/2018   Mild reactive airways disease 07/19/2018    PCP:  Tonna Frederic, MD  REFERRING PROVIDER: Isidro Margo, DO  REFERRING DIAG:  Diagnosis  M25.552 (ICD-10-CM) - Left hip pain    THERAPY DIAG:  Pain in left hip  Muscle weakness (generalized)  Stiffness of left hip, not elsewhere classified  Other low back pain  Rationale for Evaluation and Treatment: Rehabilitation  ONSET DATE: Before Christmas  SUBJECTIVE:   SUBJECTIVE STATEMENT: The patient reports the severity and duration of his pain is improving. He was a little sore after last time but not too bad.    Eval: Pt came in with left hip pain. Received recent injection for left trochanteric bursitis. Pt states pain and discomfort has been the same since and before the injection. Feels the pain most when walking and often has issues with endurance and balance. Is retired but wants to feel more comfortable ambulating community distances and being on his feet for longer periods of time. Pt states pain at worst is 2/10 and feels like a toothache. Pain is located deep in the hip and sometimes feels spots on the lateral hip.   PERTINENT HISTORY: (2022) LBP with neuro symptoms radiating into bilateral lower legs,  Disc space narrowing is noted throughout the lumbar spine. Mild anterolisthesis of a degenerative nature is noted at L4-5. Multilevel osteophytic changes are seen worst on the left at L1-2. No soft tissue abnormality is noted. PAIN:  Are you having pain? No: NPRS scale: 2-3/10 when standing and walking  Pain location: deep Pain description: achy, toothache Aggravating factors: sleeping on left side Relieving factors: rest  PRECAUTIONS: None  RED FLAGS: None   WEIGHT BEARING RESTRICTIONS: No  FALLS:  Has patient fallen in last 6 months? Yes. Number of falls 2 last one was on Monday  LIVING ENVIRONMENT:   OCCUPATION: retired  PLOF: Independent  PATIENT GOALS: Find out what can help him feel better  NEXT MD VISIT: 5/27  OBJECTIVE:  Note:  Objective measures were completed at Evaluation unless otherwise noted.  DIAGNOSTIC FINDINGS:  FINDINGS: 2022 Five lumbar type vertebral bodies are well visualized. Vertebral body height is well maintained. Disc space narrowing is noted throughout the lumbar spine. Mild anterolisthesis of a degenerative nature is noted at L4-5. Multilevel osteophytic changes are seen worst on the left at L1-2. No soft tissue abnormality is noted.   IMPRESSION: Multilevel degenerative change with anterolisthesis at L4-5.  PATIENT SURVEYS:  LEFS 49/80 6/3 LEFS: Lower Extremity Functional Score: 64 / 80 = 80.0 %  COGNITION: Overall cognitive status: Within functional limits for tasks assessed     SENSATION: WFL  POSTURE: rounded shoulders, forward head, and flexed trunk   PALPATION: TTP over lateral hip  LOWER EXTREMITY ROM:  Passive ROM Right eval Left eval L 6/3d  Hip flexion  limited 120 no pain  Hip extension     Hip abduction     Hip adduction     Hip internal rotation  limited 30  Hip external rotation  [facility] 45  Knee flexion     Knee extension     Ankle dorsiflexion     Ankle plantarflexion     Ankle inversion     Ankle eversion      (Blank rows = not tested)    LOWER EXTREMITY MMT:  MMT Right eval R 6/3 Left eval L 6/3  Hip flexion 33.6 50.4 33.5 48.7  Hip extension      Hip abduction 39.2 37.7 27.3 35.2  Hip adduction      Hip internal rotation      Hip external rotation      Knee flexion      Knee extension 51.7 54.1 34.7 53.9  Ankle dorsiflexion      Ankle plantarflexion      Ankle inversion      Ankle eversion       (Blank rows = not tested)                                 Tandem stance: 10s                                                                                               Treatment Date: 6/12   STM  L glute and lumbar paraspinals, QL  UPA grade III L L2-5  Child's pose with R SB 10s 10x Weighted SB 5lbs 2x10 each Jefferson curl 5lbs  5x 8 lateral step up 2x8   6/10 Manual:  Large trigger point noted in lower lumbar spine  Trigger point release to  trigger points down through the IT band   Nuero-re ed  Bridge with band 3x10 Green   There-ex  Single leg leg press 3x10 50 lbs  Knee extension 10 lb L LE 3x10     6/6 Manual:  Large trigger point noted in lower lumbar spine  Trigger point release to trigger points down through the IT band   Nuero-re ed  Slow march at counter with graded upper extremity support.  Therapy use tactile cueing to not let patient shift to the left with right single-leg march.  Therapy corrected lateral movement he had increased knee pain.  3 x 8 weeks tactile cueing required  Anatomy of transverse abdominal muscles and function. Cable row 3x12 10 lbs with cuing for breathing  Shoulder extension 3x12 15 lbs  Discussed RPE. Discussed how these concepts can be used for all of his gym exercises   6/3  STM  L glute and lumbar paraspinals  UPA grade III L L2-5  - Child's Pose Stretch  - 1 x daily - 7 x weekly - 1 sets - 15 reps - 2 hold - Seated Quadratus Lumborum Stretch with Arm Overhead  - 2 x daily - 7 x weekly - 1 sets - 3 reps - 30 hold - Standing Sidebends  - 1 x daily - 7 x weekly - 2 sets - 10 reps   Exam findings, HEP progression, anatomy of condition, DOMS expectations, massage therapy options  5/28  Nustep lvl 4 5 min  STM L posterior hip  Prone quad stretch 30s 3x Piriformis stretch 30s 3x L knee flexion gapping 20x 2s hold Staggered stance squat GTB at knees 3x8 SL shuttle leg press 75lbs 3x8 4 lateral step down 2x8  RPE 6-7 with exercise   5/22  Nustep lvl 5 5 min  Stretch 30s 3x: prone quad, pirformis, supine HS  4 lateral step down 3x8 SL squat to table with R heel touch 4x6 S/L shuttle leg press 50lbs 3x8  Tandem walk hallway 3x   5/20   STM L quad and L posterior hip glutes  Prone quad stretch 30s 3x STS with mid thigh height  with GTB at knees 3x8 Sidestepping in hall GTB at knees 3x8 8 box step up 3x8  Gym exercise and frequency with HEP updates   5/15  STM L quad and L posterior hip glutes  Prone quad stretch 30s 3x Supine piriformis with knee bent 30s 3x Half kneeling hip flexor stretch 30s 3x Anatomy of condition Self STM technique, gym exercise modifications    Treatment                            5/13: Blank lines following charge title = not provided on this treatment date.   Manual:  TPDN No Roller Rt quads & ITB There-ex: Mod thomas stretch with strap Bridge with ball bw knees, + chest press Marching bridge Hip hinge to sit<>stand Lateral resisted stepping at cable machine There-Act:  Self Care:  Nuro-Re-ed: Improving arm swing in gait Step with fwd arm swing- cues for glut set Gait Training:   5/8 Manual: All PROM performed with distraction to reduce pain and improve movement Trigger Point Dry Needling  Subsequent Treatment: Instructions reviewed, if requested by the patient, prior to subsequent dry needling treatment.   Patient Verbal Consent Given: Yes Education Handout Provided: Previously Provided Muscles Treated: R glute 3x using a .30x50 needle Electrical Stimulation  Performed: No Treatment Response/Outcome: strong muscle twitch response   There-ex: Nustep lvl 3 Leg ext 3x10 25lbs Leg press 3x10 40lbs  Cybex row machine 3x10 50lbs  Figure 4 stretch 3x30sec      Treatment                            4/30: Blank lines following charge title = not provided on this treatment date.   Manual:  TPDN YES Trigger Point Dry Needling  Subsequent Treatment: Instructions provided previously at initial dry needling treatment. - previously needled in [facility] system  Patient Verbal Consent Given: Yes Education Handout Provided: Previously Provided Muscles Treated: Lt Glut med/min, TFL Electrical Stimulation Performed: No Treatment  Response/Outcome: twitch with decreased concordant pain  There-ex: Supine piriformis stretch Sidelying clam Sidelying hip circles Seated figure 4 Standing quad stretch- foot on table, hands on chair SLS with toe prop There-Act:  Self Care:  Nuro-Re-ed:  Gait Training: Glut set at toe off to decrease trunk flexion in Lt stance phase     PATIENT EDUCATION:  Education details: HEP, symptom management Person educated: Patient Education method: Explanation, Demonstration, Tactile cues, Verbal cues, and Handouts Education comprehension: verbalized understanding and returned demonstration  HOME EXERCISE PROGRAM: Access Code:  [email or web address] URL: [email or web address] Date: 09/30/2023 Prepared by: Lonzell Robin    ASSESSMENT:  CLINICAL IMPRESSION: Pt L lumbar and hip stiffness still persists but is much less sensitive as compared to previous sessions. Pt able to continue with progression of L LE and lumbar mobility exercise. Lateral step up especially difficult and requires VC and UE support to perform correctly. SB exercise progressed to weighted version and jefferson curl was stiff as expected. HEP updated accordingly. Plan to continue with exercise progression as tolerated and consider D/C in 3-4 sessions if pt continues on current trajectory.     Eval: Patient is a 78 y.o. male who was seen today for physical therapy evaluation and treatment for left hip pain. Balance and gait were assessed noting diminished Objective measurements were taken and noted with strength deficits in hip abduction and knee extension. Trigger points were spotted in lateral hip near greater trochanter, IT band, and upper glute region. Pt was educated on at home soft tissue mobilizations with theracane and tennis ball. Exercises were performed for general strengthening. Education was provided on exercises for proper muscle activation and posture. Pt can progress balance, strength, and gait endurance.  Pt will continue to benefit from skilled physical therapy to improve community ambulation and functional strength.   OBJECTIVE IMPAIRMENTS: Abnormal gait, decreased activity tolerance, decreased balance, decreased coordination, decreased endurance, decreased mobility, difficulty walking, decreased ROM, decreased strength, hypomobility, and impaired flexibility.   ACTIVITY LIMITATIONS: carrying, lifting, bending, standing, squatting, sleeping, stairs, bathing, toileting, dressing, and locomotion level  PARTICIPATION LIMITATIONS: cleaning, laundry, shopping, community activity, and yard work  PERSONAL FACTORS: 1-2 comorbidities: Lumbar surgery, balance are also affecting patient's functional outcome.   REHAB POTENTIAL: Good  CLINICAL DECISION MAKING: Evolving/moderate complexity fluctuation in pain levels with ambulation and balance  EVALUATION COMPLEXITY: Moderate   GOALS: Goals reviewed with patient? Yes  SHORT TERM GOALS: Target date: 10/28/2023   Pt will increase hip abduction strength by 5lbs for community ambulation. Baseline: Goal status: MET  2.  Pt will increase knee extension strength by 5lbs for community ambulation.  Baseline:  Goal status: MET  3.  Pt will be able to complete tandem stance with eyes closed with moderate  swaying. Baseline:  Goal status: MET    LONG TERM GOALS: Target date: 11/25/2023    Pt will increase hip abduction strength by 5lbs for community ambulation. Baseline:  Goal status: MEt  2.  Pt will increase knee extension strength by 5lbs for community ambulation.  Baseline:  Goal status: MET  3.  Pt will be able to complete tandem stance with minimal swaying.  Baseline:  Goal status: ongoing  4.  Pt will be able to ambulate community distances with increased endurance more than . Baseline:  Goal status: ongoing    PLAN:  PT FREQUENCY: 1-2x/week  PT DURATION: 8 weeks  PLANNED INTERVENTIONS: 97164- PT Re-evaluation,  97110-Therapeutic exercises, 97530- Therapeutic activity, [id number]- Neuromuscular re-education, 97535- Self Care, [id number]- Manual therapy, [id number]- Gait training, [phone]- Aquatic Therapy, [phone]- Electrical stimulation (unattended), [id number]- Electrical stimulation (manual), and 97012- Traction (mechanical)  PLAN FOR NEXT SESSION: Expand HEP, work on balance and gait training.   Signa Drier PT DT 11/19/23 9:44 AM

## 2023-11-24 ENCOUNTER — Ambulatory Visit (HOSPITAL_BASED_OUTPATIENT_CLINIC_OR_DEPARTMENT_OTHER): Admitting: Physical Therapy

## 2023-11-24 ENCOUNTER — Encounter (HOSPITAL_BASED_OUTPATIENT_CLINIC_OR_DEPARTMENT_OTHER): Payer: Self-pay | Admitting: Physical Therapy

## 2023-11-24 DIAGNOSIS — M6281 Muscle weakness (generalized): Secondary | ICD-10-CM | POA: Diagnosis not present

## 2023-11-24 DIAGNOSIS — M5459 Other low back pain: Secondary | ICD-10-CM

## 2023-11-24 DIAGNOSIS — M25652 Stiffness of left hip, not elsewhere classified: Secondary | ICD-10-CM | POA: Diagnosis not present

## 2023-11-24 DIAGNOSIS — M25552 Pain in left hip: Secondary | ICD-10-CM

## 2023-11-24 NOTE — Therapy (Signed)
 OUTPATIENT PHYSICAL THERAPY LOWER EXTREMITY     Patient Name: Jonathon Johnson MRN: [id number] DOB:Jul 08, 1945, 78 y.o., male Today's Date: 11/24/2023  END OF SESSION:  PT End of Session - 11/24/23 0856     Visit Number 14    Number of Visits 16    Date for PT Re-Evaluation 11/25/23    PT Start Time 0846    PT Stop Time 0925    PT Time Calculation (min) 39 min    Activity Tolerance Patient tolerated treatment well;No increased pain    Behavior During Therapy [facility] for tasks assessed/performed                   Past Medical History:  Diagnosis Date   Asthma    Chicken pox    GERD (gastroesophageal reflux disease)    Hyperlipidemia 11/04/2016   Hypertension 07/19/2018   Past Surgical History:  Procedure Laterality Date   CATARACT EXTRACTION Bilateral    02/2023   CHOLECYSTECTOMY     TRANSFORAMINAL LUMBAR INTERBODY FUSION (TLIF) WITH PEDICLE SCREW FIXATION 1 LEVEL Right 09/25/2021   Procedure: RIGHT-SIDED TRANSFORAMINAL LUMBAR INTERBODY FUSION AND DECOMPRESSION LUMBAR 4-  LUMBAR 5 WITH INSTRUMENTATION AND ALLOGRAFT;  Surgeon: Virl Grimes, MD;  Location: [facility] OR;  Service: Orthopedics;  Laterality: Right;   Patient Active Problem List   Diagnosis Date Noted   Arthritis of left hip 09/01/2023   Greater trochanteric bursitis of left hip 09/01/2023   Hyponatremia 08/07/2023   Radiculopathy 09/25/2021   Post herpetic neuralgia 11/08/2020   Chronic bilateral low back pain with bilateral sciatica 08/28/2020   Alcohol  use 03/16/2020   Thumb pain, right 06/28/2019   Arthralgia of right temporomandibular joint 12/20/2018   Diarrhea 12/20/2018   Screening for diabetes mellitus 07/19/2018   Insomnia 07/19/2018   Gastroesophageal reflux disease 07/19/2018   Benign prostatic hyperplasia with urinary hesitancy 07/19/2018   Essential hypertension 07/19/2018   Elevated LDL cholesterol level 07/19/2018   Cough 07/19/2018   Mild reactive airways disease 07/19/2018    PCP:  Tonna Frederic, MD  REFERRING PROVIDER: Isidro Margo, DO  REFERRING DIAG:  Diagnosis  M25.552 (ICD-10-CM) - Left hip pain    THERAPY DIAG:  Pain in left hip  Muscle weakness (generalized)  Stiffness of left hip, not elsewhere classified  Other low back pain  Rationale for Evaluation and Treatment: Rehabilitation  ONSET DATE: Before Christmas  SUBJECTIVE:   SUBJECTIVE STATEMENT: Pt states his glute and calf were very sore. Pt does note aching of the L knee. He is walking more normally now.    Eval: Pt came in with left hip pain. Received recent injection for left trochanteric bursitis. Pt states pain and discomfort has been the same since and before the injection. Feels the pain most when walking and often has issues with endurance and balance. Is retired but wants to feel more comfortable ambulating community distances and being on his feet for longer periods of time. Pt states pain at worst is 2/10 and feels like a toothache. Pain is located deep in the hip and sometimes feels spots on the lateral hip.   PERTINENT HISTORY: (2022) LBP with neuro symptoms radiating into bilateral lower legs,  Disc space narrowing is noted throughout the lumbar spine. Mild anterolisthesis of a degenerative nature is noted at L4-5. Multilevel osteophytic changes are seen worst on the left at L1-2. No soft tissue abnormality is noted. PAIN:  Are you having pain? yes: NPRS scale: 1/10 when standing and walking  Pain location: deep Pain description: achy, toothache Aggravating factors: sleeping on left side Relieving factors: rest  PRECAUTIONS: None  RED FLAGS: None   WEIGHT BEARING RESTRICTIONS: No  FALLS:  Has patient fallen in last 6 months? Yes. Number of falls 2 last one was on Monday  LIVING ENVIRONMENT:   OCCUPATION: retired  PLOF: Independent  PATIENT GOALS: Find out what can help him feel better  NEXT MD VISIT: 5/27  OBJECTIVE:  Note: Objective measures  were completed at Evaluation unless otherwise noted.  DIAGNOSTIC FINDINGS:  FINDINGS: 2022 Five lumbar type vertebral bodies are well visualized. Vertebral body height is well maintained. Disc space narrowing is noted throughout the lumbar spine. Mild anterolisthesis of a degenerative nature is noted at L4-5. Multilevel osteophytic changes are seen worst on the left at L1-2. No soft tissue abnormality is noted.   IMPRESSION: Multilevel degenerative change with anterolisthesis at L4-5.  PATIENT SURVEYS:  LEFS 49/80 6/3 LEFS: Lower Extremity Functional Score: 64 / 80 = 80.0 %  COGNITION: Overall cognitive status: Within functional limits for tasks assessed     SENSATION: WFL  POSTURE: rounded shoulders, forward head, and flexed trunk   PALPATION: TTP over lateral hip  LOWER EXTREMITY ROM:  Passive ROM Right eval Left eval L 6/3d  Hip flexion  limited 120 no pain  Hip extension     Hip abduction     Hip adduction     Hip internal rotation  limited 30  Hip external rotation  [facility] 45  Knee flexion     Knee extension     Ankle dorsiflexion     Ankle plantarflexion     Ankle inversion     Ankle eversion      (Blank rows = not tested)    LOWER EXTREMITY MMT:  MMT Right eval R 6/3 Left eval L 6/3  Hip flexion 33.6 50.4 33.5 48.7  Hip extension      Hip abduction 39.2 37.7 27.3 35.2  Hip adduction      Hip internal rotation      Hip external rotation      Knee flexion      Knee extension 51.7 54.1 34.7 53.9  Ankle dorsiflexion      Ankle plantarflexion      Ankle inversion      Ankle eversion       (Blank rows = not tested)                                 Tandem stance: 10s                                                                                               Treatment Date:  6/17   STM  L quad; rec fem and VL  Kneeling hip hinge 3x8 5s holds Prone knee ext GTB 3x8 Fwd step up 8 15lbs 3x8  6/12   STM  L glute and lumbar paraspinals,  QL  UPA grade III L L2-5  Child's pose with R SB 10s 10x Weighted  SB 5lbs 2x10 each Jefferson curl 5lbs 5x 8 lateral step up 2x8   6/10 Manual:  Large trigger point noted in lower lumbar spine  Trigger point release to trigger points down through the IT band   Nuero-re ed  Bridge with band 3x10 Green   There-ex  Single leg leg press 3x10 50 lbs  Knee extension 10 lb L LE 3x10     6/6 Manual:  Large trigger point noted in lower lumbar spine  Trigger point release to trigger points down through the IT band   Nuero-re ed  Slow march at counter with graded upper extremity support.  Therapy use tactile cueing to not let patient shift to the left with right single-leg march.  Therapy corrected lateral movement he had increased knee pain.  3 x 8 weeks tactile cueing required  Anatomy of transverse abdominal muscles and function. Cable row 3x12 10 lbs with cuing for breathing  Shoulder extension 3x12 15 lbs  Discussed RPE. Discussed how these concepts can be used for all of his gym exercises   6/3  STM  L glute and lumbar paraspinals  UPA grade III L L2-5  - Child's Pose Stretch  - 1 x daily - 7 x weekly - 1 sets - 15 reps - 2 hold - Seated Quadratus Lumborum Stretch with Arm Overhead  - 2 x daily - 7 x weekly - 1 sets - 3 reps - 30 hold - Standing Sidebends  - 1 x daily - 7 x weekly - 2 sets - 10 reps   Exam findings, HEP progression, anatomy of condition, DOMS expectations, massage therapy options  5/28  Nustep lvl 4 5 min  STM L posterior hip  Prone quad stretch 30s 3x Piriformis stretch 30s 3x L knee flexion gapping 20x 2s hold Staggered stance squat GTB at knees 3x8 SL shuttle leg press 75lbs 3x8 4 lateral step down 2x8  RPE 6-7 with exercise   5/22  Nustep lvl 5 5 min  Stretch 30s 3x: prone quad, pirformis, supine HS  4 lateral step down 3x8 SL squat to table with R heel touch 4x6 S/L shuttle leg press 50lbs 3x8  Tandem walk hallway  3x   5/20   STM L quad and L posterior hip glutes  Prone quad stretch 30s 3x STS with mid thigh height with GTB at knees 3x8 Sidestepping in hall GTB at knees 3x8 8 box step up 3x8  Gym exercise and frequency with HEP updates   5/15  STM L quad and L posterior hip glutes  Prone quad stretch 30s 3x Supine piriformis with knee bent 30s 3x Half kneeling hip flexor stretch 30s 3x Anatomy of condition Self STM technique, gym exercise modifications    Treatment                            5/13: Blank lines following charge title = not provided on this treatment date.   Manual:  TPDN No Roller Rt quads & ITB There-ex: Mod thomas stretch with strap Bridge with ball bw knees, + chest press Marching bridge Hip hinge to sit<>stand Lateral resisted stepping at cable machine There-Act:  Self Care:  Nuro-Re-ed: Improving arm swing in gait Step with fwd arm swing- cues for glut set Gait Training:   5/8 Manual: All PROM performed with distraction to reduce pain and improve movement Trigger Point Dry Needling  Subsequent Treatment: Instructions reviewed, if requested by  the patient, prior to subsequent dry needling treatment.   Patient Verbal Consent Given: Yes Education Handout Provided: Previously Provided Muscles Treated: R glute 3x using a .30x50 needle Electrical Stimulation Performed: No Treatment Response/Outcome: strong muscle twitch response   There-ex: Nustep lvl 3 Leg ext 3x10 25lbs Leg press 3x10 40lbs  Cybex row machine 3x10 50lbs  Figure 4 stretch 3x30sec      Treatment                            4/30: Blank lines following charge title = not provided on this treatment date.   Manual:  TPDN YES Trigger Point Dry Needling  Subsequent Treatment: Instructions provided previously at initial dry needling treatment. - previously needled in [facility] system  Patient Verbal Consent Given: Yes Education Handout Provided:  Previously Provided Muscles Treated: Lt Glut med/min, TFL Electrical Stimulation Performed: No Treatment Response/Outcome: twitch with decreased concordant pain  There-ex: Supine piriformis stretch Sidelying clam Sidelying hip circles Seated figure 4 Standing quad stretch- foot on table, hands on chair SLS with toe prop There-Act:  Self Care:  Nuro-Re-ed:  Gait Training: Glut set at toe off to decrease trunk flexion in Lt stance phase     PATIENT EDUCATION:  Education details: HEP, symptom management Person educated: Patient Education method: Explanation, Demonstration, Tactile cues, Verbal cues, and Handouts Education comprehension: verbalized understanding and returned demonstration  HOME EXERCISE PROGRAM: Access Code:  [email or web address] URL: [email or web address] Date: 09/30/2023 Prepared by: Lonzell Robin    ASSESSMENT:  CLINICAL IMPRESSION: Pt continues to improve with strength. Hep progressed for intensity and edu provided on frequency modifications in the gym in order to reduce anterior knee pain. Pain appears to be patellar tendon related. Plan to D/C at next session with final HEP as pt continues to make large improvements. Knee flexion at full 140 without pain today.    Eval: Patient is a 78 y.o. male who was seen today for physical therapy evaluation and treatment for left hip pain. Balance and gait were assessed noting diminished Objective measurements were taken and noted with strength deficits in hip abduction and knee extension. Trigger points were spotted in lateral hip near greater trochanter, IT band, and upper glute region. Pt was educated on at home soft tissue mobilizations with theracane and tennis ball. Exercises were performed for general strengthening. Education was provided on exercises for proper muscle activation and posture. Pt can progress balance, strength, and gait endurance. Pt will continue to benefit from skilled physical therapy to  improve community ambulation and functional strength.   OBJECTIVE IMPAIRMENTS: Abnormal gait, decreased activity tolerance, decreased balance, decreased coordination, decreased endurance, decreased mobility, difficulty walking, decreased ROM, decreased strength, hypomobility, and impaired flexibility.   ACTIVITY LIMITATIONS: carrying, lifting, bending, standing, squatting, sleeping, stairs, bathing, toileting, dressing, and locomotion level  PARTICIPATION LIMITATIONS: cleaning, laundry, shopping, community activity, and yard work  PERSONAL FACTORS: 1-2 comorbidities: Lumbar surgery, balance are also affecting patient's functional outcome.   REHAB POTENTIAL: Good  CLINICAL DECISION MAKING: Evolving/moderate complexity fluctuation in pain levels with ambulation and balance  EVALUATION COMPLEXITY: Moderate   GOALS: Goals reviewed with patient? Yes  SHORT TERM GOALS: Target date: 10/28/2023   Pt will increase hip abduction strength by 5lbs for community ambulation. Baseline: Goal status: MET  2.  Pt will increase knee extension strength by 5lbs for community ambulation.  Baseline:  Goal status: MET  3.  Pt will be able to complete tandem  stance with eyes closed with moderate swaying. Baseline:  Goal status: MET    LONG TERM GOALS: Target date: 11/25/2023    Pt will increase hip abduction strength by 5lbs for community ambulation. Baseline:  Goal status: MEt  2.  Pt will increase knee extension strength by 5lbs for community ambulation.  Baseline:  Goal status: MET  3.  Pt will be able to complete tandem stance with minimal swaying.  Baseline:  Goal status: ongoing  4.  Pt will be able to ambulate community distances with increased endurance more than . Baseline:  Goal status: ongoing    PLAN:  PT FREQUENCY: 1-2x/week  PT DURATION: 8 weeks  PLANNED INTERVENTIONS: 97164- PT Re-evaluation, 97110-Therapeutic exercises, 97530- Therapeutic activity, [id number]-  Neuromuscular re-education, 97535- Self Care, 16109- Manual therapy, [id number]- Gait training, [phone]- Aquatic Therapy, [id number]- Electrical stimulation (unattended), [id number]- Electrical stimulation (manual), and 97012- Traction (mechanical)  PLAN FOR NEXT SESSION: Expand HEP, work on balance and gait training.   Silver Dross PT, DPT 11/24/23 9:31 AM

## 2023-11-26 ENCOUNTER — Ambulatory Visit (HOSPITAL_BASED_OUTPATIENT_CLINIC_OR_DEPARTMENT_OTHER): Admitting: Physical Therapy

## 2023-11-26 ENCOUNTER — Encounter (HOSPITAL_BASED_OUTPATIENT_CLINIC_OR_DEPARTMENT_OTHER): Payer: Self-pay | Admitting: Physical Therapy

## 2023-11-26 DIAGNOSIS — M25652 Stiffness of left hip, not elsewhere classified: Secondary | ICD-10-CM

## 2023-11-26 DIAGNOSIS — M25552 Pain in left hip: Secondary | ICD-10-CM | POA: Diagnosis not present

## 2023-11-26 DIAGNOSIS — M6281 Muscle weakness (generalized): Secondary | ICD-10-CM | POA: Diagnosis not present

## 2023-11-26 DIAGNOSIS — M5459 Other low back pain: Secondary | ICD-10-CM

## 2023-11-26 NOTE — Therapy (Signed)
 OUTPATIENT PHYSICAL THERAPY LOWER EXTREMITY    PHYSICAL THERAPY DISCHARGE SUMMARY  Visits from Start of Care: 15  Plan: Patient agrees to discharge.  Patient goals were met. Patient is being discharged due to meeting the stated rehab goals.         Patient Name: Jonathon Johnson MRN: [id number] DOB:01-04-1946, 78 y.o., male Today's Date: 11/26/2023  END OF SESSION:  PT End of Session - 11/26/23 0845     Visit Number 15    Number of Visits 16    Date for PT Re-Evaluation 11/25/23    Authorization Type MCR    PT Start Time 0845    PT Stop Time 0915    PT Time Calculation (min) 30 min    Activity Tolerance Patient tolerated treatment well;No increased pain    Behavior During Therapy [facility] for tasks assessed/performed                   Past Medical History:  Diagnosis Date   Asthma    Chicken pox    GERD (gastroesophageal reflux disease)    Hyperlipidemia 11/04/2016   Hypertension 07/19/2018   Past Surgical History:  Procedure Laterality Date   CATARACT EXTRACTION Bilateral    02/2023   CHOLECYSTECTOMY     TRANSFORAMINAL LUMBAR INTERBODY FUSION (TLIF) WITH PEDICLE SCREW FIXATION 1 LEVEL Right 09/25/2021   Procedure: RIGHT-SIDED TRANSFORAMINAL LUMBAR INTERBODY FUSION AND DECOMPRESSION LUMBAR 4-  LUMBAR 5 WITH INSTRUMENTATION AND ALLOGRAFT;  Surgeon: Virl Grimes, MD;  Location: [facility] OR;  Service: Orthopedics;  Laterality: Right;   Patient Active Problem List   Diagnosis Date Noted   Arthritis of left hip 09/01/2023   Greater trochanteric bursitis of left hip 09/01/2023   Hyponatremia 08/07/2023   Radiculopathy 09/25/2021   Post herpetic neuralgia 11/08/2020   Chronic bilateral low back pain with bilateral sciatica 08/28/2020   Alcohol  use 03/16/2020   Thumb pain, right 06/28/2019   Arthralgia of right temporomandibular joint 12/20/2018   Diarrhea 12/20/2018   Screening for diabetes mellitus 07/19/2018   Insomnia 07/19/2018   Gastroesophageal reflux  disease 07/19/2018   Benign prostatic hyperplasia with urinary hesitancy 07/19/2018   Essential hypertension 07/19/2018   Elevated LDL cholesterol level 07/19/2018   Cough 07/19/2018   Mild reactive airways disease 07/19/2018    PCP: Tonna Frederic, MD  REFERRING PROVIDER: Isidro Margo, DO  REFERRING DIAG:  Diagnosis  M25.552 (ICD-10-CM) - Left hip pain    THERAPY DIAG:  Pain in left hip  Muscle weakness (generalized)  Stiffness of left hip, not elsewhere classified  Other low back pain  Rationale for Evaluation and Treatment: Rehabilitation  ONSET DATE: Before Christmas  SUBJECTIVE:   SUBJECTIVE STATEMENT: Pt states he walked a lot yesterday so he was achy and fatigued. No other hip issues. Pain was 2-3/10 and is much better. Pt feels about 75% or better. He is still hesitant about walking outside.    Eval: Pt came in with left hip pain. Received recent injection for left trochanteric bursitis. Pt states pain and discomfort has been the same since and before the injection. Feels the pain most when walking and often has issues with endurance and balance. Is retired but wants to feel more comfortable ambulating community distances and being on his feet for longer periods of time. Pt states pain at worst is 2/10 and feels like a toothache. Pain is located deep in the hip and sometimes feels spots on the lateral hip.   PERTINENT HISTORY: (2022) LBP  with neuro symptoms radiating into bilateral lower legs,  Disc space narrowing is noted throughout the lumbar spine. Mild anterolisthesis of a degenerative nature is noted at L4-5. Multilevel osteophytic changes are seen worst on the left at L1-2. No soft tissue abnormality is noted. PAIN:  Are you having pain? yes: NPRS scale: 1/10 when standing and walking  Pain location: deep Pain description: achy, toothache Aggravating factors: sleeping on left side Relieving factors: rest  PRECAUTIONS: None  RED  FLAGS: None   WEIGHT BEARING RESTRICTIONS: No  FALLS:  Has patient fallen in last 6 months? Yes. Number of falls 2 last one was on Monday  LIVING ENVIRONMENT:   OCCUPATION: retired  PLOF: Independent  PATIENT GOALS: Find out what can help him feel better  NEXT MD VISIT: 5/27  OBJECTIVE:  Note: Objective measures were completed at Evaluation unless otherwise noted.  DIAGNOSTIC FINDINGS:  FINDINGS: 2022 Five lumbar type vertebral bodies are well visualized. Vertebral body height is well maintained. Disc space narrowing is noted throughout the lumbar spine. Mild anterolisthesis of a degenerative nature is noted at L4-5. Multilevel osteophytic changes are seen worst on the left at L1-2. No soft tissue abnormality is noted.   IMPRESSION: Multilevel degenerative change with anterolisthesis at L4-5.  PATIENT SURVEYS:  LEFS 49/80 6/3 LEFS: Lower Extremity Functional Score: 64 / 80 = 80.0 %  LEFS: 6/19 68/80= 85%   COGNITION: Overall cognitive status: Within functional limits for tasks assessed     SENSATION: WFL  POSTURE: rounded shoulders, forward head, and flexed trunk   PALPATION: TTP over lateral hip  LOWER EXTREMITY ROM:  Passive ROM Right eval Left eval L 6/3  Hip flexion  limited 120 no pain  Hip extension     Hip abduction     Hip adduction     Hip internal rotation  limited 30  Hip external rotation  [facility] 45  Knee flexion     Knee extension     Ankle dorsiflexion     Ankle plantarflexion     Ankle inversion     Ankle eversion      (Blank rows = not tested)    LOWER EXTREMITY MMT:  MMT Right eval R 6/3 R 6/19 Left eval L 6/3 L 6/19  Hip flexion 33.6 50.4 52.4 33.5 48.7 49.7  Hip extension        Hip abduction 39.2 37.7 41.8 27.3 35.2 43.8  Hip adduction        Hip internal rotation        Hip external rotation        Knee flexion        Knee extension 51.7 54.1 55.1 34.7 53.9 55.0  Ankle dorsiflexion        Ankle plantarflexion         Ankle inversion        Ankle eversion         (Blank rows = not tested)                                 Tandem stance: 10s  Treatment Date:  6/19  D/C instructions, exercise progressions, exercise programming, personal training options, walking program progression  6/17   STM  L quad; rec fem and VL  Kneeling hip hinge 3x8 5s holds Prone knee ext GTB 3x8 Fwd step up 8 15lbs 3x8  6/12   STM  L glute and lumbar paraspinals, QL  UPA grade III L L2-5  Child's pose with R SB 10s 10x Weighted SB 5lbs 2x10 each Jefferson curl 5lbs 5x 8 lateral step up 2x8   6/10 Manual:  Large trigger point noted in lower lumbar spine  Trigger point release to trigger points down through the IT band   Nuero-re ed  Bridge with band 3x10 Green   There-ex  Single leg leg press 3x10 50 lbs  Knee extension 10 lb L LE 3x10     6/6 Manual:  Large trigger point noted in lower lumbar spine  Trigger point release to trigger points down through the IT band   Nuero-re ed  Slow march at counter with graded upper extremity support.  Therapy use tactile cueing to not let patient shift to the left with right single-leg march.  Therapy corrected lateral movement he had increased knee pain.  3 x 8 weeks tactile cueing required  Anatomy of transverse abdominal muscles and function. Cable row 3x12 10 lbs with cuing for breathing  Shoulder extension 3x12 15 lbs  Discussed RPE. Discussed how these concepts can be used for all of his gym exercises   6/3  STM  L glute and lumbar paraspinals  UPA grade III L L2-5  - Child's Pose Stretch  - 1 x daily - 7 x weekly - 1 sets - 15 reps - 2 hold - Seated Quadratus Lumborum Stretch with Arm Overhead  - 2 x daily - 7 x weekly - 1 sets - 3 reps - 30 hold - Standing Sidebends  - 1 x daily - 7 x weekly - 2 sets - 10 reps   Exam findings, HEP  progression, anatomy of condition, DOMS expectations, massage therapy options  5/28  Nustep lvl 4 5 min  STM L posterior hip  Prone quad stretch 30s 3x Piriformis stretch 30s 3x L knee flexion gapping 20x 2s hold Staggered stance squat GTB at knees 3x8 SL shuttle leg press 75lbs 3x8 4 lateral step down 2x8  RPE 6-7 with exercise   5/22  Nustep lvl 5 5 min  Stretch 30s 3x: prone quad, pirformis, supine HS  4 lateral step down 3x8 SL squat to table with R heel touch 4x6 S/L shuttle leg press 50lbs 3x8  Tandem walk hallway 3x   5/20   STM L quad and L posterior hip glutes  Prone quad stretch 30s 3x STS with mid thigh height with GTB at knees 3x8 Sidestepping in hall GTB at knees 3x8 8 box step up 3x8  Gym exercise and frequency with HEP updates   5/15  STM L quad and L posterior hip glutes  Prone quad stretch 30s 3x Supine piriformis with knee bent 30s 3x Half kneeling hip flexor stretch 30s 3x Anatomy of condition Self STM technique, gym exercise modifications    Treatment                            5/13: Blank lines following charge title = not provided on this treatment date.   Manual:  TPDN No Roller Rt quads & ITB There-ex: Mod thomas  stretch with strap Bridge with ball bw knees, + chest press Marching bridge Hip hinge to sit<>stand Lateral resisted stepping at cable machine There-Act:  Self Care:  Nuro-Re-ed: Improving arm swing in gait Step with fwd arm swing- cues for glut set Gait Training:   5/8 Manual: All PROM performed with distraction to reduce pain and improve movement Trigger Point Dry Needling  Subsequent Treatment: Instructions reviewed, if requested by the patient, prior to subsequent dry needling treatment.   Patient Verbal Consent Given: Yes Education Handout Provided: Previously Provided Muscles Treated: R glute 3x using a .30x50 needle Electrical Stimulation Performed: No Treatment  Response/Outcome: strong muscle twitch response   There-ex: Nustep lvl 3 Leg ext 3x10 25lbs Leg press 3x10 40lbs  Cybex row machine 3x10 50lbs  Figure 4 stretch 3x30sec      Treatment                            4/30: Blank lines following charge title = not provided on this treatment date.   Manual:  TPDN YES Trigger Point Dry Needling  Subsequent Treatment: Instructions provided previously at initial dry needling treatment. - previously needled in [facility] system  Patient Verbal Consent Given: Yes Education Handout Provided: Previously Provided Muscles Treated: Lt Glut med/min, TFL Electrical Stimulation Performed: No Treatment Response/Outcome: twitch with decreased concordant pain  There-ex: Supine piriformis stretch Sidelying clam Sidelying hip circles Seated figure 4 Standing quad stretch- foot on table, hands on chair SLS with toe prop There-Act:  Self Care:  Nuro-Re-ed:  Gait Training: Glut set at toe off to decrease trunk flexion in Lt stance phase     PATIENT EDUCATION:  Education details: HEP, symptom management Person educated: Patient Education method: Explanation, Demonstration, Tactile cues, Verbal cues, and Handouts Education comprehension: verbalized understanding and returned demonstration  HOME EXERCISE PROGRAM: Access Code:  [email or web address] URL: [email or web address] Date: 09/30/2023 Prepared by: Lonzell Robin    ASSESSMENT:  CLINICAL IMPRESSION: Pt has met all PT goals at this time and feels he is independent with exercise management of his LBP and L hip pian. Pt is at 75-85% function as demo'd by subjective report and LEFS measure. D/C this episode of care at this time. Pt gave verbal understanding to all instruction regarding self management and exercise progressions.    Eval: Patient is a 78 y.o. male who was seen today for physical therapy evaluation and treatment for left hip pain. Balance and gait were assessed  noting diminished Objective measurements were taken and noted with strength deficits in hip abduction and knee extension. Trigger points were spotted in lateral hip near greater trochanter, IT band, and upper glute region. Pt was educated on at home soft tissue mobilizations with theracane and tennis ball. Exercises were performed for general strengthening. Education was provided on exercises for proper muscle activation and posture. Pt can progress balance, strength, and gait endurance. Pt will continue to benefit from skilled physical therapy to improve community ambulation and functional strength.   OBJECTIVE IMPAIRMENTS: Abnormal gait, decreased activity tolerance, decreased balance, decreased coordination, decreased endurance, decreased mobility, difficulty walking, decreased ROM, decreased strength, hypomobility, and impaired flexibility.   ACTIVITY LIMITATIONS: carrying, lifting, bending, standing, squatting, sleeping, stairs, bathing, toileting, dressing, and locomotion level  PARTICIPATION LIMITATIONS: cleaning, laundry, shopping, community activity, and yard work  PERSONAL FACTORS: 1-2 comorbidities: Lumbar surgery, balance are also affecting patient's functional outcome.   REHAB POTENTIAL: Good  CLINICAL DECISION MAKING: Evolving/moderate  complexity fluctuation in pain levels with ambulation and balance  EVALUATION COMPLEXITY: Moderate   GOALS: Goals reviewed with patient? Yes  SHORT TERM GOALS: Target date: 10/28/2023   Pt will increase hip abduction strength by 5lbs for community ambulation. Baseline: Goal status: MET  2.  Pt will increase knee extension strength by 5lbs for community ambulation.  Baseline:  Goal status: MET  3.  Pt will be able to complete tandem stance with eyes closed with moderate swaying. Baseline:  Goal status: MET    LONG TERM GOALS: Target date: 11/25/2023    Pt will increase hip abduction strength by 5lbs for community  ambulation. Baseline:  Goal status: MEt  2.  Pt will increase knee extension strength by 5lbs for community ambulation.  Baseline:  Goal status: MET  3.  Pt will be able to complete tandem stance with minimal swaying.  Baseline:  Goal status: MET  4.  Pt will be able to ambulate community distances with increased endurance more than . Baseline:  Goal status: MET    PLAN:  PT FREQUENCY: 1-2x/week  PT DURATION: 8 weeks  PLANNED INTERVENTIONS: 97164- PT Re-evaluation, 97110-Therapeutic exercises, 97530- Therapeutic activity, [id number]- Neuromuscular re-education, 97535- Self Care, [id number]- Manual therapy, [id number]- Gait training, [phone]- Aquatic Therapy, [id number]- Electrical stimulation (unattended), [id number]- Electrical stimulation (manual), and [id number]- Traction (mechanical) it training.   Silver Dross PT, DPT 11/26/23 9:27 AM

## 2023-12-15 DIAGNOSIS — I10 Essential (primary) hypertension: Secondary | ICD-10-CM | POA: Diagnosis not present

## 2024-01-07 NOTE — Progress Notes (Unsigned)
 [name] [address] [place] [address] Phone: [phone] Subjective:   Jonathon Johnson, am serving as a scribe for Dr. Arthea Claudene.  I'm seeing this patient by the request  of:  Berneta Elsie Sayre, MD  CC: Left hip pain  YEP:Dlagzrupcz  11/03/2023 Known arthritis of the hip noted but patient's pain still seems to be more on the lateral aspect of the hip.  We discussed with patient about icing regimen and home exercises still.  Patient is making progress overall.  Discussed which activities to do and which ones to avoid.  Increase activity slowly.  Follow-up with me again in 6 to 8 weeks otherwise.      Update 01/12/2024 Jonathon Johnson is a 78 y.o. male coming in with complaint of L hip pain.  Known arthritis in the hip previously.  Still pain was more over the lateral aspect of the hip.  Been going to formal physical therapy on a very regular basis.  Discharge in June of this year patient states went to PT and is better. Can still have some discomfort with walking and laying on that side.  X-rays from March of this year show mild to very possibly more moderate arthritic changes noted.  Vascular calcifications noted    Past Medical History:  Diagnosis Date   Asthma    Chicken pox    GERD (gastroesophageal reflux disease)    Hyperlipidemia 11/04/2016   Hypertension 07/19/2018   Past Surgical History:  Procedure Laterality Date   CATARACT EXTRACTION Bilateral    02/2023   CHOLECYSTECTOMY     TRANSFORAMINAL LUMBAR INTERBODY FUSION (TLIF) WITH PEDICLE SCREW FIXATION 1 LEVEL Right 09/25/2021   Procedure: RIGHT-SIDED TRANSFORAMINAL LUMBAR INTERBODY FUSION AND DECOMPRESSION LUMBAR 4-  LUMBAR 5 WITH INSTRUMENTATION AND ALLOGRAFT;  Surgeon: Beuford Anes, MD;  Location: [facility] OR;  Service: Orthopedics;  Laterality: Right;   Social History   Socioeconomic History   Marital status: Married    Spouse name: Not on file   Number of children: Not  on file   Years of education: Not on file   Highest education level: Some college, no degree  Occupational History   Not on file  Tobacco Use   Smoking status: Former    Current packs/day: 0.00    Types: Cigarettes    Quit date: 07/19/2010    Years since quitting: 13.4   Smokeless tobacco: Never  Vaping Use   Vaping status: Never Used  Substance and Sexual Activity   Alcohol  use: Yes    Alcohol /week: 14.0 standard drinks of alcohol     Types: 14 Glasses of wine per week    Comment: 3-4 glasses of wine daily   Drug use: Never   Sexual activity: Yes    Partners: Female  Other Topics Concern   Not on file  Social History Narrative   Right handed   Social Drivers of Health   Financial Resource Strain: Low Risk  (08/07/2023)   Overall Financial Resource Strain (CARDIA)    Difficulty of Paying Living Expenses: Not hard at all  Food Insecurity: No Food Insecurity (08/07/2023)   Hunger Vital Sign    Worried About Running Out of Food in the Last Year: Never true    Ran Out of Food in the Last Year: Never true  Transportation Needs: No Transportation Needs (08/07/2023)   PRAPARE - Transportation    Lack of Transportation (Medical): No    Lack of Transportation (Non-Medical): No  Physical  Activity: Sufficiently Active (08/07/2023)   Exercise Vital Sign    Days of Exercise per Week: 3 days    Minutes of Exercise per Session: 50 min  Recent Concern: Physical Activity - Insufficiently Active (08/02/2023)   Exercise Vital Sign    Days of Exercise per Week: 3 days    Minutes of Exercise per Session: 40 min  Stress: No Stress Concern Present (08/07/2023)   [name] Stress Questionnaire    Feeling of Stress : Not at all  Social Connections: Moderately Integrated (08/07/2023)   Social Connection and Isolation Panel    Frequency of Communication with Friends and Family: Three times a week    Frequency of Social Gatherings with Friends and  Family: Once a week    Attends Religious Services: More than 4 times per year    Active Member of [employer] or Organizations: No    Attends Engineer, structural: Never    Marital Status: Married  Recent Concern: Social Connections - Moderately Isolated (08/02/2023)   Social Connection and Isolation Panel    Frequency of Communication with Friends and Family: Once a week    Frequency of Social Gatherings with Friends and Family: Once a week    Attends Religious Services: More than 4 times per year    Active Member of [employer] or Organizations: No    Attends Engineer, structural: Not on file    Marital Status: Married   Allergies  Allergen Reactions   Amoxicillin Diarrhea   Morphine And Codeine Nausea And Vomiting   Family History  Problem Relation Age of Onset   Alcohol  abuse Mother    Depression Mother    Diabetes Mother    Alcohol  abuse Father    COPD Father    Cancer Sister    Hypertension Brother    Migraines Daughter    Multiple sclerosis Daughter      Current Outpatient Medications (Cardiovascular):    carvedilol  (COREG ) 25 MG tablet, Take 1 tablet (25 mg total) by mouth 2 (two) times daily with a meal.   rosuvastatin  (CRESTOR ) 20 MG tablet, Take 1 tablet (20 mg total) by mouth daily.   Study - VICTORION-1 PREVENT - inclisiran 300 mg/1.23mL or placebo SQ injection (PI-Stuckey), Inject 300 mg into the skin every 6 (six) months. For Investigational Use Only. Inject 1.5 mL subcutaneously into the abdomen, upper arm or thigh. Do not inject into areas of active skin disease, such as sunburns, skin rashes, inflammation, or skin infections. Administered on day 1, day 90, and every 6 months thereafter. Please contact [facility] for any questions or concerns regarding this medication.   valsartan  (DIOVAN ) 160 MG tablet, TAKE 1 TABLET DAILY  Current Outpatient Medications (Respiratory):    albuterol  (VENTOLIN  HFA) 108 (90 Base) MCG/ACT  inhaler, Inhale 2 puffs into the lungs every 6 (six) hours as needed for wheezing or shortness of breath.   fluticasone  (FLONASE ) 50 MCG/ACT nasal spray, Place 2 sprays into both nostrils daily.   WIXELA INHUB 250-50 MCG/ACT AEPB, USE 1 INHALATION TWICE A DAY  Current Outpatient Medications (Analgesics):    aspirin EC 81 MG tablet, Take 81 mg by mouth daily. Swallow whole.   ibuprofen  (ADVIL ) 600 MG tablet, Take 600 mg by mouth every 6 (six) hours as needed.   Current Outpatient Medications (Other):    amitriptyline  (ELAVIL ) 25 MG tablet, TAKE 1 TABLET AT BEDTIME   atropine 1 % ophthalmic solution, Place 1 drop  into the right eye daily.   finasteride  (PROSCAR ) 5 MG tablet, TAKE 1 TABLET DAILY   methocarbamol  (ROBAXIN ) 500 MG tablet, Take by mouth every 6 (six) hours as needed.   Multiple Vitamin (MULTIVITAMIN) tablet, Take 1 tablet by mouth daily.   omeprazole  (PRILOSEC) 40 MG capsule, TAKE 1 CAPSULE DAILY   tamsulosin  (FLOMAX ) 0.4 MG CAPS capsule, TAKE 2 CAPSULES DAILY AFTER BREAKFAST   Reviewed prior external information including notes and imaging from  primary care provider As well as notes that were available from care everywhere and other healthcare systems.  Past medical history, social, surgical and family history all reviewed in electronic medical record.  No pertanent information unless stated regarding to the chief complaint.   Review of Systems:  No headache, visual changes, nausea, vomiting, diarrhea, constipation, dizziness, abdominal pain, skin rash, fevers, chills, night sweats, weight loss, swollen lymph nodes, body aches, joint swelling, chest pain, shortness of breath, mood changes. POSITIVE muscle aches  Objective  Blood pressure 122/72, pulse 84, height 5' 6 (1.676 m), weight 182 lb (82.6 kg), SpO2 98%.   General: No apparent distress alert and oriented x3 mood and affect normal, dressed appropriately.  HEENT: Pupils equal, extraocular movements intact   Respiratory: Patient's speak in full sentences and does not appear short of breath  Cardiovascular: No lower extremity edema, non tender, no erythema  Hip exam shows improvement in some of the range of motion noted today.  The patient still is tender to palpation along the lateral aspect. Negative straight leg test but does have some loss of lordosis of the lumbar spine.     Impression and Recommendations:     The above documentation has been reviewed and is accurate and complete Jonathon Johnson M Brailee Riede, DO

## 2024-01-12 ENCOUNTER — Ambulatory Visit (INDEPENDENT_AMBULATORY_CARE_PROVIDER_SITE_OTHER): Admitting: Family Medicine

## 2024-01-12 VITALS — BP 122/72 | HR 84 | Ht 66.0 in | Wt 182.0 lb

## 2024-01-12 DIAGNOSIS — M1612 Unilateral primary osteoarthritis, left hip: Secondary | ICD-10-CM

## 2024-01-12 NOTE — Assessment & Plan Note (Addendum)
 Patient does have the arthritis of the hip but continues to have the pain more on the lateral aspect.  We discussed again about what patient can do and which ones to avoid.  Patient is still to increase activity slowly.  Continue to work on core strengthening.  Discussed icing regimen.  Follow-up with me again 12weeks

## 2024-01-12 NOTE — Patient Instructions (Addendum)
 Good to see you! Overall doing exceptional We have tricks if we need them CONTINUE TO SLOWLY INCREASE ACTIVITY See you again in 2-3 months

## 2024-01-18 ENCOUNTER — Other Ambulatory Visit: Payer: Self-pay | Admitting: Family Medicine

## 2024-01-18 DIAGNOSIS — N401 Enlarged prostate with lower urinary tract symptoms: Secondary | ICD-10-CM

## 2024-02-04 ENCOUNTER — Ambulatory Visit: Payer: Medicare Other | Admitting: Family Medicine

## 2024-02-09 ENCOUNTER — Ambulatory Visit (INDEPENDENT_AMBULATORY_CARE_PROVIDER_SITE_OTHER): Admitting: Family Medicine

## 2024-02-09 ENCOUNTER — Encounter: Payer: Self-pay | Admitting: Family Medicine

## 2024-02-09 VITALS — BP 132/86 | HR 79 | Temp 97.0°F | Ht 66.0 in | Wt 182.8 lb

## 2024-02-09 DIAGNOSIS — F1090 Alcohol use, unspecified, uncomplicated: Secondary | ICD-10-CM

## 2024-02-09 DIAGNOSIS — M1612 Unilateral primary osteoarthritis, left hip: Secondary | ICD-10-CM

## 2024-02-09 DIAGNOSIS — E871 Hypo-osmolality and hyponatremia: Secondary | ICD-10-CM

## 2024-02-09 DIAGNOSIS — R7303 Prediabetes: Secondary | ICD-10-CM | POA: Diagnosis not present

## 2024-02-09 DIAGNOSIS — R3911 Hesitancy of micturition: Secondary | ICD-10-CM

## 2024-02-09 DIAGNOSIS — E7800 Pure hypercholesterolemia, unspecified: Secondary | ICD-10-CM | POA: Diagnosis not present

## 2024-02-09 DIAGNOSIS — I10 Essential (primary) hypertension: Secondary | ICD-10-CM | POA: Diagnosis not present

## 2024-02-09 DIAGNOSIS — N401 Enlarged prostate with lower urinary tract symptoms: Secondary | ICD-10-CM | POA: Diagnosis not present

## 2024-02-09 LAB — COMPREHENSIVE METABOLIC PANEL WITH GFR
ALT: 25 U/L (ref 0–53)
AST: 30 U/L (ref 0–37)
Albumin: 4.2 g/dL (ref 3.5–5.2)
Alkaline Phosphatase: 52 U/L (ref 39–117)
BUN: 12 mg/dL (ref 6–23)
CO2: 28 meq/L (ref 19–32)
Calcium: 8.9 mg/dL (ref 8.4–10.5)
Chloride: 93 meq/L — ABNORMAL LOW (ref 96–112)
Creatinine, Ser: 0.8 mg/dL (ref 0.40–1.50)
GFR: 85.17 mL/min (ref >60.00)
Glucose, Bld: 92 mg/dL (ref 70–99)
Potassium: 4.7 meq/L (ref 3.5–5.1)
Sodium: 131 meq/L — ABNORMAL LOW (ref 135–145)
Total Bilirubin: 0.5 mg/dL (ref 0.2–1.2)
Total Protein: 6.9 g/dL (ref 6.0–8.3)

## 2024-02-09 LAB — PSA: PSA: 0.78 ng/mL (ref 0.10–4.00)

## 2024-02-09 LAB — URINALYSIS, ROUTINE W REFLEX MICROSCOPIC
Bilirubin Urine: NEGATIVE
Hgb urine dipstick: NEGATIVE
Leukocytes,Ua: NEGATIVE
Nitrite: NEGATIVE
Specific Gravity, Urine: 1.01 (ref 1.000–1.030)
Total Protein, Urine: NEGATIVE
Urine Glucose: NEGATIVE
Urobilinogen, UA: 0.2 (ref 0.0–1.0)
pH: 7 (ref 5.0–8.0)

## 2024-02-09 LAB — LIPID PANEL
Cholesterol: 177 mg/dL (ref 0–200)
HDL: 88.9 mg/dL (ref >39.00)
LDL Cholesterol: 78 mg/dL (ref 0–99)
NonHDL: 88.47
Total CHOL/HDL Ratio: 2
Triglycerides: 53 mg/dL (ref 0.0–149.0)
VLDL: 10.6 mg/dL (ref 0.0–40.0)

## 2024-02-09 LAB — CBC
HCT: 42.1 % (ref 39.0–52.0)
Hemoglobin: 14.3 g/dL (ref 13.0–17.0)
MCHC: 34 g/dL (ref 30.0–36.0)
MCV: 94.9 fl (ref 78.0–100.0)
Platelets: 258 K/uL (ref 150.0–400.0)
RBC: 4.44 Mil/uL (ref 4.22–5.81)
RDW: 12.5 % (ref 11.5–15.5)
WBC: 6 K/uL (ref 4.0–10.5)

## 2024-02-09 LAB — HEMOGLOBIN A1C: Hgb A1c MFr Bld: 5.8 % (ref 4.6–6.5)

## 2024-02-09 NOTE — Progress Notes (Signed)
 Established Patient Office Visit   Subjective:  Patient ID: Jonathon Johnson, male    DOB: 04/02/1946  Age: 78 y.o. MRN: [id number]  Chief Complaint  Patient presents with   Medical Management of Chronic Issues    6 month follow up. Pt is fasting.     HPI Encounter Diagnoses  Name Primary?   Elevated LDL cholesterol level Yes   White coat syndrome with diagnosis of hypertension    Benign prostatic hyperplasia with urinary hesitancy    Alcohol  use    Prediabetes    Arthritis of left hip    Hyponatremia    Follow-up of above.  Continues rosuvastatin  for elevated cholesterol.  Blood pressure at home has been running in the 130s over 70s with carvedilol  and Diovan .  Did see nephrology about hyponatremia.  End-stage osteoarthritis of left hip discovered by sports medicine.  Has responded to physical therapy.  He is exercising 3 times a week in the gym.  He does go for regular dental care.  BPH symptoms are well-controlled with Proscar  and tamsulosin .  Continues to enjoy 2 to 3 glasses of wine nightly.   Review of Systems  Constitutional: Negative.   HENT: Negative.    Eyes:  Negative for blurred vision, discharge and redness.  Respiratory: Negative.    Cardiovascular: Negative.   Gastrointestinal:  Negative for abdominal pain.  Genitourinary: Negative.   Musculoskeletal:  Positive for joint pain. Negative for myalgias.  Skin:  Negative for rash.  Neurological:  Negative for tingling, loss of consciousness and weakness.  Endo/Heme/Allergies:  Negative for polydipsia.     Current Outpatient Medications:    albuterol  (VENTOLIN  HFA) 108 (90 Base) MCG/ACT inhaler, Inhale 2 puffs into the lungs every 6 (six) hours as needed for wheezing or shortness of breath., Disp: 8 g, Rfl: 1   amitriptyline  (ELAVIL ) 25 MG tablet, TAKE 1 TABLET AT BEDTIME, Disp: 90 tablet, Rfl: 3   aspirin EC 81 MG tablet, Take 81 mg by mouth daily. Swallow whole., Disp: , Rfl:    atropine 1 % ophthalmic solution,  Place 1 drop into the right eye daily., Disp: , Rfl:    carvedilol  (COREG ) 25 MG tablet, Take 1 tablet (25 mg total) by mouth 2 (two) times daily with a meal., Disp: 180 tablet, Rfl: 3   cholecalciferol (VITAMIN D3) 25 MCG (1000 UNIT) tablet, Take 1,000 Units by mouth daily., Disp: , Rfl:    finasteride  (PROSCAR ) 5 MG tablet, TAKE 1 TABLET DAILY, Disp: 90 tablet, Rfl: 3   fluticasone  (FLONASE ) 50 MCG/ACT nasal spray, Place 2 sprays into both nostrils daily., Disp: 48 g, Rfl: 3   ibuprofen  (ADVIL ) 600 MG tablet, Take 600 mg by mouth every 6 (six) hours as needed., Disp: , Rfl:    methocarbamol  (ROBAXIN ) 500 MG tablet, Take by mouth every 6 (six) hours as needed., Disp: , Rfl:    Multiple Vitamin (MULTIVITAMIN) tablet, Take 1 tablet by mouth daily., Disp: , Rfl:    omeprazole  (PRILOSEC) 40 MG capsule, TAKE 1 CAPSULE DAILY, Disp: 90 capsule, Rfl: 3   rosuvastatin  (CRESTOR ) 20 MG tablet, Take 1 tablet (20 mg total) by mouth daily., Disp: 90 tablet, Rfl: 3   Study - VICTORION-1 PREVENT - inclisiran 300 mg/1.5mL or placebo SQ injection (PI-Stuckey), Inject 300 mg into the skin every 6 (six) months. For Investigational Use Only. Inject 1.5 mL subcutaneously into the abdomen, upper arm or thigh. Do not inject into areas of active skin disease, such as sunburns, skin rashes,  inflammation, or skin infections. Administered on day 1, day 90, and every 6 months thereafter. Please contact [facility] for any questions or concerns regarding this medication., Disp: , Rfl:    tamsulosin  (FLOMAX ) 0.4 MG CAPS capsule, TAKE 2 CAPSULES DAILY AFTER BREAKFAST, Disp: 180 capsule, Rfl: 3   valsartan  (DIOVAN ) 160 MG tablet, TAKE 1 TABLET DAILY, Disp: 90 tablet, Rfl: 2   WIXELA INHUB 250-50 MCG/ACT AEPB, USE 1 INHALATION TWICE A DAY, Disp: 180 each, Rfl: 3   Objective:     BP 132/86 (BP Location: Left Arm, Patient Position: Sitting, Cuff Size: Normal)   Pulse 79   Temp (!) 97 F (36.1 C)  (Temporal)   Ht 5' 6 (1.676 m)   Wt 182 lb 12.8 oz (82.9 kg)   SpO2 98%   BMI 29.50 kg/m  BP Readings from Last 3 Encounters:  02/09/24 132/86  01/12/24 122/72  11/03/23 116/70   Wt Readings from Last 3 Encounters:  02/09/24 182 lb 12.8 oz (82.9 kg)  01/12/24 182 lb (82.6 kg)  11/03/23 178 lb (80.7 kg)      Physical Exam Constitutional:      General: He is not in acute distress.    Appearance: Normal appearance. He is not ill-appearing, toxic-appearing or diaphoretic.  HENT:     Head: Normocephalic and atraumatic.     Right Ear: External ear normal.     Left Ear: External ear normal.     Mouth/Throat:     Mouth: Mucous membranes are moist.     Pharynx: Oropharynx is clear. No oropharyngeal exudate or posterior oropharyngeal erythema.  Eyes:     General: No scleral icterus.       Right eye: No discharge.        Left eye: No discharge.     Extraocular Movements: Extraocular movements intact.     Conjunctiva/sclera: Conjunctivae normal.     Pupils: Pupils are equal, round, and reactive to light.  Cardiovascular:     Rate and Rhythm: Normal rate and regular rhythm.  Pulmonary:     Effort: Pulmonary effort is normal. No respiratory distress.     Breath sounds: Normal breath sounds.  Abdominal:     General: Bowel sounds are normal.  Musculoskeletal:     Cervical back: No rigidity or tenderness.     Left hip: Decreased range of motion.  Skin:    General: Skin is warm and dry.  Neurological:     Mental Status: He is alert and oriented to person, place, and time.  Psychiatric:        Mood and Affect: Mood normal.        Behavior: Behavior normal.      No results found for any visits on 02/09/24.    The 10-year ASCVD risk score (Arnett DK, et al., 2019) is: 28.6%    Assessment & Plan:   Elevated LDL cholesterol level -     Comprehensive metabolic panel with GFR -     Lipid panel  White coat syndrome with diagnosis of hypertension -     CBC -      Comprehensive metabolic panel with GFR -     Urinalysis, Routine w reflex microscopic  Benign prostatic hyperplasia with urinary hesitancy -     PSA  Alcohol  use -     Comprehensive metabolic panel with GFR  Prediabetes -     Comprehensive metabolic panel with GFR -     Hemoglobin A1c  Arthritis  of left hip  Hyponatremia -     Comprehensive metabolic panel with GFR    Return in about 6 months (around 08/08/2024).  Discussed alcohol  as a potential for negative impact on the heart.  Continue exercising.  Will continue follow-up with sports medicine for left hip osteoarthritis.  Advised to continue weight loss efforts.  Elsie Sim Lent, MD

## 2024-02-11 ENCOUNTER — Ambulatory Visit: Payer: Self-pay | Admitting: Family Medicine

## 2024-02-29 ENCOUNTER — Encounter: Admitting: *Deleted

## 2024-02-29 VITALS — BP 145/73 | HR 68 | Temp 97.9°F | Resp 18 | Wt 177.8 lb

## 2024-02-29 DIAGNOSIS — Z006 Encounter for examination for normal comparison and control in clinical research program: Secondary | ICD-10-CM

## 2024-02-29 MED ORDER — STUDY - VICTORION-1 PREVENT - INCLISIRAN 300 MG/1.5 ML OR PLACEBO SQ INJECTION (PI-STUCKEY)
300.0000 mg | INJECTION | SUBCUTANEOUS | Status: AC
  Administered 2024-02-29: 300 mg via SUBCUTANEOUS
  Filled 2024-02-29: qty 1.5

## 2024-02-29 NOTE — Research (Signed)
 DATE: 22-Sept-2025  SUBJECT ID: [id number]  Visit 6  Prior or concomitant medications reviewed [x]   Lipid lowering medications reviewed [x]   Surgical history reviewed [x]   TIME: BP: PULSE:  0829 166/79 73  0833 137/77 71  0836 145/73 68  Mean BP: 149/76   Mean Pulse:70  Labs collected at: 0843 Fasting Lipid Profile [x]  Fasting Lp(a) []  Liver function tests []  Exploratory biomarkers []   AE/SAE assessment completed [x]   Study drug administered [x]   Endpoint assessment completed [x]   Mr. Stainback is here for V6 of V1P. He reports no abd pain, added Vit D to his meds, and reports going to urgent care for nasal congestion on 06-30-2023. Scheduled next visit for 08-29-2024 at 0830. Injection given in right lower abd at 0855. Tol well. Kit number [id number]. Discussed lifestyle instructions.    Current Outpatient Medications:    albuterol  (VENTOLIN  HFA) 108 (90 Base) MCG/ACT inhaler, Inhale 2 puffs into the lungs every 6 (six) hours as needed for wheezing or shortness of breath., Disp: 8 g, Rfl: 1   amitriptyline  (ELAVIL ) 25 MG tablet, TAKE 1 TABLET AT BEDTIME, Disp: 90 tablet, Rfl: 3   aspirin EC 81 MG tablet, Take 81 mg by mouth daily. Swallow whole., Disp: , Rfl:    atropine 1 % ophthalmic solution, Place 1 drop into the right eye daily., Disp: , Rfl:    carvedilol  (COREG ) 25 MG tablet, Take 1 tablet (25 mg total) by mouth 2 (two) times daily with a meal., Disp: 180 tablet, Rfl: 3   cholecalciferol (VITAMIN D3) 25 MCG (1000 UNIT) tablet, Take 1,000 Units by mouth daily., Disp: , Rfl:    finasteride  (PROSCAR ) 5 MG tablet, TAKE 1 TABLET DAILY, Disp: 90 tablet, Rfl: 3   fluticasone  (FLONASE ) 50 MCG/ACT nasal spray, Place 2 sprays into both nostrils daily., Disp: 48 g, Rfl: 3   ibuprofen  (ADVIL ) 600 MG tablet, Take 600 mg by mouth every 6 (six) hours as needed., Disp: , Rfl:    Multiple Vitamin (MULTIVITAMIN) tablet, Take 1 tablet by mouth daily., Disp: , Rfl:    omeprazole  (PRILOSEC) 40 MG  capsule, TAKE 1 CAPSULE DAILY, Disp: 90 capsule, Rfl: 3   rosuvastatin  (CRESTOR ) 20 MG tablet, Take 1 tablet (20 mg total) by mouth daily., Disp: 90 tablet, Rfl: 3   Study - VICTORION-1 PREVENT - inclisiran 300 mg/1.5mL or placebo SQ injection (PI-Stuckey), Inject 300 mg into the skin every 6 (six) months. For Investigational Use Only. Inject 1.5 mL subcutaneously into the abdomen, upper arm or thigh. Do not inject into areas of active skin disease, such as sunburns, skin rashes, inflammation, or skin infections. Administered on day 1, day 90, and every 6 months thereafter. Please contact [facility] for any questions or concerns regarding this medication., Disp: , Rfl:    tamsulosin  (FLOMAX ) 0.4 MG CAPS capsule, TAKE 2 CAPSULES DAILY AFTER BREAKFAST, Disp: 180 capsule, Rfl: 3   valsartan  (DIOVAN ) 160 MG tablet, TAKE 1 TABLET DAILY, Disp: 90 tablet, Rfl: 2   WIXELA INHUB 250-50 MCG/ACT AEPB, USE 1 INHALATION TWICE A DAY, Disp: 180 each, Rfl: 3   methocarbamol  (ROBAXIN ) 500 MG tablet, Take by mouth every 6 (six) hours as needed. (Patient not taking: Reported on 02/29/2024), Disp: , Rfl:   Current Facility-Administered Medications:    Study - VICTORION-1 PREVENT - inclisiran 300 mg/1.5mL or placebo SQ injection (PI-Stuckey), 300 mg, Subcutaneous, Q6 months, , 300 mg at 02/29/24 [id number]

## 2024-03-11 NOTE — Progress Notes (Signed)
 [name] [address] [place] [address] Phone: [phone] Subjective:   Jonathon Johnson, am serving as a scribe for Dr. Arthea Claudene.  I'm seeing this patient by the request  of:  Berneta Elsie Sayre, MD  CC: Left knee pain  YEP:Dlagzrupcz  01/12/2024 Patient does have the arthritis of the hip but continues to have the pain more on the lateral aspect.  We discussed again about what patient can do and which ones to avoid.  Patient is still to increase activity slowly.  Continue to work on core strengthening.  Discussed icing regimen.  Follow-up with me again 12weeks      Update 03/14/2024 Jonathon Johnson is a 78 y.o. male coming in with complaint of L hip pain. Patient states that he had massage in L quad last week and this helped his hip.   Wearing knee brace over L knee today. Some pain over lateral aspect.       Past Medical History:  Diagnosis Date   Asthma    Chicken pox    GERD (gastroesophageal reflux disease)    Hyperlipidemia 11/04/2016   Hypertension 07/19/2018   Past Surgical History:  Procedure Laterality Date   CATARACT EXTRACTION Bilateral    02/2023   CHOLECYSTECTOMY     TRANSFORAMINAL LUMBAR INTERBODY FUSION (TLIF) WITH PEDICLE SCREW FIXATION 1 LEVEL Right 09/25/2021   Procedure: RIGHT-SIDED TRANSFORAMINAL LUMBAR INTERBODY FUSION AND DECOMPRESSION LUMBAR 4-  LUMBAR 5 WITH INSTRUMENTATION AND ALLOGRAFT;  Surgeon: Beuford Anes, MD;  Location: [facility] OR;  Service: Orthopedics;  Laterality: Right;   Social History   Socioeconomic History   Marital status: Married    Spouse name: Not on file   Number of children: Not on file   Years of education: Not on file   Highest education level: Associate degree: academic program  Occupational History   Not on file  Tobacco Use   Smoking status: Former    Current packs/day: 0.00    Types: Cigarettes    Quit date: 07/19/2010    Years since quitting: 13.6   Smokeless tobacco:  Never  Vaping Use   Vaping status: Never Used  Substance and Sexual Activity   Alcohol  use: Yes    Alcohol /week: 14.0 standard drinks of alcohol     Types: 14 Glasses of wine per week    Comment: 3-4 glasses of wine daily   Drug use: Never   Sexual activity: Yes    Partners: Female  Other Topics Concern   Not on file  Social History Narrative   Right handed   Social Drivers of Health   Financial Resource Strain: Low Risk  (02/06/2024)   Overall Financial Resource Strain (CARDIA)    Difficulty of Paying Living Expenses: Not hard at all  Food Insecurity: No Food Insecurity (02/06/2024)   Hunger Vital Sign    Worried About Running Out of Food in the Last Year: Never true    Ran Out of Food in the Last Year: Never true  Transportation Needs: No Transportation Needs (02/06/2024)   PRAPARE - Administrator, Civil Service (Medical): No    Lack of Transportation (Non-Medical): No  Physical Activity: Insufficiently Active (02/06/2024)   Exercise Vital Sign    Days of Exercise per Week: 3 days    Minutes of Exercise per Session: 40 min  Stress: No Stress Concern Present (02/06/2024)   [name] Stress Questionnaire    Feeling of Stress:  Only a little  Social Connections: Unknown (02/06/2024)   Social Connection and Isolation Panel    Frequency of Communication with Friends and Family: Twice a week    Frequency of Social Gatherings with Friends and Family: Patient declined    Attends Religious Services: More than 4 times per year    Active Member of [employer] or Organizations: No    Attends Engineer, structural: Not on file    Marital Status: Married   Allergies  Allergen Reactions   Amoxicillin Diarrhea   Morphine And Codeine Nausea And Vomiting   Family History  Problem Relation Age of Onset   Alcohol  abuse Mother    Depression Mother    Diabetes Mother    Alcohol  abuse Father    COPD Father    Cancer Sister     Hypertension Brother    Migraines Daughter    Multiple sclerosis Daughter       Current Outpatient Medications (Cardiovascular):    carvedilol  (COREG ) 25 MG tablet, Take 1 tablet (25 mg total) by mouth 2 (two) times daily with a meal.   rosuvastatin  (CRESTOR ) 20 MG tablet, Take 1 tablet (20 mg total) by mouth daily.   Study - VICTORION-1 PREVENT - inclisiran 300 mg/1.4mL or placebo SQ injection (PI-Stuckey), Inject 300 mg into the skin every 6 (six) months. For Investigational Use Only. Inject 1.5 mL subcutaneously into the abdomen, upper arm or thigh. Do not inject into areas of active skin disease, such as sunburns, skin rashes, inflammation, or skin infections. Administered on day 1, day 90, and every 6 months thereafter. Please contact [facility] for any questions or concerns regarding this medication.   valsartan  (DIOVAN ) 160 MG tablet, TAKE 1 TABLET DAILY  Current Facility-Administered Medications (Cardiovascular):    Study - VICTORION-1 PREVENT - inclisiran 300 mg/1.5mL or placebo SQ injection (PI-Stuckey)  Current Outpatient Medications (Respiratory):    albuterol  (VENTOLIN  HFA) 108 (90 Base) MCG/ACT inhaler, Inhale 2 puffs into the lungs every 6 (six) hours as needed for wheezing or shortness of breath.   fluticasone  (FLONASE ) 50 MCG/ACT nasal spray, Place 2 sprays into both nostrils daily.   WIXELA INHUB 250-50 MCG/ACT AEPB, USE 1 INHALATION TWICE A DAY   Current Outpatient Medications (Analgesics):    aspirin EC 81 MG tablet, Take 81 mg by mouth daily. Swallow whole.   ibuprofen  (ADVIL ) 600 MG tablet, Take 600 mg by mouth every 6 (six) hours as needed.     Current Outpatient Medications (Other):    amitriptyline  (ELAVIL ) 25 MG tablet, TAKE 1 TABLET AT BEDTIME   atropine 1 % ophthalmic solution, Place 1 drop into the right eye daily.   cholecalciferol (VITAMIN D3) 25 MCG (1000 UNIT) tablet, Take 1,000 Units by mouth daily.   finasteride   (PROSCAR ) 5 MG tablet, TAKE 1 TABLET DAILY   methocarbamol  (ROBAXIN ) 500 MG tablet, Take by mouth every 6 (six) hours as needed.   Multiple Vitamin (MULTIVITAMIN) tablet, Take 1 tablet by mouth daily.   omeprazole  (PRILOSEC) 40 MG capsule, TAKE 1 CAPSULE DAILY   tamsulosin  (FLOMAX ) 0.4 MG CAPS capsule, TAKE 2 CAPSULES DAILY AFTER BREAKFAST    Reviewed prior external information including notes and imaging from  primary care provider As well as notes that were available from care everywhere and other healthcare systems.  Past medical history, social, surgical and family history all reviewed in electronic medical record.  No pertanent information unless stated regarding to the chief complaint.   Review of Systems:  No headache, visual changes, nausea, vomiting, diarrhea, constipation, dizziness, abdominal pain, skin rash, fevers, chills, night sweats, weight loss, swollen lymph nodes, body aches, joint swelling, chest pain, shortness of breath, mood changes. POSITIVE muscle aches  Objective  Blood pressure 106/72, pulse 84, height 5' 6 (1.676 m), weight 182 lb (82.6 kg), SpO2 98%.   General: No apparent distress alert and oriented x3 mood and affect normal, dressed appropriately.  HEENT: Pupils equal, extraocular movements intact  Respiratory: Patient's speak in full sentences and does not appear short of breath  Cardiovascular: No lower extremity edema, non tender, no erythema   Left knee does have some lateral tracking of the patella noted.  Tender to palpation more over the lateral joint line.  No significant instability noted noted of the knee.  Has full flexion and   Impression and Recommendations:     The above documentation has been reviewed and is accurate and complete Aneka Fagerstrom M Carlee Tesfaye, DO

## 2024-03-14 ENCOUNTER — Ambulatory Visit: Admitting: Family Medicine

## 2024-03-14 ENCOUNTER — Encounter: Payer: Self-pay | Admitting: Family Medicine

## 2024-03-14 ENCOUNTER — Other Ambulatory Visit: Payer: Self-pay | Admitting: Cardiology

## 2024-03-14 ENCOUNTER — Ambulatory Visit

## 2024-03-14 VITALS — BP 106/72 | HR 84 | Ht 66.0 in | Wt 182.0 lb

## 2024-03-14 DIAGNOSIS — M25562 Pain in left knee: Secondary | ICD-10-CM

## 2024-03-14 DIAGNOSIS — M1712 Unilateral primary osteoarthritis, left knee: Secondary | ICD-10-CM | POA: Diagnosis not present

## 2024-03-14 DIAGNOSIS — M7652 Patellar tendinitis, left knee: Secondary | ICD-10-CM | POA: Diagnosis not present

## 2024-03-14 NOTE — Assessment & Plan Note (Signed)
 Appears to be primary arthritis of the knee.  X-rays are pending.  Seems to be more of the lateral compartment.  Discussed icing regimen and home exercises, discussed which activities to do and which ones to avoid.  Worsening pain consider injection.  Patient has done formal physical therapy a significant amount already for the left hip arthritis.  Still wants to avoid any surgical intervention.  Follow-up again in 6 to 8 weeks

## 2024-03-14 NOTE — Patient Instructions (Signed)
 Lateral unloader Exercises Ice after activity See me in 2 months

## 2024-03-17 ENCOUNTER — Ambulatory Visit: Payer: Self-pay | Admitting: Family Medicine

## 2024-04-08 DIAGNOSIS — H524 Presbyopia: Secondary | ICD-10-CM | POA: Diagnosis not present

## 2024-04-08 DIAGNOSIS — H52223 Regular astigmatism, bilateral: Secondary | ICD-10-CM | POA: Diagnosis not present

## 2024-04-08 DIAGNOSIS — H35033 Hypertensive retinopathy, bilateral: Secondary | ICD-10-CM | POA: Diagnosis not present

## 2024-04-08 DIAGNOSIS — H18593 Other hereditary corneal dystrophies, bilateral: Secondary | ICD-10-CM | POA: Diagnosis not present

## 2024-04-08 DIAGNOSIS — H5213 Myopia, bilateral: Secondary | ICD-10-CM | POA: Diagnosis not present

## 2024-04-08 DIAGNOSIS — Z961 Presence of intraocular lens: Secondary | ICD-10-CM | POA: Diagnosis not present

## 2024-04-12 NOTE — Progress Notes (Unsigned)
 [facility] [address] [place] [address] Phone: [phone] Subjective:   Jon[facility] Johnson, am serving as a scribe for Dr. Arthea Claudene.  I'm seeing this patient by the request  of:  Berneta Elsie Sayre, MD  CC: left knee pain   YEP:Dlagzrupcz  03/14/2024 Appears to be primary arthritis of the knee.  X-rays are pending.  Seems to be more of the lateral compartment.  Discussed icing regimen and home exercises, discussed which activities to do and which ones to avoid.  Worsening pain consider injection.  Patient has done formal physical therapy a significant amount already for the left hip arthritis.  Still wants to avoid any surgical intervention.  Follow-up again in 6 to 8 weeks     Updated 04/14/2024 Jonathon Johnson is a 78 y.o. male coming in with complaint of L knee pain. Patient states that his knee is stiff. Takes a few steps to get going and then pain dissipates.  Overall nothing severe.      Past Medical History:  Diagnosis Date   Asthma    Chicken pox    GERD (gastroesophageal reflux disease)    Hyperlipidemia 11/04/2016   Hypertension 07/19/2018   Past Surgical History:  Procedure Laterality Date   CATARACT EXTRACTION Bilateral    02/2023   CHOLECYSTECTOMY     TRANSFORAMINAL LUMBAR INTERBODY FUSION (TLIF) WITH PEDICLE SCREW FIXATION 1 LEVEL Right 09/25/2021   Procedure: RIGHT-SIDED TRANSFORAMINAL LUMBAR INTERBODY FUSION AND DECOMPRESSION LUMBAR 4-  LUMBAR 5 WITH INSTRUMENTATION AND ALLOGRAFT;  Surgeon: Beuford Anes, MD;  Location: [facility] OR;  Service: Orthopedics;  Laterality: Right;   Social History   Socioeconomic History   Marital status: Married    Spouse name: Not on file   Number of children: Not on file   Years of education: Not on file   Highest education level: Associate degree: academic program  Occupational History   Not on file  Tobacco Use   Smoking status: Former    Current packs/day: 0.00    Types:  Cigarettes    Quit date: 07/19/2010    Years since quitting: 13.7   Smokeless tobacco: Never  Vaping Use   Vaping status: Never Used  Substance and Sexual Activity   Alcohol  use: Yes    Alcohol /week: 14.0 standard drinks of alcohol     Types: 14 Glasses of wine per week    Comment: 3-4 glasses of wine daily   Drug use: Never   Sexual activity: Yes    Partners: Female  Other Topics Concern   Not on file  Social History Narrative   Right handed   Social Drivers of Health   Financial Resource Strain: Low Risk  (02/06/2024)   Overall Financial Resource Strain (CARDIA)    Difficulty of Paying Living Expenses: Not hard at all  Food Insecurity: No Food Insecurity (02/06/2024)   Hunger Vital Sign    Worried About Running Out of Food in the Last Year: Never true    Ran Out of Food in the Last Year: Never true  Transportation Needs: No Transportation Needs (02/06/2024)   PRAPARE - Administrator, Civil Service (Medical): No    Lack of Transportation (Non-Medical): No  Physical Activity: Insufficiently Active (02/06/2024)   Exercise Vital Sign    Days of Exercise per Week: 3 days    Minutes of Exercise per Session: 40 min  Stress: No Stress Concern Present (02/06/2024)   [name]  Stress Questionnaire    Feeling of Stress: Only a little  Social Connections: Unknown (02/06/2024)   Social Connection and Isolation Panel    Frequency of Communication with Friends and Family: Twice a week    Frequency of Social Gatherings with Friends and Family: Patient declined    Attends Religious Services: More than 4 times per year    Active Member of [employer] or Organizations: No    Attends Engineer, Structural: Not on file    Marital Status: Married   Allergies  Allergen Reactions   Amoxicillin Diarrhea   Morphine And Codeine Nausea And Vomiting   Family History  Problem Relation Age of Onset   Alcohol  abuse Mother    Depression  Mother    Diabetes Mother    Alcohol  abuse Father    COPD Father    Cancer Sister    Hypertension Brother    Migraines Daughter    Multiple sclerosis Daughter       Current Outpatient Medications (Cardiovascular):    carvedilol  (COREG ) 25 MG tablet, Take 1 tablet (25 mg total) by mouth 2 (two) times daily with a meal.   rosuvastatin  (CRESTOR ) 20 MG tablet, Take 1 tablet (20 mg total) by mouth daily.   Study - VICTORION-1 PREVENT - inclisiran 300 mg/1.61mL or placebo SQ injection (PI-Stuckey), Inject 300 mg into the skin every 6 (six) months. For Investigational Use Only. Inject 1.5 mL subcutaneously into the abdomen, upper arm or thigh. Do not inject into areas of active skin disease, such as sunburns, skin rashes, inflammation, or skin infections. Administered on day 1, day 90, and every 6 months thereafter. Please contact [facility] for any questions or concerns regarding this medication.   valsartan  (DIOVAN ) 160 MG tablet, Take 1 tablet (160 mg total) by mouth daily.  Current Facility-Administered Medications (Cardiovascular):    Study - VICTORION-1 PREVENT - inclisiran 300 mg/1.52mL or placebo SQ injection (PI-Stuckey)  Current Outpatient Medications (Respiratory):    albuterol  (VENTOLIN  HFA) 108 (90 Base) MCG/ACT inhaler, Inhale 2 puffs into the lungs every 6 (six) hours as needed for wheezing or shortness of breath.   fluticasone  (FLONASE ) 50 MCG/ACT nasal spray, Place 2 sprays into both nostrils daily.   WIXELA INHUB 250-50 MCG/ACT AEPB, USE 1 INHALATION TWICE A DAY   Current Outpatient Medications (Analgesics):    aspirin EC 81 MG tablet, Take 81 mg by mouth daily. Swallow whole.   ibuprofen  (ADVIL ) 600 MG tablet, Take 600 mg by mouth every 6 (six) hours as needed.     Current Outpatient Medications (Other):    amitriptyline  (ELAVIL ) 25 MG tablet, TAKE 1 TABLET AT BEDTIME   atropine 1 % ophthalmic solution, Place 1 drop into the right eye  daily.   cholecalciferol (VITAMIN D3) 25 MCG (1000 UNIT) tablet, Take 1,000 Units by mouth daily.   finasteride  (PROSCAR ) 5 MG tablet, TAKE 1 TABLET DAILY   methocarbamol  (ROBAXIN ) 500 MG tablet, Take by mouth every 6 (six) hours as needed.   Multiple Vitamin (MULTIVITAMIN) tablet, Take 1 tablet by mouth daily.   omeprazole  (PRILOSEC) 40 MG capsule, TAKE 1 CAPSULE DAILY   tamsulosin  (FLOMAX ) 0.4 MG CAPS capsule, TAKE 2 CAPSULES DAILY AFTER BREAKFAST    Objective  Blood pressure (!) 142/98, pulse (!) 49, height 5' 9 (1.753 m), weight 185 lb (83.9 kg), SpO2 95%.   General: No apparent distress alert and oriented x3 mood and affect normal, dressed appropriately.  HEENT: Pupils equal, extraocular movements intact  Respiratory: Patient's speak in full sentences and does not appear short of breath  Cardiovascular: No lower extremity edema, non tender, no erythema  Knee does have some crepitus noted.  Mild instability noted.  Able to have full range of motion though noted.  Able to get up from a seated position without any difficulty.   Impression and Recommendations:     The above documentation has been reviewed and is accurate and complete Romano Stigger M Blasa Raisch, DO

## 2024-04-14 ENCOUNTER — Ambulatory Visit: Admitting: Family Medicine

## 2024-04-14 VITALS — BP 142/98 | HR 49 | Ht 69.0 in | Wt 185.0 lb

## 2024-04-14 DIAGNOSIS — M1712 Unilateral primary osteoarthritis, left knee: Secondary | ICD-10-CM | POA: Diagnosis not present

## 2024-04-14 NOTE — Patient Instructions (Signed)
 Good to see you See me in 3-4 months if you need me

## 2024-04-14 NOTE — Assessment & Plan Note (Signed)
 Patient is doing remarkably well at this time.  Controlling wound very well.  Discussed icing regimen and home exercises, discussed which activities to do and which ones to avoid.  Discussed the different treatment options including steroid injections, viscosupplementation.  Do not think patient will need a knee replacement anytime in the near future.  Follow-up with me again in 3 to 4 months

## 2024-04-27 DIAGNOSIS — R3914 Feeling of incomplete bladder emptying: Secondary | ICD-10-CM | POA: Diagnosis not present

## 2024-04-27 DIAGNOSIS — R351 Nocturia: Secondary | ICD-10-CM | POA: Diagnosis not present

## 2024-04-27 DIAGNOSIS — N401 Enlarged prostate with lower urinary tract symptoms: Secondary | ICD-10-CM | POA: Diagnosis not present

## 2024-04-27 DIAGNOSIS — R350 Frequency of micturition: Secondary | ICD-10-CM | POA: Diagnosis not present

## 2024-06-08 ENCOUNTER — Other Ambulatory Visit: Payer: Self-pay | Admitting: Family Medicine

## 2024-06-08 DIAGNOSIS — R059 Cough, unspecified: Secondary | ICD-10-CM

## 2024-06-08 DIAGNOSIS — K219 Gastro-esophageal reflux disease without esophagitis: Secondary | ICD-10-CM

## 2024-08-08 ENCOUNTER — Ambulatory Visit: Payer: Medicare Other

## 2024-08-09 ENCOUNTER — Ambulatory Visit: Admitting: Family Medicine

## 2024-08-29 ENCOUNTER — Encounter
